# Patient Record
Sex: Female | Born: 1970 | Race: White | Hispanic: No | Smoking: Current every day smoker
Health system: Southern US, Community
[De-identification: ages and names within clinical notes are randomized; demographics above are authoritative.]

## PROBLEM LIST (undated history)

## (undated) DIAGNOSIS — F410 Panic disorder [episodic paroxysmal anxiety] without agoraphobia: Secondary | ICD-10-CM

## (undated) DIAGNOSIS — N809 Endometriosis, unspecified: Secondary | ICD-10-CM

## (undated) DIAGNOSIS — C801 Malignant (primary) neoplasm, unspecified: Secondary | ICD-10-CM

## (undated) DIAGNOSIS — K219 Gastro-esophageal reflux disease without esophagitis: Secondary | ICD-10-CM

## (undated) DIAGNOSIS — D649 Anemia, unspecified: Secondary | ICD-10-CM

## (undated) DIAGNOSIS — F32A Depression, unspecified: Secondary | ICD-10-CM

## (undated) DIAGNOSIS — Z90710 Acquired absence of both cervix and uterus: Secondary | ICD-10-CM

## (undated) DIAGNOSIS — L659 Nonscarring hair loss, unspecified: Secondary | ICD-10-CM

## (undated) DIAGNOSIS — F419 Anxiety disorder, unspecified: Secondary | ICD-10-CM

## (undated) DIAGNOSIS — K529 Noninfective gastroenteritis and colitis, unspecified: Secondary | ICD-10-CM

## (undated) DIAGNOSIS — F329 Major depressive disorder, single episode, unspecified: Secondary | ICD-10-CM

## (undated) DIAGNOSIS — C539 Malignant neoplasm of cervix uteri, unspecified: Secondary | ICD-10-CM

## (undated) HISTORY — PX: DILATION AND CURETTAGE OF UTERUS: SHX78

## (undated) HISTORY — DX: Noninfective gastroenteritis and colitis, unspecified: K52.9

## (undated) HISTORY — DX: Anemia, unspecified: D64.9

## (undated) HISTORY — DX: Endometriosis, unspecified: N80.9

## (undated) HISTORY — PX: OTHER SURGICAL HISTORY: SHX169

## (undated) HISTORY — DX: Depression, unspecified: F32.A

## (undated) HISTORY — DX: Malignant neoplasm of cervix uteri, unspecified: C53.9

## (undated) HISTORY — DX: Malignant (primary) neoplasm, unspecified: C80.1

## (undated) HISTORY — DX: Nonscarring hair loss, unspecified: L65.9

## (undated) HISTORY — PX: WISDOM TOOTH EXTRACTION: SHX21

## (undated) HISTORY — DX: Anxiety disorder, unspecified: F41.9

## (undated) HISTORY — DX: Gastro-esophageal reflux disease without esophagitis: K21.9

## (undated) HISTORY — DX: Acquired absence of both cervix and uterus: Z90.710

## (undated) HISTORY — DX: Panic disorder (episodic paroxysmal anxiety): F41.0

---
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
## 2009-05-06 ENCOUNTER — Ambulatory Visit: Payer: Self-pay | Admitting: Family Medicine

## 2009-05-06 DIAGNOSIS — J019 Acute sinusitis, unspecified: Secondary | ICD-10-CM | POA: Insufficient documentation

## 2009-05-06 DIAGNOSIS — R413 Other amnesia: Secondary | ICD-10-CM

## 2009-08-30 ENCOUNTER — Telehealth: Payer: Self-pay | Admitting: Family Medicine

## 2009-11-06 ENCOUNTER — Ambulatory Visit: Payer: Self-pay | Admitting: Family Medicine

## 2009-11-06 DIAGNOSIS — J069 Acute upper respiratory infection, unspecified: Secondary | ICD-10-CM | POA: Insufficient documentation

## 2009-11-07 ENCOUNTER — Encounter: Payer: Self-pay | Admitting: Family Medicine

## 2009-11-10 ENCOUNTER — Ambulatory Visit: Payer: Self-pay | Admitting: Emergency Medicine

## 2009-11-10 DIAGNOSIS — J209 Acute bronchitis, unspecified: Secondary | ICD-10-CM

## 2009-11-10 DIAGNOSIS — J45901 Unspecified asthma with (acute) exacerbation: Secondary | ICD-10-CM | POA: Insufficient documentation

## 2010-06-27 NOTE — Assessment & Plan Note (Signed)
Summary: COugh-dry, SOB, wheezing x 5 dys rm 3   Vital Signs:  Patient Profile:   40 Years Old Female CC:      Pt states she is not feeling any better x 5 dys Height:     64 inches Weight:      211 pounds O2 Sat:      95 % O2 treatment:    Room Air Temp:     98.2 degrees F oral Pulse rate:   95 / minute Pulse rhythm:   regular Resp:     15 per minute BP sitting:   128 / 81  (right arm) Cuff size:   regular  Vitals Entered By: Areta Haber CMA (November 10, 2009 11:03 AM)                  Current Allergies: ! AVELOX (MOXIFLOXACIN HCL)    History of Present Illness Chief Complaint: Pt states she is not feeling any better x 5 dys History of Present Illness: Patient is not feeling better after 5 days and perhaps worse.  +Sick contacts (son is also sick).  Rx for Prednisone, Zpak, and Advair.  SOB and wheezing and dry cough.  No fever, chills, N/V.  H/o asthma and using rescue inhaler occasionally.   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  Take PC AZITHROMYCIN 250 MG TABS (AZITHROMYCIN) Two tabs by mouth on day 1, then 1 tab daily on days 2 through 5 BENZONATATE 200 MG CAPS (BENZONATATE) One by mouth hs as needed cough  REVIEW OF SYSTEMS Constitutional Symptoms       Complains of fatigue.     Denies fever, chills, night sweats, weight loss, and weight gain.  Eyes       Denies change in vision, eye pain, eye discharge, glasses, contact lenses, and eye surgery. Ear/Nose/Throat/Mouth       Denies hearing loss/aids, change in hearing, ear pain, ear discharge, dizziness, frequent runny nose, frequent nose bleeds, sinus problems, sore  throat, hoarseness, and tooth pain or bleeding.  Respiratory       Complains of dry cough, wheezing, and shortness of breath.      Denies productive cough, asthma, bronchitis, and emphysema/COPD.  Cardiovascular       Denies murmurs, chest pain, and tires easily with exhertion.    Gastrointestinal       Denies stomach pain, nausea/vomiting, diarrhea, constipation, blood in bowel movements, and indigestion. Genitourniary       Denies painful urination, kidney stones, and loss of urinary control. Neurological       Denies paralysis, seizures, and fainting/blackouts. Musculoskeletal       Denies muscle pain, joint pain, joint stiffness, decreased range of motion, redness, swelling, muscle weakness, and gout.  Skin       Denies bruising, unusual mles/lumps or sores, and hair/skin or nail changes.  Psych       Denies mood changes, temper/anger issues, anxiety/stress, speech problems, depression, and sleep problems. Other Comments: x 5 dys. Pt does not have PCP.   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   Non smoker ETOH-yes No Drugs Physical Exam General appearance: well developed, well nourished, no acute distress Ears: normal, no lesions or deformities Nasal: mucosa pink, nonedematous, no septal deviation, turbinates normal Oral/Pharynx: tongue normal, posterior pharynx without erythema or exudate but with PND Chest/Lungs: Bilateral generalized rhonchi and wheezing Heart: regular rate and  rhythm, no murmur Neurological: grossly intact and non-focal Skin: no obvious rashes or lesions Assessment New Problems: ACUTE BRONCHITIS (ICD-466.0) ASTHMA UNSPECIFIED WITH EXACERBATION  (UXL-244.01)   The patient and/or caregiver has been counseled thoroughly with regard to medications prescribed including dosage, schedule, interactions, rationale for use, and possible side effects and they verbalize understanding.  Diagnoses and expected course of recovery discussed and will return if not improved as expected or if the condition worsens. Patient and/or caregiver verbalized understanding.   Patient Instructions: 1)  Suggest Mucinex two times a day to help loosen secretions.  Rescue albuterol inhaler every 4-6 hours as needed.  Rest and hydration encouraged.  If not improving or if problem recurs, will need to see pulmonologist to maximize asthma treatment.  If you develop fevers and/or worsening symptoms, return for a chest Xray.  Complete all medicines previously prescribed.  Medication Administration  Injection # 1:    Medication: Rocephin  250mg     Diagnosis: ACUTE BRONCHITIS (ICD-466.0)    Route: IM    Site: RUOQ gluteus    Exp Date: 06/27/2012    Lot #: [id number]    Mfr: Sandoux    Comments: Administered 1 gram    Patient tolerated injection without complications    Given by: Areta Haber CMA (November 10, 2009 12:03 PM)  Injection # 2:    Medication: Solumedrol up to 125mg     Diagnosis: ACUTE BRONCHITIS (ICD-466.0)    Route: IM    Site: LUOQ gluteus    Exp Date: 02/25/2012    Lot #: [id number]    Mfr: Pharmacia    Comments: Administered 125mg     Patient tolerated injection without complications    Given by: Areta Haber CMA (November 10, 2009 12:04 PM)  Medication # 1:    Medication: Albuterol Sulfate Sol 1mg  unit dose    Diagnosis: URI (ICD-465.9)    Dose: 0.5    Route: inhaled    Exp Date: 07/27/2010    Lot #: [id number]    Mfr: Mylan    Patient tolerated medication without complications    Given by: Emilio Math (November 10, 2009 11:24 AM)  Medication # 2:    Medication: Ipratropium inhalation sol. unit dose    Diagnosis: URI (ICD-465.9)    Dose: 3.0     Route: inhaled    Exp Date: 07/27/2010    Lot #: [id number]    Mfr: Mylan    Given by: Emilio Math (November 10, 2009 11:24 AM)  Orders Added: 1)  Albuterol Sulfate Sol 1mg  unit dose [H4742] 2)  Ipratropium inhalation sol. unit dose [J7644] 3)  Nebulizer Tx [94640] 4)  New Patient Level III [99203] 5)  New Patient Level III [99203] 6)  Rocephin  250mg  [J0696] 7)  Solumedrol up to 125mg  [J2930] 8)  Admin of Therapeutic Inj  intramuscular or subcutaneous [96372]   Medication Administration  Injection # 1:    Medication: Rocephin  250mg     Diagnosis: ACUTE BRONCHITIS (ICD-466.0)    Route: IM    Site: RUOQ gluteus    Exp Date:  06/27/2012    Lot #: [id number]    Mfr: Sandoux    Comments: Administered 1 gram    Patient tolerated injection without complications    Given by: Areta Haber CMA (November 10, 2009 12:03 PM)  Injection # 2:    Medication: Solumedrol up to 125mg     Diagnosis: ACUTE BRONCHITIS (ICD-466.0)    Route: IM    Site: LUOQ gluteus    Exp Date: 02/25/2012    Lot #: [id number]    Mfr: Pharmacia    Comments: Administered 125mg     Patient tolerated injection without complications    Given by: Areta Haber CMA (November 10, 2009 12:04 PM)  Medication # 1:    Medication: Albuterol Sulfate Sol 1mg  unit dose    Diagnosis: URI (ICD-465.9)    Dose: 0.5    Route: inhaled    Exp Date: 07/27/2010    Lot #: [id number]    Mfr: Mylan    Patient tolerated medication without complications    Given by: Emilio Math (November 10, 2009 11:24 AM)  Medication # 2:    Medication: Ipratropium inhalation sol. unit dose    Diagnosis: URI (ICD-465.9)    Dose: 3.0    Route: inhaled    Exp Date: 07/27/2010    Lot #: [id number]    Mfr: Mylan    Given by: Emilio Math (November 10, 2009 11:24 AM)  Orders Added: 1)  Albuterol Sulfate Sol 1mg  unit dose [J4782] 2)  Ipratropium inhalation sol. unit dose [J7644] 3)  Nebulizer Tx [94640] 4)  New Patient Level III [99203] 5)  New Patient Level III  [99203] 6)  Rocephin  250mg  [J0696] 7)  Solumedrol up to 125mg  [J2930] 8)  Admin of Therapeutic Inj  intramuscular or subcutaneous [95621]

## 2010-06-27 NOTE — Assessment & Plan Note (Signed)
Summary: COUGH/CONGESTION/TM   Vital Signs:  Patient Profile:   40 Years Old Female CC:      Cough, congestion x 2 days Height:     64 inches Weight:      204 pounds O2 Sat:      98 % O2 treatment:    Room Air Temp:     98.8 degrees F oral Pulse rate:   94 / minute Pulse rhythm:   regular Resp:     16 per minute BP sitting:   140 / 82  (right arm) Cuff size:   regular  Vitals Entered By: Emilio Math (November 06, 2009 2:32 PM)                  Current Allergies (reviewed today): ! AVELOX (MOXIFLOXACIN HCL)History of Present Illness Chief Complaint: Cough, congestion x 2 days History of Present Illness: Subjective: Patient complains of onset of URI symptoms 2 days ago with scratchy throat, nasal congestion, and non-productive cough.  She has a history of mild seasonal asthma, and has had several episodes of bronchitis in the past.  She has Advair at home but not presently using it.   No pleuritic pain No wheezing + post-nasal drainage ? sinus pain/pressure No itchy/red eyes No earache but ears feel clogged. No hemoptysis ? SOB ? fever/chills No nausea No vomiting No abdominal pain No diarrhea No skin rashes + fatigue No myalgias No headache Used OTC meds without relief   Current Meds ADVAIR DISKUS 100-50 MCG/DOSE MISC (FLUTICASONE-SALMETEROL) 1 inhalation two times a day PREDNISONE 10 MG TABS (PREDNISONE) 2 PO BID for 2 days, then 1 BID for 2 days, then 1 daily for 2 days.  Take PC AZITHROMYCIN 250 MG TABS (AZITHROMYCIN) Two tabs by mouth on day 1, then 1 tab daily on days 2 through 5 BENZONATATE 200 MG CAPS (BENZONATATE) One by mouth hs as needed cough  REVIEW OF SYSTEMS Constitutional Symptoms      Denies fever, chills, night sweats, weight loss, weight gain, and fatigue.  Eyes       Denies change in vision, eye pain, eye discharge, glasses, contact lenses, and eye surgery. Ear/Nose/Throat/Mouth       Complains of sinus problems.      Denies hearing  loss/aids, change in hearing, ear pain, ear discharge, dizziness, frequent runny nose, frequent nose bleeds, sore throat, hoarseness, and tooth pain or bleeding.  Respiratory       Complains of productive cough and asthma.      Denies dry cough, wheezing, shortness of breath, bronchitis, and emphysema/COPD.  Cardiovascular       Denies murmurs, chest pain, and tires easily with exhertion.    Gastrointestinal       Denies stomach pain, nausea/vomiting, diarrhea, constipation, blood in bowel movements, and indigestion. Genitourniary       Denies painful urination, kidney stones, and loss of urinary control. Neurological       Denies paralysis, seizures, and fainting/blackouts. Musculoskeletal       Denies muscle pain, joint pain, joint stiffness, decreased range of motion, redness, swelling, muscle weakness, and gout.  Skin       Denies bruising, unusual mles/lumps or sores, and hair/skin or nail changes.  Psych       Denies mood changes, temper/anger issues, anxiety/stress, speech problems, depression, and sleep problems.  Past History:  Past Medical History: Reviewed history from 05/06/2009 and no changes required. 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   Non smoker ETOH-yes No Drugs   Objective:  No acute distress  Eyes:  Pupils are equal, round, and reactive to light and accomdation.  Extraocular movement is intact.  Conjunctivae are not inflamed.  Ears:  Canals normal.  Tympanic membranes normal.   Nose:  Normal septum.  Normal turbinates, mildly congested.   No sinus tenderness present.  Pharynx:  Mildly erythematous Neck:  Supple.   Tender shotty anterior  nodes are palpated bilaterally.  Lungs:  Clear to auscultation.  Breath sounds are equal. j Heart:  Regular rate and rhythm without murmurs, rubs, or gallops.    Abdomen:  Nontender without masses or hepatosplenomegaly.  Bowel sounds are present.  No CVA or flank tenderness.  Extremities:  No edema.  Skin:  No rash Rapid strep test negative  Assessment New Problems: URI (ICD-465.9)  VIRAL URI WITH BRONCHOSPASM.  NO EVIDENCE BACTERIAL INFECTION AT THIS TIME  Plan New Medications/Changes: BENZONATATE 200 MG CAPS (BENZONATATE) One by mouth hs as needed cough  #12 x 0, 11/06/2009, Donna Christen MD AZITHROMYCIN 250 MG TABS (AZITHROMYCIN) Two tabs by mouth on day 1, then 1 tab daily on days 2 through 5  #6 tabs x 0, 11/06/2009, Donna Christen MD PREDNISONE 10 MG TABS (PREDNISONE) 2 PO BID for 2 days, then 1 BID for 2 days, then 1 daily for 2 days.  Take PC  #14 x 0, 11/06/2009, Donna Christen MD ADVAIR DISKUS 100-50 MCG/DOSE MISC (FLUTICASONE-SALMETEROL) 1 inhalation two times a day  #1 disp pk 60 x 0, 11/06/2009, Donna Christen MD  New Orders: Rapid Strep [14782] T-Culture, Throat [[id number]] New Patient Level III [99203] Planning Comments:   Throat culture pending. Treat symptomatically for now:  expectorant/decongestant, cough suppressant at bedtime, increased fluids. DepoMedrol 80mg  IM, then begin prednisone taper. Resume Advair and continue for about 2 weeks. If not improving, or if fever persists add Z-pack (Rx to hold.  Also begin Z-pack if throat culture positive) Follow-up with PCP if not improving about 10 days.   The patient and/or caregiver has been counseled thoroughly with regard to medications prescribed including dosage, schedule, interactions, rationale for use, and possible side effects and they verbalize understanding.  Diagnoses and expected course of recovery discussed and will return if not improved as expected or if the condition worsens. Patient and/or caregiver verbalized understanding.  Prescriptions: BENZONATATE 200 MG CAPS (BENZONATATE) One by mouth hs as needed cough  #12 x 0   Entered and Authorized by:   Donna Christen MD   Signed by:   Donna Christen MD on 11/06/2009   Method used:   Print then Give to Patient   RxID:   [id number] AZITHROMYCIN 250 MG TABS (AZITHROMYCIN) Two tabs by mouth on day 1, then 1 tab daily on days 2 through 5  #6 tabs x 0   Entered and Authorized by:   Donna Christen MD   Signed by:   Donna Christen MD on 11/06/2009   Method used:   Print then Give to Patient   RxID:   [id number] PREDNISONE 10 MG TABS (PREDNISONE) 2 PO BID for 2 days, then 1 BID for 2 days, then 1 daily for 2 days.  Take PC  #14 x 0   Entered and Authorized by:   Donna Christen MD   Signed by:   Donna Christen MD on 11/06/2009   Method used:   Print then  Give to Patient   RxID:   [id number] ADVAIR DISKUS 100-50 MCG/DOSE MISC (FLUTICASONE-SALMETEROL) 1 inhalation two times a day  #1 disp pk 60 x 0   Entered and Authorized by:   Donna Christen MD   Signed by:   Donna Christen MD on 11/06/2009   Method used:   Print then Give to Patient   RxID:   [id number]   Patient Instructions: 1)  May use Mucinex D (guaifenesin with decongestant) twice daily for congestion. 2)  Increase fluid intake, rest. 3)  Resume using Advair. 4)  May use Afrin nasal spray (or generic oxymetazoline) twice daily for about 5 days.  Also recommend using saline nasal spray several times daily and/or saline nasal irrigation. 5)  Add Azithromycin if not improving 5 to 7 days or if persistent fever developes 6)  Followup with family doctor if not improving 7 to 10 days.  Orders Added: 1)  Rapid Strep [87880] 2)  T-Culture, Throat [[id number]] 3)  New Patient Level III [01027]  Appended Document: COUGH/CONGESTION/TM Depo Medrol 80 mgs given 0A5P1 11/2010

## 2010-06-27 NOTE — Progress Notes (Signed)
Summary: UTI sxs  Phone Note Call from Patient   Summary of Call: BAck pain not feeling well. + dysuria. Not having period.  No fever.   Initial call taken by: Nani Gasser MD,  August 30, 2009 1:40 PM  Follow-up for Phone Call        UA + for blood and LE, brown and cloudy. Will treat for UTI. If not better in 3 days then repeat UA and send a culture.  Follow-up by: Nani Gasser MD,  August 30, 2009 1:40 PM    New/Updated Medications: BACTRIM DS 800-160 MG TABS (SULFAMETHOXAZOLE-TRIMETHOPRIM) Take 1 tablet by mouth two times a day for 3 days Prescriptions: BACTRIM DS 800-160 MG TABS (SULFAMETHOXAZOLE-TRIMETHOPRIM) Take 1 tablet by mouth two times a day for 3 days  #6 x 0   Entered and Authorized by:   Nani Gasser MD   Signed by:   Nani Gasser MD on 08/30/2009   Method used:   Electronically to        [facility] [phone]* (retail)       [address] [place], [place]  [address]       Ph: [phone] or [phone]       Fax: [phone]   RxID:   [id number]

## 2010-08-27 ENCOUNTER — Encounter: Payer: Self-pay | Admitting: Family Medicine

## 2010-08-31 ENCOUNTER — Ambulatory Visit (INDEPENDENT_AMBULATORY_CARE_PROVIDER_SITE_OTHER): Payer: BC Managed Care – PPO | Admitting: Family Medicine

## 2010-08-31 ENCOUNTER — Encounter: Payer: Self-pay | Admitting: Family Medicine

## 2010-08-31 DIAGNOSIS — Z8349 Family history of other endocrine, nutritional and metabolic diseases: Secondary | ICD-10-CM

## 2010-08-31 DIAGNOSIS — Z862 Personal history of diseases of the blood and blood-forming organs and certain disorders involving the immune mechanism: Secondary | ICD-10-CM

## 2010-08-31 DIAGNOSIS — N809 Endometriosis, unspecified: Secondary | ICD-10-CM

## 2010-08-31 DIAGNOSIS — R5383 Other fatigue: Secondary | ICD-10-CM

## 2010-08-31 NOTE — Progress Notes (Signed)
  Subjective:    Patient ID: Virginia Logan, female    DOB: 1971-04-07, 40 y.o.   MRN: [id number]  HPI She would like to be checked for diabetes.  Occ inc thirst and urination. + family hx of diabeties. No shakes or sweats.   She is very concerned about her weight.  No regular exercise.  Has gained about 20 lbs in the last year.  Thought she feels she is very active. Father with Graves dz.  She has been drinking more water and staying off the soft drink.  She is working a really long schedule.  She is very fatigued. She is working really long hours.  Was on zoloft for a long time but then felt like it was not working well. Now she is on pristiq and taht is working well for the most part.  She likes the pristiq better than the zoloft.    Does have a hx of heavy periods.  Hx of anemia as a teenager. She has been skipping dinner when she works and often eating only 2 x a day.    Review of Systems     Objective:   Physical Exam  Constitutional: She appears well-developed and well-nourished.  HENT:  Head: Normocephalic and atraumatic.  Eyes: Pupils are equal, round, and reactive to light.  Neck: Normal range of motion. No thyromegaly present.  Cardiovascular: Normal rate and regular rhythm.   Pulmonary/Chest: Effort normal and breath sounds normal.  Lymphadenopathy:    She has no cervical adenopathy.  Skin: Skin is warm and dry.  Psychiatric: She has a normal mood and affect.          Assessment & Plan:  Weight loss - Discussed regular exercise, healthy diet. REcommend one week food diary to evaluate how often and how many calories she is eating. I discussed the importance of 5 small meals a day and not skipping dinner. Also we can consider weight loss meds but discussed the pros and cons of this.   Fatigue - Will rule out anemia, thyroid d/o, etc. She is working really long hours which is likely contributing. Mood is fari. Consider may need an increase on her pristiq.

## 2010-08-31 NOTE — Patient Instructions (Signed)
We will call you with the lab results. 

## 2010-09-13 ENCOUNTER — Other Ambulatory Visit: Payer: Self-pay | Admitting: Family Medicine

## 2010-09-13 ENCOUNTER — Telehealth: Payer: Self-pay | Admitting: Family Medicine

## 2010-09-13 NOTE — Telephone Encounter (Signed)
Patient walk-in this morning request B-12 to be added to her lab order.

## 2010-09-13 NOTE — Telephone Encounter (Signed)
Message left for pt that B12 level was ordered by Dr. Linford Arnold at her last visit.  Pt should call if she has any further questions.

## 2010-09-14 ENCOUNTER — Telehealth: Payer: Self-pay | Admitting: Family Medicine

## 2010-09-14 LAB — COMPLETE METABOLIC PANEL WITH GFR
Albumin: 4.3 g/dL (ref 3.5–5.2)
Alkaline Phosphatase: 98 U/L (ref 39–117)
BUN: 15 mg/dL (ref 6–23)
CO2: 20 mEq/L (ref 19–32)
Calcium: 9.8 mg/dL (ref 8.4–10.5)
GFR, Est African American: >60 mL/min (ref >60)
GFR, Est Non African American: >60 mL/min (ref >60)
Glucose, Bld: 122 mg/dL — ABNORMAL HIGH (ref 70–99)
Potassium: 4.3 mEq/L (ref 3.5–5.3)
Sodium: 137 mEq/L (ref 135–145)
Total Protein: 7.2 g/dL (ref 6.0–8.3)

## 2010-09-14 LAB — CBC
MCHC: 32.8 g/dL (ref 30.0–36.0)
Platelets: 278 10*3/uL (ref 150–400)
RDW: 13.9 % (ref 11.5–15.5)
WBC: 7.9 10*3/uL (ref 4.0–10.5)

## 2010-09-14 LAB — VITAMIN B12: Vitamin B-12: 399 pg/mL (ref 211–911)

## 2010-09-14 LAB — VITAMIN D 25 HYDROXY (VIT D DEFICIENCY, FRACTURES): Vit D, 25-Hydroxy: 37 ng/mL (ref 30–89)

## 2010-09-14 LAB — IRON: Iron: 78 ug/dL (ref 42–145)

## 2010-09-14 NOTE — Telephone Encounter (Signed)
Call pt: all labs look great except sugar was elevated, but I dont' think she was fasting. Normal B12, no anemia, nl thyroid.  For weight loss recommend increase activity/exercise. No skipping meals.  We can always consider a wt loss med but that has to be in conjuntion with exercise and diet plan. I also think her long hours have increased her bodies normal cortisol levels adn this can cause wt gain.

## 2010-09-14 NOTE — Telephone Encounter (Signed)
Call and left message to call back for labs

## 2010-09-15 MED ORDER — PHENTERMINE HCL 37.5 MG PO CAPS: 37.5000 mg | ORAL_CAPSULE | ORAL | Status: DC

## 2010-09-15 NOTE — Telephone Encounter (Signed)
OK we will fax med to pharmacy. F/U in one month for weight check on the med and also so we can do an A1C to test for diabetes  (appt w/ MD).

## 2010-09-15 NOTE — Telephone Encounter (Signed)
Left message on pts cell

## 2010-09-15 NOTE — Telephone Encounter (Signed)
Pt notified and pt states she was fasting when she had labs done.Pt would like to start wt loss med along with diet plan

## 2010-10-19 ENCOUNTER — Other Ambulatory Visit (INDEPENDENT_AMBULATORY_CARE_PROVIDER_SITE_OTHER): Payer: BC Managed Care – PPO | Admitting: Family Medicine

## 2010-10-19 ENCOUNTER — Telehealth: Payer: Self-pay | Admitting: *Deleted

## 2010-10-19 DIAGNOSIS — E119 Type 2 diabetes mellitus without complications: Secondary | ICD-10-CM

## 2010-10-19 NOTE — Progress Notes (Signed)
  Subjective:    Patient ID: Virginia Logan, female    DOB: Aug 22, 1970, 40 y.o.   MRN: [id number]  HPI Overweight. Tryign to loose weight  Also here for A1C check since her labs revealed and abnormal glucose level.    Review of Systems     Objective:   Physical Exam        Assessment & Plan:  Ovwerweight - has lost 6 lbs which is fantastic.    Abnormal glucose - A1C is OK. Recheck fasting glucose in 6 months.

## 2010-10-19 NOTE — Telephone Encounter (Signed)
Pt was in the office this am and states pristiquw is too expensive for her. Pt was given samples today but she wanted to know if there was a cheaper alternative

## 2010-10-19 NOTE — Telephone Encounter (Signed)
Rep dropped off coupon cards that make it 15 a month. This is pretty good.  Can pick one up any time.

## 2010-10-20 NOTE — Telephone Encounter (Signed)
Pt was notified yesterday.

## 2010-10-24 ENCOUNTER — Other Ambulatory Visit: Payer: Self-pay | Admitting: *Deleted

## 2010-10-24 MED ORDER — PHENTERMINE HCL 37.5 MG PO CAPS: 37.5000 mg | ORAL_CAPSULE | ORAL | Status: DC

## 2010-10-25 ENCOUNTER — Telehealth: Payer: Self-pay | Admitting: Family Medicine

## 2010-10-25 NOTE — Telephone Encounter (Signed)
Left message on pt.'s vm.

## 2010-10-25 NOTE — Telephone Encounter (Signed)
Call pt: Please call patient. Normal mammogram.  Repeat in 1 year.

## 2010-11-27 ENCOUNTER — Ambulatory Visit (INDEPENDENT_AMBULATORY_CARE_PROVIDER_SITE_OTHER): Payer: BC Managed Care – PPO | Admitting: Family Medicine

## 2010-11-27 VITALS — BP 130/87 | HR 72 | Wt 199.0 lb

## 2010-11-27 DIAGNOSIS — E663 Overweight: Secondary | ICD-10-CM

## 2010-11-27 MED ORDER — PHENTERMINE HCL 37.5 MG PO CAPS: 37.5000 mg | ORAL_CAPSULE | ORAL | Status: DC

## 2010-11-27 NOTE — Progress Notes (Signed)
  Subjective:    Patient ID: Virginia Logan, female    DOB: 06-06-1970, 40 y.o.   MRN: [id number]  HPI Her to f/u weight loss.  Here for BP and wt check. Has only lost 3 lbs.     Review of Systems     Objective:   Physical Exam        Assessment & Plan:  OK to refill her phentermine.

## 2010-11-30 ENCOUNTER — Ambulatory Visit: Payer: BC Managed Care – PPO | Admitting: Family Medicine

## 2010-12-04 ENCOUNTER — Ambulatory Visit: Payer: BC Managed Care – PPO | Admitting: Family Medicine

## 2010-12-27 ENCOUNTER — Other Ambulatory Visit: Payer: Self-pay | Admitting: Family Medicine

## 2010-12-27 ENCOUNTER — Encounter: Payer: Self-pay | Admitting: Family Medicine

## 2010-12-27 ENCOUNTER — Ambulatory Visit (INDEPENDENT_AMBULATORY_CARE_PROVIDER_SITE_OTHER): Payer: BC Managed Care – PPO | Admitting: Family Medicine

## 2010-12-27 VITALS — BP 127/80 | HR 83 | Temp 98.4°F | Wt 196.0 lb

## 2010-12-27 DIAGNOSIS — R1031 Right lower quadrant pain: Secondary | ICD-10-CM

## 2010-12-27 LAB — CBC WITH DIFFERENTIAL/PLATELET
Hemoglobin: 15.7 g/dL — ABNORMAL HIGH (ref 12.0–15.0)
MCH: 29.2 pg (ref 26.0–34.0)
MCHC: 34 g/dL (ref 30.0–36.0)
Monocytes Relative: 3 % (ref 3–12)
Neutrophils Relative %: 64 % (ref 43–77)
RDW: 14.2 % (ref 11.5–15.5)

## 2010-12-27 LAB — COMPLETE METABOLIC PANEL WITH GFR
CO2: 27 mEq/L (ref 19–32)
Creat: 0.8 mg/dL (ref 0.50–1.10)
GFR, Est African American: >60 mL/min (ref >60)
GFR, Est Non African American: >60 mL/min (ref >60)
Glucose, Bld: 110 mg/dL — ABNORMAL HIGH (ref 70–99)
Total Bilirubin: 0.5 mg/dL (ref 0.3–1.2)

## 2010-12-27 NOTE — Progress Notes (Signed)
Subjective:    Patient ID: Virginia Logan, female    DOB: [birth year], 40 y.o.   MRN: [id number]  HPI  Patient says about 2 weeks ago she had an episode of diarrhea. It was after eating a salad that she thought might be contaminated she thought initially it might be food poisoning. She did get better but then starting last week she started having a discomfort in her right lower quadrant that she noticed when she would bend over while cleaning. Developed a sharp hurting sensation when she would bend forward. Otherwise it didn't bother her. Then over the last couple of days the pain has become more prominent and constant. She now rates her pain as 8/10. She also had recurrent diarrhea that started again yesterday with painful cramping. The cramping and the diarrhea have now resolved. She did use a medium. She has not had any vomiting or nausea but has had some chills and sweats. She's not sure she's had a true fever. Note she does have a family history in her brother of Crohn's disease. She denies any blood in her stool. She has not taken any other medications.  Review of Systems  BP 127/80  Pulse 83  Temp(Src) 98.4 F (36.9 C) (Oral)  Wt 196 lb (88.905 kg)    Allergies  Allergen Reactions  . Moxifloxacin     REACTION: Rash    Past Medical History  Diagnosis Date  . Anemia     Past Surgical History  Procedure Date  . Left ankle     History   Social History  . Marital Status: Married    Spouse Name: N/A    Number of Children: N/A  . Years of Education: N/A   Occupational History  . Not on file.   Social History Main Topics  . Smoking status: Current Everyday Smoker -- 0.3 packs/day    Types: Cigarettes  . Smokeless tobacco: Not on file  . Alcohol Use: Yes  . Drug Use: No  . Sexually Active: Not on file   Other Topics Concern  . Not on file   Social History Narrative  . No narrative on file    Family History  Problem Relation Age of Onset  . Diabetes Mother   .  Hyperlipidemia Mother   . Heart attack Mother   . Cancer Father     prostate  . Graves' disease Father   . Crohn's disease Brother     Virginia Logan does not currently have medications on file.     Objective:   Physical Exam  Constitutional: She is oriented to person, place, and time. She appears well-developed and well-nourished.  HENT:  Head: Normocephalic and atraumatic.  Cardiovascular: Normal rate, regular rhythm and normal heart sounds.   Pulmonary/Chest: Effort normal and breath sounds normal.  Abdominal: Soft. Bowel sounds are normal. She exhibits no distension and no mass. There is tenderness. There is no rebound and no guarding.       Tender in teh RLQ and teh LUQ midly.   Neurological: She is alert and oriented to person, place, and time.  Skin: Skin is warm and dry.  Psychiatric: She has a normal mood and affect.          Assessment & Plan:  RLQ pain - consider colitis, appendicitis, diverticulitis, versus a cyst on her ovary. She's not currently having any urinary symptoms. Typically with diverticulitis the pain is usually worse on the left. She does still have her appendix and her pain  is actually on the right and at the level of the umbilicus. We'll get a stat CBC today and set her up for a CT of the abdomen and pelvis with contrast. She declined an injection of Toradol for pain relief. She says she does plan to go to work today. She also does have a history of endometriosis which certainly could be contributing. I really want her to work on staying hydrated right now. We will also get a CMP to make sure there is no elevation of liver enzymes abnormalities in her electrolytes and she did have persistent severe diarrhea yesterday.

## 2010-12-28 ENCOUNTER — Telehealth: Payer: Self-pay | Admitting: Family Medicine

## 2010-12-28 ENCOUNTER — Ambulatory Visit: Payer: BC Managed Care – PPO

## 2010-12-28 NOTE — Telephone Encounter (Signed)
Called and left message on cell phone with the results of her CT scan. It was otherwise normal. She did have some kidney stones but they're nonobstructing and actually in the kidney which should not be causing any of her pain her problem. I wanted her to give me a call back if she still having pain so that we can set her up with a gastroenterologist as I still don't have a good cause for her discomfort.

## 2010-12-29 ENCOUNTER — Other Ambulatory Visit: Payer: Self-pay | Admitting: Family Medicine

## 2010-12-29 DIAGNOSIS — R1031 Right lower quadrant pain: Secondary | ICD-10-CM

## 2010-12-29 MED ORDER — PHENTERMINE HCL 37.5 MG PO CAPS: 37.5000 mg | ORAL_CAPSULE | ORAL | Status: DC

## 2010-12-29 NOTE — Progress Notes (Signed)
Comment discuss results with patient. I do recommend she see GI since she is overall better but is still having that right-sided pain in the same spot especially when she bends over she still noticing it. The she's no longer having diarrhea or stomach cramping. She does have a family history of Crohn's disease which usually is not hereditary but I would still like her to consider seeing GI.

## 2011-01-04 ENCOUNTER — Encounter: Payer: Self-pay | Admitting: Family Medicine

## 2011-01-30 ENCOUNTER — Telehealth: Payer: Self-pay | Admitting: *Deleted

## 2011-01-30 MED ORDER — SERTRALINE HCL 100 MG PO TABS: 100.0000 mg | ORAL_TABLET | Freq: Every day | ORAL | Status: DC

## 2011-01-30 NOTE — Telephone Encounter (Signed)
Pt calls and states she can not afford the Pristiq any longer as she does not have insurance and wanted to know if you could put her back on the Zoloft. Please send to her pharmacy

## 2011-01-30 NOTE — Telephone Encounter (Signed)
Will change to zoloft 

## 2011-09-01 ENCOUNTER — Other Ambulatory Visit: Payer: Self-pay | Admitting: Family Medicine

## 2011-10-25 ENCOUNTER — Other Ambulatory Visit: Payer: Self-pay | Admitting: *Deleted

## 2011-10-25 MED ORDER — SERTRALINE HCL 100 MG PO TABS: 100.0000 mg | ORAL_TABLET | Freq: Every day | ORAL | Status: DC

## 2011-12-10 ENCOUNTER — Other Ambulatory Visit: Payer: Self-pay | Admitting: Family Medicine

## 2012-03-11 ENCOUNTER — Other Ambulatory Visit: Payer: Self-pay | Admitting: Family Medicine

## 2012-03-11 NOTE — Telephone Encounter (Signed)
Must make appointment 

## 2012-04-11 ENCOUNTER — Encounter: Payer: Self-pay | Admitting: Family Medicine

## 2012-04-11 ENCOUNTER — Ambulatory Visit (INDEPENDENT_AMBULATORY_CARE_PROVIDER_SITE_OTHER): Payer: Managed Care, Other (non HMO) | Admitting: Family Medicine

## 2012-04-11 VITALS — BP 133/84 | HR 75 | Temp 98.0°F | Ht 64.0 in | Wt 211.0 lb

## 2012-04-11 DIAGNOSIS — Z23 Encounter for immunization: Secondary | ICD-10-CM

## 2012-04-11 DIAGNOSIS — J329 Chronic sinusitis, unspecified: Secondary | ICD-10-CM

## 2012-04-11 MED ORDER — AZITHROMYCIN 250 MG PO TABS: ORAL_TABLET | ORAL | Status: DC

## 2012-04-11 NOTE — Patient Instructions (Signed)

## 2012-04-11 NOTE — Progress Notes (Signed)
  Subjective:    Patient ID: Virginia Logan, female    DOB: 1970/11/11, 41 y.o.   MRN: [id number]  HPI Pain and congestion on the left side of nose x 5 days.  No fever.  Now ear pain and ST on the at side.     Review of Systems     Objective:   Physical Exam  Constitutional: She is oriented to person, place, and time. She appears well-developed and well-nourished.  HENT:  Head: Normocephalic and atraumatic.  Right Ear: External ear normal.  Left Ear: External ear normal.  Nose: Nose normal.  Mouth/Throat: Oropharynx is clear and moist.       TMs and canals are clear. + tender over the left maxillary sinuses  Eyes: Conjunctivae normal and EOM are normal. Pupils are equal, round, and reactive to light.  Neck: Neck supple. No thyromegaly present.  Cardiovascular: Normal rate, regular rhythm and normal heart sounds.   Pulmonary/Chest: Effort normal and breath sounds normal. She has no wheezes.  Lymphadenopathy:    She has no cervical adenopathy.  Neurological: She is alert and oriented to person, place, and time.  Skin: Skin is warm and dry.  Psychiatric: She has a normal mood and affect.          Assessment & Plan:  Sinusitis - likely bacterial.  Will tx with zpack.  Call if not better in one week.  Continue symptomatic care. Has flonase at home. Will restart it.

## 2012-04-16 ENCOUNTER — Other Ambulatory Visit: Payer: Self-pay | Admitting: Family Medicine

## 2012-05-20 ENCOUNTER — Other Ambulatory Visit: Payer: Self-pay | Admitting: Family Medicine

## 2012-06-12 ENCOUNTER — Encounter: Payer: Self-pay | Admitting: Family Medicine

## 2012-06-12 ENCOUNTER — Ambulatory Visit (INDEPENDENT_AMBULATORY_CARE_PROVIDER_SITE_OTHER): Payer: Managed Care, Other (non HMO) | Admitting: Family Medicine

## 2012-06-12 VITALS — BP 114/82 | HR 97 | Resp 18 | Ht 65.25 in | Wt 211.0 lb

## 2012-06-12 DIAGNOSIS — J329 Chronic sinusitis, unspecified: Secondary | ICD-10-CM

## 2012-06-12 DIAGNOSIS — Z0000 Encounter for general adult medical examination without abnormal findings: Secondary | ICD-10-CM

## 2012-06-12 DIAGNOSIS — N926 Irregular menstruation, unspecified: Secondary | ICD-10-CM

## 2012-06-12 DIAGNOSIS — F341 Dysthymic disorder: Secondary | ICD-10-CM

## 2012-06-12 DIAGNOSIS — F418 Other specified anxiety disorders: Secondary | ICD-10-CM

## 2012-06-12 LAB — COMPLETE METABOLIC PANEL WITH GFR
ALT: 14 U/L (ref 0–35)
Albumin: 4.2 g/dL (ref 3.5–5.2)
CO2: 21 mEq/L (ref 19–32)
Calcium: 9.5 mg/dL (ref 8.4–10.5)
Chloride: 107 mEq/L (ref 96–112)
GFR, Est African American: >89 mL/min
GFR, Est Non African American: >89 mL/min
Glucose, Bld: 95 mg/dL (ref 70–99)
Potassium: 4.4 mEq/L (ref 3.5–5.3)
Sodium: 138 mEq/L (ref 135–145)
Total Protein: 7 g/dL (ref 6.0–8.3)

## 2012-06-12 LAB — LIPID PANEL: HDL: 51 mg/dL (ref >39)

## 2012-06-12 MED ORDER — SERTRALINE HCL 25 MG PO TABS: ORAL_TABLET | ORAL | Status: DC

## 2012-06-12 MED ORDER — PAROXETINE HCL 20 MG PO TABS: ORAL_TABLET | ORAL | Status: DC

## 2012-06-12 MED ORDER — AMOXICILLIN 875 MG PO TABS: 875.0000 mg | ORAL_TABLET | Freq: Two times a day (BID) | ORAL | Status: DC

## 2012-06-12 NOTE — Progress Notes (Signed)
  Subjective:    Patient ID: Virginia Logan, female    DOB: [birth year], 42 y.o.   MRN: [id number]  HPI Was having some allergy sxs last thrusday.  Has had cough, fever and left ear pain.  Low grade temp last night.  Taking Mucinex - D and flonase last night.  IBU for fever.  No GI sxs.  Has had her flu shot.  She has had several family members have been sick as well. She also has a sore throat. Worse on the left.  Depression/Anxiety- She feels like she is going through menopause. Says her anxiety level is worse around her periods. Has been on zoloft since her son was born and had post partum depression. Says memory has been worse. Still having periods.  Has been more tired and less motivated.  No suicidal thoughts.  She has a very supportive husband or church. She used to take paxil and says it worked well and would like to try it again.  She's been having to take care of her ailing parents. Also her father-in-law passed away last year. She does have a lot of very stressful events in the last year. She says she still struggling to get over it emotionally but feels like she's getting her in nose it will just take time.  Review of Systems     Objective:   Physical Exam  Constitutional: She is oriented to person, place, and time. She appears well-developed and well-nourished.  HENT:  Head: Normocephalic and atraumatic.  Right Ear: External ear normal.  Left Ear: External ear normal.  Nose: Nose normal.  Mouth/Throat: Oropharynx is clear and moist.       TMs and canals are clear.   Eyes: Conjunctivae normal and EOM are normal. Pupils are equal, round, and reactive to light.  Neck: Neck supple. No thyromegaly present.  Cardiovascular: Normal rate, regular rhythm and normal heart sounds.   Pulmonary/Chest: Effort normal and breath sounds normal. She has no wheezes.       Rhonchi on the right that cleared with cough.   Lymphadenopathy:    She has no cervical adenopathy.  Neurological: She is  alert and oriented to person, place, and time.  Skin: Skin is warm and dry.  Psychiatric: She has a normal mood and affect.          Assessment & Plan:  Sinusitis  X 7 days. Given rx for amox if not better in 2-3 days or can fill earlier if feels getting worse.  If she does feel the am not convinced call back if not better in one week. Continue symptomatic care. Continue Flonase.  Depression/Anxiety - Some change in sxs may be related to going through menopause. We discussed the potential for taking her hormone levels. They do tend to fluctuate so they can be completely normal especially is going through perimenopause but we will start by checking that. We will also wean down the sertraline and start Paxil which she has taken in the past and did well with. Now comes generic Cipro should be much more affordable. Followup in about 6 weeks to make sure that she's doing well and adjust her dose as needed.

## 2012-07-09 ENCOUNTER — Telehealth: Payer: Self-pay | Admitting: *Deleted

## 2012-07-09 MED ORDER — SERTRALINE HCL 25 MG PO TABS: ORAL_TABLET | ORAL | Status: DC

## 2012-07-09 NOTE — Telephone Encounter (Signed)
No problem. Prescription sent.  °

## 2012-07-09 NOTE — Telephone Encounter (Signed)
Patient calls and wants to know if you will send a refill on her Zoloft that she was on. States the Paxil was not working for her so she went back to the Zoloft. [facility]

## 2012-07-21 ENCOUNTER — Encounter: Payer: Self-pay | Admitting: Family Medicine

## 2012-07-21 ENCOUNTER — Telehealth: Payer: Self-pay | Admitting: *Deleted

## 2012-07-21 MED ORDER — SERTRALINE HCL 100 MG PO TABS: 150.0000 mg | ORAL_TABLET | Freq: Every day | ORAL | Status: DC

## 2012-07-21 NOTE — Telephone Encounter (Signed)
Ok will remove dx. we may have used at some point time she felt like she was having difficulty with her memory to cover lab results et Karie Soda. I warm it up from her problem list. Unfortunately even temporary problem stick on there and he take them off. Refill sent on the Zoloft.

## 2012-07-21 NOTE — Telephone Encounter (Signed)
Pt calls and states that she is on the Zoloft 150mg  daily. Do not see this on med list just see the 25mg  dose but said you all had discussed this. [facility]. Patient also said on my chart she noticed a diagnosis of memory loss and states she doesn't think she has ever had that and wanted to know if could be taken off.

## 2012-07-24 ENCOUNTER — Ambulatory Visit (INDEPENDENT_AMBULATORY_CARE_PROVIDER_SITE_OTHER): Payer: Managed Care, Other (non HMO) | Admitting: Family Medicine

## 2012-07-24 ENCOUNTER — Encounter: Payer: Self-pay | Admitting: Family Medicine

## 2012-07-24 VITALS — BP 134/80 | HR 95 | Ht 64.0 in | Wt 216.0 lb

## 2012-07-24 DIAGNOSIS — J3489 Other specified disorders of nose and nasal sinuses: Secondary | ICD-10-CM

## 2012-07-24 DIAGNOSIS — J019 Acute sinusitis, unspecified: Secondary | ICD-10-CM

## 2012-07-24 DIAGNOSIS — F341 Dysthymic disorder: Secondary | ICD-10-CM

## 2012-07-24 DIAGNOSIS — F418 Other specified anxiety disorders: Secondary | ICD-10-CM | POA: Insufficient documentation

## 2012-07-24 MED ORDER — FLUTICASONE PROPIONATE 50 MCG/ACT NA SUSP: 2.0000 | Freq: Every day | NASAL | Status: DC

## 2012-07-24 MED ORDER — AZITHROMYCIN 250 MG PO TABS: ORAL_TABLET | ORAL | Status: DC

## 2012-07-24 MED ORDER — VENLAFAXINE HCL ER 75 MG PO CP24: 75.0000 mg | ORAL_CAPSULE | Freq: Every day | ORAL | Status: DC

## 2012-07-24 NOTE — Progress Notes (Signed)
Subjective:    Patient ID: Virginia Logan, female    DOB: 1970/09/16, 42 y.o.   MRN: [id number]  HPI Followup on mood. History of depression with anxiety. She tried Paxil but felt it was not helpful. She decided to go back on the sertraline. She is now up to one half tabs, 150 mg. She's only been on that dose for couple of days. She was on Zoloft for years. She has done well until the last year. She's been more stressed. She still more down in distracted. She's had lack of focus. She is interested in trying something different again. She was on Pristiq at one time and felt like he was helpful but it was extremely cost prohibitive. Was about $170 per month. Sleep is fair.  She still having some sinus symptoms. The last month saw her she was treated for a sinus infection. She said she did get completely get better, and then a couple weeks later started getting a lot of nasal congestion again. It is now moving into her chest. She's had it for at least 2 weeks at this point. No fever. Congestion is yellow to green color. Mild cough. No sore throat. No ear pain. She's not taking anything over-the-counter. She is using her nasal spray.  She's also had persistent sore in her right nostril. She's been applying Bactroban ointment and it does seem to be getting better slowly. But still tender. No swelling. No blood. No fever.  Review of Systems     Objective:   Physical Exam  Constitutional: She is oriented to person, place, and time. She appears well-developed and well-nourished.  HENT:  Head: Normocephalic and atraumatic.  Right Ear: External ear normal.  Left Ear: External ear normal.  Nose: Nose normal.  Mouth/Throat: Oropharynx is clear and moist.  TMs and canals are clear.   Eyes: Conjunctivae and EOM are normal. Pupils are equal, round, and reactive to light.  Neck: Neck supple. No thyromegaly present.  Cardiovascular: Normal rate, regular rhythm and normal heart sounds.   Pulmonary/Chest:  Effort normal and breath sounds normal. She has no wheezes.  Lymphadenopathy:    She has no cervical adenopathy.  Neurological: She is alert and oriented to person, place, and time.  Skin: Skin is warm and dry.  Psychiatric: She has a normal mood and affect. Her behavior is normal.    I was unable to see an ulceration in the nose.      Assessment & Plan:  Depression with anxiety-discussed different options. We can certainly try Effexor which is a precursor of Pristiq. I think she will do well with this. I did warn that this medication is very difficult to wean whenever we decided to stop it. She would like to try. A written taper off of her Zoloft and instructed when to actually start the Effexor. I would like to see her back in for 6 weeks to make sure that she's tolerating it well and to adjust her dose at that point in time. Reminded her again that it does take time for these medications to become effective. Certainly call me if she feels that she's having any side effects and she is concerned about.  Acute sinusitis-will treat with azithromycin. Call if not significantly better in one week. I'm not sure if this is a continuation of the original sinus infection or completely different infection. Make sure using the fluticasone and recommend adding over-the-counter Allegra or Claritin especially as we get ready to move into the spring season  which is a difficult time for her with her allergies.  Nasal sore-continue with the Bactroban ointment since it seems to be helping. Avoid excessive blowing and make sure using a humidifier to keep the passages well moisturized.

## 2012-07-24 NOTE — Patient Instructions (Signed)
Decrease sertraline to one tab daily for one week and then 1/2 tab daily for one week, then stop. Once own to half tab on the sertraline can start the Effexor.

## 2012-07-29 ENCOUNTER — Encounter: Payer: Managed Care, Other (non HMO) | Admitting: Family Medicine

## 2012-08-04 ENCOUNTER — Encounter: Payer: Self-pay | Admitting: Family Medicine

## 2012-09-04 ENCOUNTER — Encounter: Payer: Self-pay | Admitting: Family Medicine

## 2012-09-04 ENCOUNTER — Other Ambulatory Visit: Payer: Self-pay | Admitting: *Deleted

## 2012-09-04 MED ORDER — SERTRALINE HCL 100 MG PO TABS: ORAL_TABLET | ORAL | Status: DC

## 2012-09-05 ENCOUNTER — Telehealth: Payer: Self-pay | Admitting: *Deleted

## 2012-09-05 MED ORDER — ALBUTEROL SULFATE HFA 108 (90 BASE) MCG/ACT IN AERS: 2.0000 | INHALATION_SPRAY | Freq: Four times a day (QID) | RESPIRATORY_TRACT | Status: DC | PRN

## 2012-09-05 NOTE — Telephone Encounter (Signed)
Pt calls and would like a refill on her albuterol. States she has used this in the past and her rx has expired and needs refill becasuse she fills her asthma flaring. [facility].

## 2012-09-05 NOTE — Telephone Encounter (Signed)
Need follow up on asthma symptoms if using albuterol regularly and no relief.

## 2012-09-05 NOTE — Telephone Encounter (Signed)
Ok to send

## 2012-09-08 ENCOUNTER — Telehealth: Payer: Self-pay | Admitting: Physician Assistant

## 2012-09-08 MED ORDER — SERTRALINE HCL 100 MG PO TABS: ORAL_TABLET | ORAL | Status: DC

## 2012-09-08 NOTE — Telephone Encounter (Signed)
Will send zoloft.

## 2012-09-11 ENCOUNTER — Ambulatory Visit: Payer: Managed Care, Other (non HMO) | Admitting: Family Medicine

## 2012-09-22 ENCOUNTER — Encounter: Payer: Self-pay | Admitting: Family Medicine

## 2012-10-10 ENCOUNTER — Ambulatory Visit (INDEPENDENT_AMBULATORY_CARE_PROVIDER_SITE_OTHER): Payer: Managed Care, Other (non HMO) | Admitting: Family Medicine

## 2012-10-10 ENCOUNTER — Encounter: Payer: Self-pay | Admitting: Family Medicine

## 2012-10-10 VITALS — BP 131/74 | HR 101 | Wt 216.0 lb

## 2012-10-10 DIAGNOSIS — M545 Low back pain: Secondary | ICD-10-CM

## 2012-10-10 MED ORDER — CYCLOBENZAPRINE HCL 10 MG PO TABS: 10.0000 mg | ORAL_TABLET | Freq: Every evening | ORAL | Status: DC | PRN

## 2012-10-10 MED ORDER — KETOROLAC TROMETHAMINE 60 MG/2ML IM SOLN
60.0000 mg | Freq: Once | INTRAMUSCULAR | Status: AC
  Administered 2012-10-10: 60 mg via INTRAMUSCULAR

## 2012-10-10 NOTE — Progress Notes (Signed)
  Subjective:    Patient ID: Virginia Logan, female    DOB: 01-Nov-1970, 42 y.o.   MRN: [id number]  HPI RIght low back pain started about 1.5 weeks ago after working in the yard. Then got a little better.  Then got worse the week.  No ice heat.  Using IBUprofen and helping some.  No radiatoin into legs. Wose with istting and standing, changing positions and twisting.     Review of Systems     Objective:   Physical Exam  Constitutional: She is oriented to person, place, and time. She appears well-developed and well-nourished.  HENT:  Head: Normocephalic and atraumatic.  Musculoskeletal:  Decreased lumbar flexion ,extension. Pain with rotation to the right and the left. She had more pain turning to the left. She's very tender over the right paraspinous muscles near the lumbar spine. Nontender over the SI joint. She's also little bit tender towards the buttocks. Negative straight leg raise bilaterally. Hip, knee, ankle strength is 5 out of 5 patellar reflexes 2+ bilaterally.  Neurological: She is alert and oriented to person, place, and time.  Skin: Skin is warm and dry.  Psychiatric: She has a normal mood and affect. Her behavior is normal.          Assessment & Plan:  Right low back Pain - Toradol injection for pain relief.  Continue IBUprofen 800mg  TID with food and water. Will add muscle relaxer. H.O given on stretches. If she's not was 50% better in one week with the stretches, Celexa anti-inflammatory then I recommend she call me back and we can investigate this further. She had no specific injury so I did not get an x-ray today. I think this is mostly just a lumbar strain from over work in the yard. Offered to give her a small quantity of hydrocodone but she declined at this time.

## 2012-10-10 NOTE — Addendum Note (Signed)
Addended by: Deno Etienne on: 10/10/2012 01:32 PM   Modules accepted: Orders

## 2012-10-10 NOTE — Patient Instructions (Addendum)
Call if not better in one week.  

## 2012-11-05 ENCOUNTER — Encounter: Payer: Self-pay | Admitting: Family Medicine

## 2012-11-10 ENCOUNTER — Other Ambulatory Visit: Payer: Self-pay | Admitting: *Deleted

## 2012-11-10 MED ORDER — SERTRALINE HCL 100 MG PO TABS: ORAL_TABLET | ORAL | Status: DC

## 2013-01-29 ENCOUNTER — Other Ambulatory Visit: Payer: Self-pay | Admitting: Family Medicine

## 2013-03-10 ENCOUNTER — Telehealth: Payer: Self-pay | Admitting: *Deleted

## 2013-03-10 ENCOUNTER — Other Ambulatory Visit: Payer: Self-pay | Admitting: Family Medicine

## 2013-03-10 NOTE — Telephone Encounter (Signed)
Called pt and informed her that she will need to come in and sign the paperwork before we can fax it to the insurance co. And that it would have to be faxed from our office She stated that she will come by tomorrow.Virginia Logan, Virginia Logan

## 2013-03-31 ENCOUNTER — Other Ambulatory Visit: Payer: Self-pay | Admitting: Family Medicine

## 2013-04-02 ENCOUNTER — Other Ambulatory Visit: Payer: Self-pay

## 2013-04-20 ENCOUNTER — Telehealth: Payer: Self-pay | Admitting: Physician Assistant

## 2013-04-20 NOTE — Telephone Encounter (Signed)
Call pt: insurance will not pay for proventil any more must rx proair HFA for next prescription. Please send to pharmacy

## 2013-04-29 ENCOUNTER — Other Ambulatory Visit: Payer: Self-pay | Admitting: *Deleted

## 2013-04-29 MED ORDER — ALBUTEROL SULFATE HFA 108 (90 BASE) MCG/ACT IN AERS: 2.0000 | INHALATION_SPRAY | Freq: Four times a day (QID) | RESPIRATORY_TRACT | Status: DC | PRN

## 2013-04-29 NOTE — Telephone Encounter (Signed)
proair sent to pharm

## 2013-05-25 ENCOUNTER — Other Ambulatory Visit: Payer: Self-pay | Admitting: Family Medicine

## 2013-06-08 ENCOUNTER — Encounter: Payer: Self-pay | Admitting: Family Medicine

## 2013-06-08 ENCOUNTER — Ambulatory Visit (INDEPENDENT_AMBULATORY_CARE_PROVIDER_SITE_OTHER): Payer: Managed Care, Other (non HMO) | Admitting: Family Medicine

## 2013-06-08 ENCOUNTER — Encounter: Payer: Self-pay | Admitting: *Deleted

## 2013-06-08 VITALS — BP 121/79 | HR 80 | Temp 99.3°F | Ht 64.0 in | Wt 223.0 lb

## 2013-06-08 DIAGNOSIS — F32A Depression, unspecified: Secondary | ICD-10-CM

## 2013-06-08 DIAGNOSIS — F329 Major depressive disorder, single episode, unspecified: Secondary | ICD-10-CM

## 2013-06-08 DIAGNOSIS — F419 Anxiety disorder, unspecified: Principal | ICD-10-CM

## 2013-06-08 DIAGNOSIS — F341 Dysthymic disorder: Secondary | ICD-10-CM

## 2013-06-08 MED ORDER — CLONAZEPAM 0.5 MG PO TBDP: 0.5000 mg | ORAL_TABLET | Freq: Two times a day (BID) | ORAL | Status: DC | PRN

## 2013-06-08 NOTE — Patient Instructions (Addendum)
One sertraline to 1 tab daily for 4 days then decrease to 1/2 tab daily for 7 days and then stop.   Start the Viibryd in 5 days and take as directed in the starter pack.

## 2013-06-08 NOTE — Progress Notes (Signed)
   Subjective:    Patient ID: Virginia Logan, female    DOB: [birth year], 43 y.o.   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  She has been very emotional and tearful the last few months.  Has had crying spells and has had panic attacks where she will suddenly get chest pressure, ears ringing and feel panicked. Can sometimes take 30 min to resolve.  She is on sertraline 150mg  and has been for the last year. She wonders if may be part of her mood or menopause.  She denies any increase in depression sxs overall.  New job has been stressful as well. Says started crying on the phone with customer the other day.  She is not sleeping well at all. Says Tylenol PM used to work but not really helping now    Review of Systems     Objective:   Physical Exam  Constitutional: She is oriented to person, place, and time. She appears well-developed and well-nourished.  HENT:  Head: Normocephalic and atraumatic.  Eyes: Conjunctivae are normal. Pupils are equal, round, and reactive to light.  Cardiovascular: Normal rate, regular rhythm and normal heart sounds.   Pulmonary/Chest: Effort normal and breath sounds normal.  Neurological: She is alert and oriented to person, place, and time.  Skin: Skin is warm and dry.  Psychiatric: She has a normal mood and affect. Her behavior is normal.  Tearful in the office today          Assessment & Plan:  Depression/Anxiety - discussed options. consisder change sertraline to Viibryd.  Given written wean for sertraline and starter pack for Viibryd.  OUt of work for 5 days while we are adjusting her medicaton. I worry she will have problemss dealing with clients in her current state. Given small quantity of clonazepam to use prn. Warned about addictive potential and to use if having a panic attack.  Warned about sedation as well. Can use the clonazepam prn for sleep as well until we get meds changed. F/U in 3-4  weeks to make sure doing well and see if tolerating medication.  Can refill for Viibryd 40mg  if tolerating well.   Time spent 25 min, > 50% spent counseling about depression/anxiety

## 2013-06-10 ENCOUNTER — Encounter: Payer: Self-pay | Admitting: Family Medicine

## 2013-06-11 ENCOUNTER — Other Ambulatory Visit: Payer: Self-pay | Admitting: Family Medicine

## 2013-06-11 ENCOUNTER — Telehealth: Payer: Self-pay | Admitting: *Deleted

## 2013-06-11 NOTE — Telephone Encounter (Signed)
pts information faxed to [employer] [phone] and to Duffy Rhody [phone]. Copy sent to scan.Audelia Hives [place]

## 2013-06-11 NOTE — Telephone Encounter (Signed)
Pt wanted dr. Madilyn Fireman to know that the clonopin doesn't seem to be working for her. Pt was advised to seek counseling through her employers EAP program and that dr. Madilyn Fireman is going to work on getting a referral in for her to be seen by psychology. Pt voiced understanding and agreed.Virginia Logan [place]

## 2013-06-18 ENCOUNTER — Encounter: Payer: Self-pay | Admitting: Family Medicine

## 2013-06-19 MED ORDER — VILAZODONE HCL 40 MG PO TABS: 40.0000 mg | ORAL_TABLET | Freq: Every day | ORAL | Status: DC

## 2013-06-26 ENCOUNTER — Encounter: Payer: Self-pay | Admitting: Family Medicine

## 2013-06-26 ENCOUNTER — Telehealth: Payer: Self-pay | Admitting: *Deleted

## 2013-06-26 NOTE — Telephone Encounter (Signed)
Called pt to ask about how she was doing. She stated that she was doing great but know she feels that she is back to square one. She stated that she was denied her request to get off to be seen and that she has not given her request for her upcoming appt. Pt reports that she has been having more episodes of crying thus she finds that she is taking more of her klonipin. I asked her if she has checked EAP through her employer and she stated that it's set up differently. She states that her supervisor is not really working w/her to get the time off that she needs for appts. I told her that she will need to take that up with HR since they are the ones who approved her short term disability. I told her that we would do as much as we possibly could on our end to help her in any way possible to get through this. Maryruth Eve, [place] Cro[place]

## 2013-06-30 ENCOUNTER — Ambulatory Visit (INDEPENDENT_AMBULATORY_CARE_PROVIDER_SITE_OTHER): Payer: Managed Care, Other (non HMO) | Admitting: Family Medicine

## 2013-06-30 ENCOUNTER — Encounter: Payer: Self-pay | Admitting: Family Medicine

## 2013-06-30 VITALS — BP 126/75 | HR 102 | Ht 64.0 in | Wt 223.0 lb

## 2013-06-30 DIAGNOSIS — F3289 Other specified depressive episodes: Secondary | ICD-10-CM

## 2013-06-30 DIAGNOSIS — F32A Depression, unspecified: Secondary | ICD-10-CM

## 2013-06-30 DIAGNOSIS — G4700 Insomnia, unspecified: Secondary | ICD-10-CM

## 2013-06-30 DIAGNOSIS — F329 Major depressive disorder, single episode, unspecified: Secondary | ICD-10-CM

## 2013-06-30 MED ORDER — TRAZODONE HCL 50 MG PO TABS: 25.0000 mg | ORAL_TABLET | Freq: Every evening | ORAL | Status: DC | PRN

## 2013-06-30 NOTE — Progress Notes (Addendum)
Subjective:    Patient ID: Virginia Logan, female    DOB: [birth year], 43 y.o.   MRN: [id number]  HPI Depression with anxiety-PHQ 9 score of 18 today and GAD 7 score of 17. No thoughts of harming herself or feeling better off dead. He does still feel down and depressed more than half of the days and complains of low energy and feeling bad about herself. She feels constantly nervous and on edge Every day and has trouble relaxing and complains of restlessness. No significant complaint of irritability.  Has had to use her clonazepam over Thursday, Friday, and Saturday. Feels like her mood is worse around the tim eo fher cycle.  + family hx of depression.  Mother is on sertraline. They have made some accomodations at work and has moved her into a more open space. There she says they keep trying to pull her into meetings in very small closed spaces the increase her anxiety. She also feels like her current manager is not very supportive. In fact she had initially as offered him to come to doctor's appointments scheduled for the night to followup with me on medication and her manager denied it. Which means she can calm but if she does then she will have an occurrence against her.  She is feeling overwhelmed and says last Thursday and Friday very difficult at work. She could not keep her emotions in check. She felt extremely anxious. She had to use her clonazepam both of those days. She did get some relief the medication but it was very temporary. Feels like a walk very quickly. Also felt like it took a while to kick in..   Review of Systems  BP 126/75  Pulse 102  Ht 5\' 4"  (1.626 m)  Wt 223 lb (101.152 kg)  BMI 38.26 kg/m2  SpO2 98%    Allergies  Allergen Reactions  . Bactrim [Sulfamethoxazole-Trimethoprim] Anaphylaxis  . Moxifloxacin     REACTION: Rash  . Paxil [Paroxetine Hcl] Other (See Comments)    Excess sedation     Past Medical History  Diagnosis Date  . Anemia     Past Surgical  History  Procedure Laterality Date  . Left ankle      History   Social History  . Marital Status: Married    Spouse Name: N/A    Number of Children: N/A  . Years of Education: N/A   Occupational History  . Not on file.   Social History Main Topics  . Smoking status: Former Smoker -- 0.30 packs/day    Types: Cigarettes  . Smokeless tobacco: Not on file  . Alcohol Use: No  . Drug Use: No  . Sexual Activity: Not on file   Other Topics Concern  . Not on file   Social History Narrative  . No narrative on file    Family History  Problem Relation Age of Onset  . Diabetes Mother   . Hyperlipidemia Mother   . Heart attack Mother   . Cancer Father     prostate  . Graves' disease Father   . Crohn's disease Brother     Outpatient Encounter Prescriptions as of 06/30/2013  Medication Sig  . albuterol (PROAIR HFA) 108 (90 BASE) MCG/ACT inhaler Inhale 2 puffs into the lungs every 6 (six) hours as needed for wheezing or shortness of breath.  . clonazePAM (KLONOPIN) 0.5 MG disintegrating tablet Take 1 tablet (0.5 mg total) by mouth 2 (two) times daily as needed for seizure.  . cyclobenzaprine (  FLEXERIL) 10 MG tablet Take 1 tablet (10 mg total) by mouth at bedtime as needed for muscle spasms.  . Ibuprofen-Diphenhydramine HCl (ADVIL PM) 200-25 MG CAPS Take by mouth.    . sertraline (ZOLOFT) 100 MG tablet TAKE 1 AND 1/2 TABLET BY MOUTH DAILY  . traZODone (DESYREL) 50 MG tablet Take 0.5-1 tablets (25-50 mg total) by mouth at bedtime as needed for sleep.  . Vilazodone HCl (VIIBRYD) 40 MG TABS Take 1 tablet (40 mg total) by mouth daily.          Objective:   Physical Exam  Constitutional: She appears well-developed and well-nourished.  HENT:  Head: Normocephalic and atraumatic.  Skin: Skin is warm and dry.  Psychiatric: She has a normal mood and affect. Her behavior is normal. Judgment and thought content normal.  + tearful          Assessment & Plan:   Depression/Anxiety/acute situational stress -  Discussed options. She is agreeablt to counseling. They don't actually offer counseling through [facility] at work.  Will refer to our therapist here in our building and behavioral health. I think this is extremely important for the healing process. She did have a very positive response the Viibryd initially but after going up on the 40 mg dose about a week ago she feels like her anxiety  has increased and her mood has decreased. For now would like for her to continue the 40 mg dose for at least one more week. I want to take her out of work is a delay she is not functional at this point in time. If after one week she still feels increased anxiety and decreased mood and will decrease her back down to 20 mg the Viibryd. I would like to see her back in 2 weeks as it does take his medication time to work. in the meantime we'll work on trying to get her in with a therapist. She still has about 11 clonazepam left. Hopefully this will last until I see her back, if not then then she can call for refill. She does feel it is helpful when needed but definitely feels that it is temporary.   Time spent 25 min, >50% spent counseling about depression/Anxiety.

## 2013-07-01 ENCOUNTER — Ambulatory Visit (INDEPENDENT_AMBULATORY_CARE_PROVIDER_SITE_OTHER): Payer: Managed Care, Other (non HMO) | Admitting: Psychiatry

## 2013-07-01 DIAGNOSIS — F411 Generalized anxiety disorder: Secondary | ICD-10-CM

## 2013-07-01 DIAGNOSIS — F321 Major depressive disorder, single episode, moderate: Secondary | ICD-10-CM

## 2013-07-01 DIAGNOSIS — F418 Other specified anxiety disorders: Secondary | ICD-10-CM

## 2013-07-01 NOTE — Progress Notes (Signed)
  Virginia Logan 43 y.o. 07/01/2013   Referred by: Dr. Madilyn Fireman   Patient Presents With:    Anxiety    Depression  HISTORY OF CHIEF COMPLAINT  Pt is a 43 year old, married, employed, Caucasian, female, referred by her Primary Care Doctor, Dr. Madilyn Fireman, for the treatment of anxiety and depression associated with job stress. 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 Pt sought short term disability and has been off from work for the past three days. Pt was tearful as she described "not being able to go into work right now" due the negative work environment. Pt expressed symptoms of sadness, tearfulness, guilt and shame and increased weight gain associated with not being successful at her job.  Pt expressed a need to be employed to financially support her family and concerns that she may not find alternate employment. Pt said her main concern is getting better and learning ways to reduce the depression and anxiety symptoms.    MENTAL HEALTH  Diagnosis: Depression, Single Episode, Moderate and Generalized Anxiety Disorder Suicide attempts: Denies Hospitalizations: Denies Therapist: None Psychiatrist: None Self-harm: Denies Violent Behaviors: Denies  Patient reports a history of depression and anxiety that were managed successfully through lifestyle adjustments and Pt had Postpartum depression fourteen years ago following the birth of her son where she started taking Zoloft.   Medications: Vibrid and Clonazepam PRN - Prescribed by PCP, Dr. Madilyn Fireman   MEDICAL HISTORY Pt is questioning if she could have Perimenopause, which could be contributing to feelings of anxiety and depression.     SUBSTANCE  ABUSE HISTORY (Past 12 months) Pt denies  SOCIAL HISOTRY  Current Place of Residence: [place] Living Arrangements: Lives with husband and their 74 year old son Marital Status: Married Children: Son, age 55 Education:  Educational Problems/Performance: Denies Religious Beliefs/Practices:  History of Abuse: Denies Nature conservation officer History: Denies Legal History: Denies Hobbies/Interests:    FAMILY HISTORY Assessed, but Pt denies any relevant history.    EMPLOYMENT/FINANICAL Pt is currently on short term disability with her job at [employer] as a Retail banker. This is Pts primary stressor and reason for entering counseling treatment.  Pt and her husband are both employed and contribute to the financial needs of the family.    TRAUMA/GRIEF AND LOSS Assessed, but Pt denies any relevant history.    MENTAL STATUS EXAMINATION/EVALUATION  Appearance: Neat and Well Groomed   Eye Contact: Good  Speech: Clear and Coherent and Normal Rate   Volume: Normal  Mood: Depressed  Affect: Tearful   Thought Process: Coherent, Linear and Logical   Orientation: Full   Thought Content: WDL   Suicidal Thoughts: No   Homicidal Thoughts: No   Judgment: Good  Insight: Good  Psychomotor Activity: Normal   Handed:   Assets: Leadership qualities, employed, strong family support.       ASSESSMENT AXIS I  Depression, Single Episode, Moderate and Generalized Anxiety Disorder    PLAN Oriented Pt to the therapeutic process and discussed therapeutic expectations. Pt agrees to weekly counseling to address symptoms of depression and anxiety associated with job stress and to learn ways to cope with stress effectively.    Tresa Res, LCSW 07/01/2013

## 2013-07-02 ENCOUNTER — Encounter: Payer: Self-pay | Admitting: Family Medicine

## 2013-07-03 ENCOUNTER — Ambulatory Visit (INDEPENDENT_AMBULATORY_CARE_PROVIDER_SITE_OTHER): Payer: Managed Care, Other (non HMO) | Admitting: Physician Assistant

## 2013-07-03 ENCOUNTER — Encounter: Payer: Self-pay | Admitting: *Deleted

## 2013-07-03 VITALS — BP 124/73 | HR 92 | Temp 98.4°F | Wt 227.0 lb

## 2013-07-03 DIAGNOSIS — R300 Dysuria: Secondary | ICD-10-CM

## 2013-07-03 DIAGNOSIS — R319 Hematuria, unspecified: Secondary | ICD-10-CM

## 2013-07-03 LAB — POCT URINALYSIS DIPSTICK
Bilirubin, UA: NEGATIVE
Glucose, UA: NEGATIVE
Ketones, UA: NEGATIVE
Leukocytes, UA: NEGATIVE
NITRITE UA: NEGATIVE
PH UA: 6
PROTEIN UA: NEGATIVE
Spec Grav, UA: 1.015
Urobilinogen, UA: 0.2

## 2013-07-03 NOTE — Progress Notes (Signed)
Pt reports that she experienced a stomach virus last week. No c/o fever,backache,abdominal pain. UA collected trace-intact blood in sample sent for Ucx.Elouise Munroe   The treatment was given at this time. Will wait for urine culture. Iran Planas PA-C

## 2013-07-05 LAB — URINE CULTURE
Colony Count: NO GROWTH
Organism ID, Bacteria: NO GROWTH

## 2013-07-06 ENCOUNTER — Ambulatory Visit: Payer: Managed Care, Other (non HMO) | Admitting: Family Medicine

## 2013-07-06 ENCOUNTER — Encounter: Payer: Self-pay | Admitting: Family Medicine

## 2013-07-07 ENCOUNTER — Telehealth (HOSPITAL_COMMUNITY): Payer: Self-pay | Admitting: Psychiatry

## 2013-07-07 NOTE — Telephone Encounter (Signed)
Writer left a message for Pt informing her that paperwork was received from [place] per Pts request for short term disability approval. Writer informed Pt that since there has not been a therapeutic history with Probation officer since she is a new Pt, it is recommended that Pt discuss with with her current psychiatrist, Dr. Josephine Igo, to get his recommendation. Writer indicated that she has not known Pt or her symptoms long enough to make a through recommendation.

## 2013-07-08 ENCOUNTER — Ambulatory Visit (INDEPENDENT_AMBULATORY_CARE_PROVIDER_SITE_OTHER): Payer: Managed Care, Other (non HMO) | Admitting: Psychiatry

## 2013-07-08 DIAGNOSIS — F321 Major depressive disorder, single episode, moderate: Secondary | ICD-10-CM

## 2013-07-08 DIAGNOSIS — F411 Generalized anxiety disorder: Secondary | ICD-10-CM | POA: Insufficient documentation

## 2013-07-08 NOTE — Progress Notes (Signed)
THERAPIST PROGRESS NOTE  Session Time: 9:00-9:50 am  Participation Level: Active  Behavioral Response: Casual, Neat and Well GroomedAlertAnxious and Depressed  Type of Therapy: Individual Therapy  Treatment Goals addressed: Depression, Anxiety, Job Stress  Interventions: CBT, Solution Focused and Supportive  Summary: Virginia Logan is a 43 y.o. female who presents with anxious and depressed mood and depressed affect. Pt said she received writers phone message from yesterday regarding her Short Term Disability papers that were sent over from [place]. Pt said she had Dr. Gardiner Ramus office complete them and said it was a misunderstanding as to why Probation officer received them. Pt requested writer shred the forms since no follow up was needed on Conservation officer, nature. Pt reports high depression and anxiety symptoms associated with fears of returning back to her job. Pt said her doctor has given her through March 1st off from work and Pt said this is a "relief". Pt explored reasons why she finds it hard to go back to work and identified that she feels "micromanged" by her supervisor and does not like all of the rules and structure this job places on her. Pt said she responds better in jobs that allow her more autonomy. Pt said she has a history of struggling in work settings with the "politics" of the jobs and states that she has had complications at at least four different past jobs where they put a lot of restrictions on Pt that caused job stress. 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 Pt stated she is aware that she will not be able to return to her current job as she feels this is her primary contributor to her depression and anxiety. Pt said she would like to focus on learning ways to seek alternate employment during her leave from work. Pt identified treatment  plan goals (see below).      INDIVIDUAL TREATMENT PLAN       Date of Admission:  07-01-13 Date of Treatment Plan: 07-08-13     Service: [x]  Group  [x]  Individual [x]  Comprehensive []  Revised due to: []  Change in Diagnosis     []  Change in Service     []  Expiration of Previous Treatment Plan  Diagnosis:  Depression Single Episode Moderate and Generalized Anxiety Disorder  Recommended Treatment:  [x]  Individual Therapy  []  Family Therapy  [x]  Couples Therapy  [x]  Chemical Dependency (CD-IOP) group therapy 3x weekly and 1:1 individual therapy as required  []  Mental Health (MH-IOP) group therapy 5x weekly and 1:1 individual therapy as required   Possible Negative Outcomes of Treatment: Symptoms of mental illness may increase and changes in relationship can occur during the course of treatment.  Client's Strengths (What are the strengths and resources that will help clients move towards their goals):  Supportive husband, employed, financially stable   Personal Recovery Goal(s)/Client's Goals for Treatment (use client's words):  To be happy with her job and not have depression and anxiety symptoms from job stress.     Objective Behavioral Criteria for Discharge: Client will maintain stability in the community as demonstrated by the following:   No suicidal behaviors for 3 months.   No hospitalizations or emergency room visits for psychological reasons for 3 months.   No SIB behaviors requiring medical attention for 3 months.   Emotional regulation by reporting distress at an average of 5/10 or  below for 3 months.   Demonstration of healthy community supports by initiating peer contact at least twice weekly separate from the treatment environment.   Agreed-upon transition plan to a less restrictive setting.  INFORMED CONSENT TO TREATMENT. I acknowledge that I have been informed of and am able to understand my diagnosis, the nature and purpose of the proposed treatment, the risks and  benefits of the proposed treatment, the possible negative outcomes of and possible alternatives to the proposed treatment, the probability that the proposed treatment will be successful, and the prognosis if I choose not to receive the treatment. Further, I have been informed of the extent and limits of confidentiality of treatment information. I understand the risks, benefits, possible negative outcomes of and alternatives to this proposed treatment, and my signature below verifies that I have actively participated in the development of my treatment plan and that I am willingly and voluntarily agreeing to the treatment outlined in this plan.   Assessed Needs (Problem) Client's Goals (what client will do)   Interventions (what staff will do)   1. Depression   o Have improved mood and return to a healthier level of functioning.   o Identify the causes for depressed mood and learn ways to cope with depression.   o Explore how depression is experienced in day-to-day living; encourage sharing of feelings of depression to gain insight into causes and create a coping plan to manage depression symptoms. o Provide education about depression to help patient identify causes, symptoms and triggers. o Teach and encourage the use of coping skills for management of depression symptoms.     2. Anxiety    o Improve ability to manage anxiety symptoms and better handle stress.  o Identify causes for anxiety and explore ways to lower it  o Resolve the core conflict that is the cause of the anxiety.  o Manage thoughts and worrisome thinking that contribute to feelings of anxiety.   o Provide education about anxiety to help patient identify causes, symptoms and triggers. o Facilitate problem solution skills to help patient identify and implement options to resolve stressors.  o Teach coping skills to manage anxiety symptoms such as; biofeedback, guided imagery, meditation and aromatherapy. o Use CBT to  identify and change anxiety provoking thought patterns.   3. Job Stress  o Explore options for new employment that will be a better fit for Pt.  o Learn about what types of jobs would be a good fit for both personality and financial needs.   o Process feelings of sadness and helplessness over dissatisfaction with employment.  o Use problem solution skills to help Pt identify employment options. o Explore Pts thoughts and behaviors that have had a negative impact on past and current jobs. o Teach stress management skills to help Pt function more effectively in employment settings.    I have [] received [] declined a copy of this treatment plan.  Client: Date: Guardian/Parent: Date:   Therapist: Date: Licensed Provider (if applicable): Date:    Six month review:   I have reviewed this treatment plan and consider it still valid. Client: Date: Guardian/Parent: Date:      Suicidal/Homicidal: No  Therapist Response: Assessed overall level of depression and anxiety per Pt self report and created treatment plan goals.   Plan: Return again in 1 week. Begin working on treatment plan goals.   Diagnosis: Axis I: Depression single episode moderate and generalized anxiety disorder      Tresa Res, LCSW 07/08/2013

## 2013-07-15 ENCOUNTER — Ambulatory Visit (INDEPENDENT_AMBULATORY_CARE_PROVIDER_SITE_OTHER): Payer: Managed Care, Other (non HMO) | Admitting: Psychiatry

## 2013-07-15 DIAGNOSIS — F411 Generalized anxiety disorder: Secondary | ICD-10-CM

## 2013-07-15 DIAGNOSIS — F321 Major depressive disorder, single episode, moderate: Secondary | ICD-10-CM

## 2013-07-15 NOTE — Progress Notes (Signed)
   THERAPIST PROGRESS NOTE  Session Time: 2:00-2:50 pm  Participation Level: Active  Behavioral Response: Neat, Well Groomed and Dressed up for a job interviewAlertEuthymic  Type of Therapy: Individual Therapy  Treatment Goals addressed: Depression, Anxiety and Job Stress  Interventions: CBT  Summary: Virginia Logan is a 43 y.o. female who presents with euthymic mood and affect. Pt said she is dressed up because she had an interview this morning. Pt was smiling and said she feels better than she has a long time. Pt said she had a "very insightful" week following last weeks therapy session. Pt said she started thinking about how she knows she can't go back to her job at [employer] and be well. 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 Pt said she should hear something back by next week. Pt also said she has another job interview with a rehabilitation center with [employer] in [place] at the end of the month. Pt appeared hopeful and optimistic. Pt is exploring how past and current behaviors have led her to dissatisfaction in different jobs and how to break the cycle by finding careers she is a better fit for. Pt is learning she is drawn more to helping professions and said she can not rule out going back to school after she gets her financial debts more stabilized, for a career in a counseling profession. Pt reports depression and anxiety very low and said she feels "at peace".    Suicidal/Homicidal: No  Therapist Response: Assessed overall level of depression and anxiety symptoms per Pt self report. Discussed changes Pt made in her thought process and in her job searching that led her to improved mood due to her new possible job opportunities. Used CBT to reframe unhealthy thought patterns and reinforce healthy ones.   Plan: Return again in 1 weeks. Begin working on  treatment plan goals. Pt wants to learn how to use guided imagery for stress management at next session.   Diagnosis: Axis I: Depression Major, single episode moderate and Generalized Anxiety Disorder       Tresa Res, LCSW 07/15/2013

## 2013-07-20 ENCOUNTER — Ambulatory Visit (INDEPENDENT_AMBULATORY_CARE_PROVIDER_SITE_OTHER): Payer: Managed Care, Other (non HMO) | Admitting: Family Medicine

## 2013-07-20 ENCOUNTER — Encounter: Payer: Self-pay | Admitting: Family Medicine

## 2013-07-20 VITALS — BP 125/79 | HR 88 | Temp 97.8°F | Wt 224.0 lb

## 2013-07-20 DIAGNOSIS — F3289 Other specified depressive episodes: Secondary | ICD-10-CM

## 2013-07-20 DIAGNOSIS — G4700 Insomnia, unspecified: Secondary | ICD-10-CM

## 2013-07-20 DIAGNOSIS — F411 Generalized anxiety disorder: Secondary | ICD-10-CM

## 2013-07-20 DIAGNOSIS — F329 Major depressive disorder, single episode, unspecified: Secondary | ICD-10-CM

## 2013-07-20 DIAGNOSIS — F32A Depression, unspecified: Secondary | ICD-10-CM

## 2013-07-20 MED ORDER — ESZOPICLONE 2 MG PO TABS: 2.0000 mg | ORAL_TABLET | Freq: Every evening | ORAL | Status: DC | PRN

## 2013-07-20 NOTE — Progress Notes (Signed)
   Subjective:    Patient ID: Virginia Logan, female    DOB: 06-Oct-1970, 43 y.o.   MRN: [id number]  HPI Has been going to counseling.  She has been working with [employer]. She is Re: had 3 visits and really likes her and feels like it's been very helpful for her. She has been cutting her viibryd in half to 20mg .  She was doing wonderful up until this weekend. Increased irritability.  Advil PM didn't work. Took some Ambien for a week and really help her sleep.  She is currently looking for a new job and has some opportunities that might be coming available. Personally I think this is good for her. I think she really needs to evaluate whether or not her current job situation is healthy for her overall mental health.  Still having difficulty concentrating and overeating. Having trouble relaxing and feels that she's not able to control her worrying. Still feels nervous and on edge several days a week and still feels down and sad several days a week.   Review of Systems     Objective:   Physical Exam  Constitutional: She is oriented to person, place, and time. She appears well-developed and well-nourished.  HENT:  Head: Normocephalic and atraumatic.  Cardiovascular: Normal rate, regular rhythm and normal heart sounds.   Pulmonary/Chest: Effort normal and breath sounds normal.  Neurological: She is alert and oriented to person, place, and time.  Skin: Skin is warm and dry.  Psychiatric: She has a normal mood and affect. Her behavior is normal.          Assessment & Plan:  Depression - Much improved. Will continue viibryd at the 20mg  dose. PHQ 9 score of 15 Gad 7 score of 16.  Continue counseling and therapy.  Insomnia - Susupect increased irritability is from the Ambien. Stop Ambien and will try Lunesta.  Warned about S.E.

## 2013-07-22 ENCOUNTER — Ambulatory Visit (INDEPENDENT_AMBULATORY_CARE_PROVIDER_SITE_OTHER): Payer: Managed Care, Other (non HMO) | Admitting: Psychiatry

## 2013-07-22 ENCOUNTER — Encounter: Payer: Self-pay | Admitting: Family Medicine

## 2013-07-22 ENCOUNTER — Telehealth: Payer: Self-pay | Admitting: *Deleted

## 2013-07-22 DIAGNOSIS — F321 Major depressive disorder, single episode, moderate: Secondary | ICD-10-CM

## 2013-07-22 DIAGNOSIS — F411 Generalized anxiety disorder: Secondary | ICD-10-CM

## 2013-07-22 NOTE — Progress Notes (Signed)
   THERAPIST PROGRESS NOTE  Session Time: 10:00-10:50 pm  Participation Level: Active  Behavioral Response: Neat and Well GroomedAlertAnxious and Depressed  Type of Therapy: Individual Therapy  Treatment Goals addressed: Depression and Job Stress  Interventions: CBT and Supportive  Summary: Virginia Logan is a 43 y.o. female who presents with mild depressed mood and affect. Pt described having a difficult time managing her emotions and said she can get explosive with others and then regrets her behavior later. Pt is questioning if she has Bipolar Disorder due to her mood swing tendencies. Pt said she had a blow up with her husband this past weekend where she felt abandoned by him when he left her alone while shopping in a home improvement store to spend time shopping with their neighbor who also went to the store with them. Pt said she feels her husband spends more time with others than her and she feels neglected by him. Pt said she has felt this way for a long time and that they don't have fun together anymore. Pt said he seems content with the marriage as it is but she wants more time with him and connection. Pt said she is also stressed about having to return to work this Monday into the same stressful situation she left since she has not heard back yet on the jobs she interviewed for.  Pt is worried that her stress and depression will re-escelate. Pt also said there is a stressful situation going on with her HOA where they are challenging Pt for owning a [employer] dog. They are saying she is breaking the home association bylaws. Pt said she is going to fight to not have to give up her dog and is currently petitioning her neighbors to do away with the [place] all together. Pt was open to looking at behaviors she can change to communicate her needs more effectively to her husband instead of assuming he knows what she wants from him and agreed to plan a fun night with him and communicate this  clearly to him so he knows what they are doing as a couple. Pt agreed to try to find ways to survive her week at work next week instead of looking at it as a nightmare to overcome and try to get by day by day until she hears something from one of the other jobs she applied for.    Suicidal/Homicidal: No  Therapist Response: Assessed overall level of functioning and explored how Pts emotions tend to rule her actions and cause regret later, educated on more effective communication to use with her husband and educated on stress management tools to use to help her feel prepared to manage the stress of returning to her job temporarily while looking for other employment. Agreed to talk more about the [place] situation at next session for continued support.   Plan: Return again in 1 weeks. Encouraged weekly counseling to continue supporting Pt with current stressors.   Diagnosis: Axis I: Depression Single Episode Moderate and Generalized Anxiety Disorder, Rule out Bipolar Disorder.        Tresa Res, LCSW 07/22/2013

## 2013-07-23 ENCOUNTER — Encounter: Payer: Self-pay | Admitting: Family Medicine

## 2013-07-24 NOTE — Telephone Encounter (Signed)
Called pt and told her that she will need to wait until Tuesday to get this note done.Virginia Logan, Virginia Logan

## 2013-07-24 NOTE — Telephone Encounter (Signed)
Called and asked pt if she has heard anything from [place], particularly Clarise Cruz. She stated that she has not. I informed her that Clarise Cruz called about a form that I did not have. I told her that the last form that was sent was back on 07/06/13. I also told her that it could be possible that we have the form and it has been sent to scanning but I don't have access to it at this time. Pt stated that she resent this form to Clarise Cruz and that this form was in the previous paperwork that either myself or Maudie Mercury had filled out for her. Pt stated that she could bring the form by our office however, this is not a form that Clarise Cruz needed right now. She informed me that the form Clarise Cruz needed was to help her make accommodations for her working environment, and that Clarise Cruz has nothing to do with the Short term disability. She stated that she would call Clarise Cruz.  Pt is asking for an additional week off until she can be seen by counseling again. She is asking for the following dates 07/27/2013-08/02/2013. I spoke to Maudie Mercury about the extended time that the pt is requesting and she suggested that the pt will need to wait until Dr. Madilyn Fireman returns on Tuesday. Maryruth Eve, [place]

## 2013-07-24 NOTE — Telephone Encounter (Signed)
Clarise Cruz from [place] is calling for a verification of signature on pt's workplace accommodation request form. She is going to fax over the form and would like a return call once received and verified.Audelia Hives [place]

## 2013-07-24 NOTE — Telephone Encounter (Signed)
lvm asking pt for return call regarding form.Virginia Logan [facility]

## 2013-07-24 NOTE — Telephone Encounter (Signed)
I got this request from pt for additional time to be written off for. I feel like this can wait until Tuesday. I am routing this.

## 2013-07-24 NOTE — Telephone Encounter (Signed)
Called Clarise Cruz back and informed her that no one at our office signed this form.Audelia Hives [place]

## 2013-07-27 ENCOUNTER — Encounter: Payer: Self-pay | Admitting: Family Medicine

## 2013-07-28 ENCOUNTER — Encounter: Payer: Self-pay | Admitting: Family Medicine

## 2013-07-28 ENCOUNTER — Telehealth: Payer: Self-pay | Admitting: *Deleted

## 2013-07-28 NOTE — Telephone Encounter (Signed)
Work note given per Dr. Madilyn Fireman from 07/26/13 to 07/30/13. [facility] will take care of disability forms and work notes from this point.  Oscar La, LPN

## 2013-07-29 ENCOUNTER — Encounter (HOSPITAL_COMMUNITY): Payer: Self-pay | Admitting: Psychiatry

## 2013-07-29 ENCOUNTER — Encounter: Payer: Self-pay | Admitting: Family Medicine

## 2013-07-29 ENCOUNTER — Ambulatory Visit (INDEPENDENT_AMBULATORY_CARE_PROVIDER_SITE_OTHER): Payer: Managed Care, Other (non HMO) | Admitting: Psychiatry

## 2013-07-29 VITALS — BP 114/74 | HR 130 | Ht 64.5 in | Wt 224.0 lb

## 2013-07-29 DIAGNOSIS — F321 Major depressive disorder, single episode, moderate: Secondary | ICD-10-CM

## 2013-07-29 DIAGNOSIS — F410 Panic disorder [episodic paroxysmal anxiety] without agoraphobia: Secondary | ICD-10-CM

## 2013-07-29 DIAGNOSIS — F411 Generalized anxiety disorder: Secondary | ICD-10-CM

## 2013-07-29 MED ORDER — VENLAFAXINE HCL ER 37.5 MG PO CP24: ORAL_CAPSULE | ORAL | Status: DC

## 2013-07-29 MED ORDER — VILAZODONE HCL 40 MG PO TABS
ORAL_TABLET | ORAL | Status: DC
Start: 2013-07-29 — End: 2013-08-28

## 2013-07-29 NOTE — Progress Notes (Signed)
Psychiatric Assessment Adult  Patient Identification:  Virginia Logan Date of Evaluation:  07/29/2013 Chief Complaint:    Chief Complaint  Patient presents with  . Follow-up   History of Chief Complaint:  HPI Comments: HPI Comments: Virginia Logan is  a 43 y/o female with a past psychiatric history significant for Generalized Anxiety Disorder, Major Depressive Disorder, single episode, moderate.. The patient is referred for psychiatric services for psychiatric evaluation and medication management.    . Location: The patient reports anxiety. She reports that she is also having issues with disability  . Quality: The patient reports that her main stressors are:  "work"The patient reports she is having severe anxiety at work. She relates some of the anxiety comes from being in a very small and confined work space. She endorses crying spells.   In the area of affective symptoms, patient appears anxious and depressed. Patient denies current suicidal ideation, intent, or plan. Patient denies current homicidal ideation, intent, or plan. Patient denies auditory hallucinations. Patient denies visual hallucinations. Patient denies symptoms of paranoia. Patient states sleep is poor. Appetite is increased. Energy level is poor. Patient endorses symptoms of anhedonia. Patient endorses helplessness, but denies hopelessness or guilt..    . Severity: Depression: 2/10 (0=Very depressed; 5=Neutral; 10=Very Happy)  Anxiety- 7/10 (0=no anxiety; 5= moderate/tolerable anxiety; 10= panic attacks). She reports panic attack with chest pains, shortness of breath, and tinnitus while at work.  . Duration Depression-Post partum 15 years ago-worsening since 05/17/2013 Anxiety . Timing: Had a panic attack Jan. 17 of 2015.    Marland Kitchen Context: Work related stressors.  . Modifying factors:None.  ..    Review of Systems  Constitutional: Positive for appetite change and fatigue. Negative for fever and chills.   Respiratory: Negative for cough, chest tightness, shortness of breath (Only with panic attacks.) and wheezing.   Cardiovascular: Negative for chest pain (Only with panic attacks.), palpitations and leg swelling.  Gastrointestinal: Negative for nausea, vomiting, abdominal pain, diarrhea and constipation.  Neurological: Negative for dizziness, seizures and light-headedness.   Filed Vitals:   07/29/13 1417  BP: 114/74  Pulse: 130  Height: 5' 4.5" (1.638 m)  Weight: 224 lb (101.606 kg)   Physical Exam  Vitals reviewed. Constitutional: She appears well-developed and well-nourished. No distress.  Skin: She is not diaphoretic.    Depressive Symptoms: depressed mood, anhedonia,  (Hypo) Manic Symptoms:   Elevated Mood:  No Irritable Mood:  Yes Grandiosity:  No Distractibility:  Yes Labiality of Mood:  Yes Delusions:  No Hallucinations:  No Impulsivity:  No Sexually Inappropriate Behavior:  No Financial Extravagance:  No Flight of Ideas:  Yes  Anxiety Symptoms: Excessive Worry:  Yes Panic Symptoms:  Yes-in January 2015 Agoraphobia:  Yes Obsessive Compulsive: No  Symptoms: None, Specific Phobias:  No Social Anxiety:  No  Psychotic Symptoms:  Hallucinations: Negative None Delusions:  No Paranoia:  No   Ideas of Reference:  No  PTSD Symptoms: Ever had a traumatic exposure:  Negative Had a traumatic exposure in the last month:  Negative Re-experiencing: Negative None Hypervigilance:  Negative Hyperarousal: Negative None Avoidance: No None  Traumatic Brain Injury: Negative   Past Psychiatric History: Diagnosis:Generalized Anxiety Disorder, Major Depressive Disorder  Hospitalizations: Patient denies  Outpatient Care: Outpatient therapist for about 1 month.  Substance Abuse Care: Patient denies  Self-Mutilation: Patient denies.  Suicidal Attempts: Patient denies  Violent Behaviors: Throwing inanimate objects   Past Medical History:   Past Medical History   Diagnosis Date  . Anemia  History of Loss of Consciousness:  Yes-anemia Seizure History:  No Cardiac History:  No Allergies:   Allergies  Allergen Reactions  . Bactrim [Sulfamethoxazole-Trimethoprim] Anaphylaxis  . Moxifloxacin     REACTION: Rash  . Paxil [Paroxetine Hcl] Other (See Comments)    Excess sedation    Current Medications:  Current Outpatient Prescriptions  Medication Sig Dispense Refill  . albuterol (PROAIR HFA) 108 (90 BASE) MCG/ACT inhaler Inhale 2 puffs into the lungs every 6 (six) hours as needed for wheezing or shortness of breath.  1 Inhaler  2  . clonazePAM (KLONOPIN) 0.5 MG disintegrating tablet Take 1 tablet (0.5 mg total) by mouth 2 (two) times daily as needed for seizure.  20 tablet  0  . cyclobenzaprine (FLEXERIL) 10 MG tablet Take 1 tablet (10 mg total) by mouth at bedtime as needed for muscle spasms.  30 tablet  0  . eszopiclone (LUNESTA) 2 MG TABS tablet Take 1 tablet (2 mg total) by mouth at bedtime as needed for sleep. Take immediately before bedtime  30 tablet  0  . Ibuprofen-Diphenhydramine HCl (ADVIL PM) 200-25 MG CAPS Take by mouth.        . Vilazodone HCl (VIIBRYD) 40 MG TABS Take 1 tablet (40 mg total) by mouth daily.  30 tablet  3   No current facility-administered medications for this visit.    Previous Psychotropic Medications:  Medication Dose   Sertraline-was working as well. Has been taking for 15 years  150 mg  Vilazadone-did worse with increase of dose   Clonazepam   Pristiq-worked but had more crying spells-but more level   Venlafaxine-didn't   Has not taken prozac. Substance Abuse History in the last 12 months: History   Social History  . Marital Status: Married    Spouse Name: N/A    Number of Children: N/A  . Years of Education: N/A   Social History Main Topics  . Smoking status: Former Smoker -- 0.30 packs/day    Types: Cigarettes  . Smokeless tobacco: None  . Alcohol Use: No  . Drug Use: No     Comment:  Caffeine: Coffee 2-3 cupsper day.   Marland Kitchen Sexual Activity: Yes    Partners: Male     Comment: Patient denies sexual side effects.   Other Topics Concern  . None   Social History Narrative  . None     Medical Consequences of Substance Abuse: Patient denies  Legal Consequences of Substance Abuse: Patient denies  Family Consequences of Substance Abuse:Patient denies  Blackouts:  Negative DT's:  Negative Withdrawal Symptoms:  NA None  Social History: Current Place of Residence: [place], [place] Place of Birth:[place] Family Members: Mother, father, husband and son are here in [place], [place] Marital Status:  Married Children:1  Sons: 1 Relationships: Husband is her main source of emotional support and some good friends Education:  Dentist Problems/Performance:Fair Religious Beliefs/Practices: Yes History of Abuse: none Occupational Experiences: At [employer]. Military History:  None. Legal History: Investigation currently forgery.  Hobbies/Interests: Read, plays, with dog, spends time with her son.  Family History:   Family History  Problem Relation Age of Onset  . Diabetes Mother   . Hyperlipidemia Mother   . Heart attack Mother   . Cancer Father     prostate  . Graves' disease Father   . Crohn's disease Brother    Psychiatric Specialty Exam: General Appearance: Casual, Fairly Groomed and Well Groomed  Eye Contact::  Good  Speech:  Clear and Coherent and Normal  Rate  Volume:  Normal  Mood:  "hopeless"  Affect:  Labile  Thought Process:  Coherent, Linear and Logical  Orientation:  Full (Time, Place, and Person)  Thought Content:  WDL  Suicidal Thoughts:  No  Homicidal Thoughts:  No  Memory:  Immediate;   Good Recent;   Good Remote;   Good  Judgement:  Good  Insight:  Good  Psychomotor Activity:  Normal  Concentration:  Good  Recall:  Good  Akathisia:  No  Handed:  Right  AIMS (if indicated):     Assets:  Communication  Skills Housing Intimacy Social Support   language intact and comprehensive    fund of knowledge is good   Sleep:  Number of Hours: 0     Laboratory/X-Ray Psychological Evaluation(s)   None  None   Assessment:   AXIS I Generalized Anxiety Disorder and Panic Disorder Major depressive Disorder, single episode, moderate   AXIS II No diagnosis  AXIS III Past Medical History  Diagnosis Date  . Anemia      AXIS IV occupational problems  AXIS V 41-50 serious symptoms   Treatment Plan/Recommendations:  Plan of Care:  PLAN:  1. Affirm with the patient that the medications are taken as ordered. Patient  expressed understanding of how their medications were to be used.    Laboratory:   No labs warranted at this time.   Psychotherapy: Therapy: brief supportive therapy provided.  Discussed psychosocial stressors in detail.  Continue individual therapy.  Medications:  Start the following psychiatric medications as written prior to this appointment with the following changes::  a) Viibryd b) Effexor 37.5 mg daily. Take one capsule for 7 days, then 2 capsules for 7 days, then 3 capsules for 7 days, then 4 capsules daily.  -Risks and benefits, side effects and alternatives discussed with patient, she was given an opportunity to ask questions about his/her medication, illness, and treatment. All current psychiatric medications have been reviewed and discussed with the patient and adjusted as clinically appropriate. The patient has been provided an accurate and updated list of the medications being now prescribed.   Routine PRN Medications:  Negative  Consultations: The patient was encouraged to keep all PCP and specialty clinic appointments.   Safety Concerns:   Patient told to call clinic if any problems occur. Patient advised to go to  ER  if she should develop SI/HI, side effects, or if symptoms worsen. Has crisis numbers to call if needed.    Other:   8. Patient was instructed to  return to clinic in 1 months.  9. The patient was advised to call and cancel their mental health appointment within 24 hours of the appointment, if they are unable to keep the appointment, as well as the three no show and termination from clinic policy. 10. The patient expressed understanding of the plan and agrees with the above. 11. Will complete FMLA form and recommend leave of absence for 4 weeks as we are adjusting her medications. She must continue therapy at that time.  12 Advised patient I would no longer be at this practice after September 09, 2013.  Time Spent: 60 minutes  Coralyn Helling, MD 3/4/20152:11 PM

## 2013-07-30 ENCOUNTER — Encounter: Payer: Self-pay | Admitting: Family Medicine

## 2013-07-30 ENCOUNTER — Ambulatory Visit (INDEPENDENT_AMBULATORY_CARE_PROVIDER_SITE_OTHER): Payer: Managed Care, Other (non HMO) | Admitting: Psychiatry

## 2013-07-30 ENCOUNTER — Telehealth (HOSPITAL_COMMUNITY): Payer: Self-pay | Admitting: Psychiatry

## 2013-07-30 DIAGNOSIS — F321 Major depressive disorder, single episode, moderate: Secondary | ICD-10-CM

## 2013-07-30 NOTE — Telephone Encounter (Signed)
Writer informed Pt that Dr. Josephine Igo requested that Pt keep her counseling appointment today with writer in order for him to be able to evaluate Pt for completing leave paperwork for her job. Pt had called the office this morning to cancel her therapy appointment saying she "did not feel well". Pt was informed that in order to complete any necessary paperwork on Pts behalf that she would need to be compliant with treatments and attend appointments. Writer requested Pt return the phone call.

## 2013-07-30 NOTE — Progress Notes (Signed)
   THERAPIST PROGRESS NOTE  Session Time: 10:00-10:50 am   Participation Level: Active  Behavioral Response: CasualAlertStressed  Type of Therapy: Individual Therapy  Treatment Goals addressed: Depression and Job Stress  Interventions: CBT and Solution Focused  Summary: Virginia Logan is a 43 y.o. female who presents with high stress associated with having her leave paperwork for [employer]. Pt said her primary stressor is getting approved leave from her current job with [employer]. [name] with Dr. Josephine Igo and was informed that he would not be able to complete her leave paperwork on an initial consultation session. [name] this morning for this appointment but agreed to come in when she was informed that he would not consider completing her leave paperwork if she was noncompliant with attending therapy appointments. [name] on her current leave situation by saying that Dr. Suzi Roots had written Pt off from work through March 1 and Pt has been trying to get in touch with their office to extend her leave but struggled to get return phone calls from the office. Pt said she was persistent and went into the office several times to have the paperwork completed. Pt said she was then accused of forging Dr. Morrie Sheldon signature on the leave paperwork that was sent over to her job with [employer] and all this occurred while Dr. Madilyn Fireman was out on leave from work.  Pts job contacted Dr. Gardiner Ramus office and confronted them with this situation of forgery and Pts job is currently examining the situation through various departments. Pt is unsure how the doctors name got on the forms but [name]. Pt said she needs to have someone extend her leave paperwork to avoid her from losing her job because that would financially devastate her family. Pt said she has already had two job interviews with [facility] and another with another company and is  awaiting their responses. Pt said she has another job interview on Monday with another company. [name] that Dr. Josephine Igo will extend her leave paperwork to preserve her current job until she finds alternate employment. Pt said she does not plan on returning to her present job. Pt agreed to attend therapy consistently.   Suicidal/Homicidal: No  Therapist Response: Assessed overall level of functioning per Pt self report, gathered updated information pertaining to Pts leave from work, educated on the importance of using therapy sessions to help Pt either gain skills to return to her current job or seek alternate employment, reinforced the need to attend counseling consistently and reviewed the attendance policy. Therapist will update Dr. Josephine Igo on Pts current status.   Plan: Return again in 1 week.  Diagnosis: Axis I: Depression Single Epside Moderate and Generalized Anxiety Disorder      Tresa Res, LCSW 07/30/2013

## 2013-07-31 ENCOUNTER — Encounter: Payer: Self-pay | Admitting: Family Medicine

## 2013-07-31 DIAGNOSIS — F410 Panic disorder [episodic paroxysmal anxiety] without agoraphobia: Secondary | ICD-10-CM | POA: Insufficient documentation

## 2013-08-05 ENCOUNTER — Ambulatory Visit (INDEPENDENT_AMBULATORY_CARE_PROVIDER_SITE_OTHER): Payer: Managed Care, Other (non HMO) | Admitting: Psychiatry

## 2013-08-05 DIAGNOSIS — F321 Major depressive disorder, single episode, moderate: Secondary | ICD-10-CM

## 2013-08-05 DIAGNOSIS — F411 Generalized anxiety disorder: Secondary | ICD-10-CM

## 2013-08-05 NOTE — Progress Notes (Signed)
   THERAPIST PROGRESS NOTE  Session Time: 10:00-10:50 am  Participation Level: Active  Behavioral Response: Neat and Well GroomedAlertAnxious and Depressed  Type of Therapy: Individual Therapy  Treatment Goals addressed: Depression, Anxiety and Job Stress  Interventions: CBT, Solution Focused and Supportive  Summary: Virginia Logan is a 43 y.o. female who presents with moderate depressed mood and tearful affect. Pt said she had tried to reschedule this appointment due to a conflict with another doctors appointment but chose to attend this one. 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 Pt said she has another interview this week for a position with [employer]. Pt is hoping one of these jobs comes through so she does not have to return to her current job with [employer]. Pt then became tearful and said that her husband admitted last week that he was having an emotional affair with their neighbor. Pt processed feelings of sadness, anger and uncertainty about the future of their marriage and said she wants to make the marriage work, but knows it has been strained for a long time. Pt said she is also currently financially dependent on him since she is not working. Pt said she is still able to function through the depression and stress and is making herself get up every day and remain busy. Pt said she was open to the idea of marriage counseling and gave Probation officer her e-mail address to send the contact information for a therapist to her this week.   Suicidal/Homicidal: No  Therapist Response: Assessed overall level of depression and anxiety per Pt self report. 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 Reviewed depression coping skills and suggested marriage counseling.   Plan: Return again in 1 week. Make a referral for Pt for a marriage counseling and send over e-mail before next session.   Diagnosis: Axis I: Depression Single Episode Moderate and Generalized Anxiety Disorder        Tresa Res, LCSW 08/05/2013

## 2013-08-11 ENCOUNTER — Encounter: Payer: Self-pay | Admitting: Family Medicine

## 2013-08-12 ENCOUNTER — Ambulatory Visit (HOSPITAL_COMMUNITY): Payer: Self-pay | Admitting: Psychiatry

## 2013-08-12 ENCOUNTER — Telehealth (HOSPITAL_COMMUNITY): Payer: Self-pay | Admitting: Psychiatry

## 2013-08-12 NOTE — Telephone Encounter (Signed)
Pt returned writers call regarding her missed counseling appointment today at 10:00 am. Pt said she thought her appointment was tomorrow. Pt was informed of any further missed appointments would result in termination of counseling services with this practice.

## 2013-08-14 ENCOUNTER — Telehealth: Payer: Self-pay | Admitting: Family Medicine

## 2013-08-14 NOTE — Telephone Encounter (Signed)
FYI. Pt called and asked me to cancel her appts for 4/24 & 5/4.  She stated she  will be signing medical release to see another doctor.

## 2013-08-19 ENCOUNTER — Ambulatory Visit (HOSPITAL_COMMUNITY): Payer: Self-pay | Admitting: Psychiatry

## 2013-08-24 ENCOUNTER — Other Ambulatory Visit (HOSPITAL_COMMUNITY): Payer: Self-pay | Admitting: Psychiatry

## 2013-08-24 ENCOUNTER — Other Ambulatory Visit: Payer: Self-pay | Admitting: Family Medicine

## 2013-08-26 ENCOUNTER — Ambulatory Visit (HOSPITAL_COMMUNITY): Payer: Self-pay | Admitting: Psychiatry

## 2013-08-28 ENCOUNTER — Other Ambulatory Visit: Payer: Self-pay | Admitting: Family Medicine

## 2013-08-28 NOTE — Telephone Encounter (Signed)
Called patient. She is dong well on Effexor 150 mg. Will provide 3 refills.  Patient informed that April 15th, 2015 would be my last day at this clinic.

## 2013-09-02 ENCOUNTER — Ambulatory Visit (HOSPITAL_COMMUNITY): Payer: Self-pay | Admitting: Psychiatry

## 2013-09-09 ENCOUNTER — Ambulatory Visit (HOSPITAL_COMMUNITY): Payer: Self-pay | Admitting: Psychiatry

## 2013-09-16 ENCOUNTER — Ambulatory Visit (HOSPITAL_COMMUNITY): Payer: Self-pay | Admitting: Psychiatry

## 2013-09-18 ENCOUNTER — Ambulatory Visit: Payer: Self-pay | Admitting: Family Medicine

## 2013-09-23 ENCOUNTER — Ambulatory Visit (HOSPITAL_COMMUNITY): Payer: Self-pay | Admitting: Psychiatry

## 2013-09-28 ENCOUNTER — Encounter: Payer: Self-pay | Admitting: Family Medicine

## 2013-09-30 ENCOUNTER — Ambulatory Visit (HOSPITAL_COMMUNITY): Payer: Self-pay | Admitting: Psychiatry

## 2013-10-02 ENCOUNTER — Telehealth (HOSPITAL_COMMUNITY): Payer: Self-pay

## 2013-10-05 NOTE — Telephone Encounter (Signed)
Writer spoke briefly to the doctor, but he did not have the Pts chart available to discuss Pt. He agreed to call writer back at a later time. This Pt is not currently active to writer due to termination of services.

## 2013-10-07 ENCOUNTER — Ambulatory Visit (HOSPITAL_COMMUNITY): Payer: Self-pay | Admitting: Psychiatry

## 2013-10-08 ENCOUNTER — Encounter: Payer: Self-pay | Admitting: Family Medicine

## 2013-10-08 MED ORDER — TRAZODONE HCL 50 MG PO TABS: ORAL_TABLET | ORAL | Status: DC

## 2013-10-09 ENCOUNTER — Emergency Department (INDEPENDENT_AMBULATORY_CARE_PROVIDER_SITE_OTHER)
Admission: EM | Admit: 2013-10-09 | Discharge: 2013-10-09 | Disposition: A | Discharge status: Home / Self Care | Payer: Managed Care, Other (non HMO) | Source: Home / Self Care | Attending: Family Medicine | Admitting: Family Medicine

## 2013-10-09 ENCOUNTER — Encounter: Payer: Self-pay | Admitting: Emergency Medicine

## 2013-10-09 DIAGNOSIS — R112 Nausea with vomiting, unspecified: Secondary | ICD-10-CM

## 2013-10-09 LAB — POCT CBC W AUTO DIFF (K'VILLE URGENT CARE)

## 2013-10-09 LAB — POCT URINALYSIS DIP (MANUAL ENTRY)
BILIRUBIN UA: NEGATIVE
Bilirubin, UA: NEGATIVE
GLUCOSE UA: NEGATIVE
Leukocytes, UA: NEGATIVE
Nitrite, UA: NEGATIVE
Protein Ur, POC: NEGATIVE
RBC UA: NEGATIVE
SPEC GRAV UA: 1.02 (ref 1.005–1.03)
UROBILINOGEN UA: 0.2 (ref 0–1)
pH, UA: 7 (ref 5–8)

## 2013-10-09 MED ORDER — ONDANSETRON HCL 4 MG/2ML IJ SOLN
4.0000 mg | Freq: Once | INTRAMUSCULAR | Status: AC
Start: 2013-10-09 — End: 2013-10-09
  Administered 2013-10-09: 4 mg via INTRAMUSCULAR

## 2013-10-09 MED ORDER — ONDANSETRON 4 MG PO TBDP
ORAL_TABLET | ORAL | Status: DC
Start: 2013-10-09 — End: 2014-03-23

## 2013-10-09 NOTE — ED Notes (Signed)
Virginia Logan c/o vomiting and HA x this AM. Abdominal pain only before vomiting goes across her abdomen, described as sharp. Denies fever.

## 2013-10-09 NOTE — ED Provider Notes (Signed)
CSN: [id number]     Arrival date & time 10/09/13  1242 History   None    Chief Complaint  Patient presents with  . Emesis      HPI Comments: Patient developed nausea, headache, sweats, and fatigue last night.  This morning she had several episodes of vomiting preceded by abdominal pain (relieved after vomiting).  No diarrhea, and bowel movements have been normal.  She is able to take fluids.  Patient is a 43 y.o. female presenting with vomiting. The history is provided by the patient.  Emesis Severity:  Mild Duration:  6 hours Timing:  Intermittent Quality:  Stomach contents Able to tolerate:  Liquids Progression:  Improving Chronicity:  New Recent urination:  Normal Relieved by:  Nothing Worsened by:  Nothing tried Ineffective treatments:  None tried Associated symptoms: abdominal pain and chills   Associated symptoms: no arthralgias, no cough, no diarrhea, no fever, no headaches, no myalgias, no sore throat and no URI   Risk factors: no sick contacts, no suspect food intake and no travel to endemic areas     Past Medical History  Diagnosis Date  . Anemia    Past Surgical History  Procedure Laterality Date  . Left ankle     Family History  Problem Relation Age of Onset  . Diabetes Mother   . Hyperlipidemia Mother   . Heart attack Mother   . Cancer Father     prostate  . Graves' disease Father   . Depression Father   . Anxiety disorder Father   . Crohn's disease Brother   . Dementia Maternal Aunt   . Dementia Maternal Uncle   . Dementia Paternal Grandmother   . Bipolar disorder Neg Hx   . Schizophrenia Neg Hx   . OCD Neg Hx   . Alcohol abuse Neg Hx   . Drug abuse Neg Hx    History  Substance Use Topics  . Smoking status: Former Smoker -- 0.30 packs/day    Types: Cigarettes  . Smokeless tobacco: Not on file  . Alcohol Use: No   OB History   Grav Para Term Preterm Abortions TAB SAB Ect Mult Living                 Review of Systems  Constitutional:  Positive for chills.  HENT: Negative for sore throat.   Gastrointestinal: Positive for vomiting and abdominal pain. Negative for diarrhea.  Musculoskeletal: Negative for arthralgias and myalgias.  Neurological: Negative for headaches.  All other systems reviewed and are negative.   Allergies  Bactrim; Ambien; Moxifloxacin; and Paxil  Home Medications   Prior to Admission medications   Medication Sig Start Date End Date Taking? Authorizing Provider  albuterol (PROAIR HFA) 108 (90 BASE) MCG/ACT inhaler Inhale 2 puffs into the lungs every 6 (six) hours as needed for wheezing or shortness of breath. 04/29/13   Jade L Breeback, PA-C  Ibuprofen-Diphenhydramine HCl (ADVIL PM) 200-25 MG CAPS Take by mouth.      Historical Provider, MD  traZODone (DESYREL) 50 MG tablet TAKE 1/2 TO 1 TABLET AT BEDTIME AS NEEDED FOR SLEEP 10/08/13   Hali Marry, MD  venlafaxine XR (EFFEXOR-XR) 150 MG 24 hr capsule Take 1 capsule (150 mg total) by mouth daily with breakfast.    Waldon Merl, MD   BP 136/94  Pulse 82  Temp(Src) 97.6 F (36.4 C) (Oral)  Resp 18  SpO2 100%  LMP 10/02/2013 Physical Exam Nursing notes and Vital Signs reviewed. Appearance:  Patient appears healthy, stated age, and in no acute distress Eyes:  Pupils are equal, round, and reactive to light and accomodation.  Extraocular movement is intact.  Conjunctivae are not inflamed  Ears:  Canals normal.  Tympanic membranes normal.  Nose: Normal turbinates.  No sinus tenderness.    Pharynx:  Normal; moist mucous membranes  Neck:  Supple.  No adenopathy Lungs:  Clear to auscultation.  Breath sounds are equal.  Heart:  Regular rate and rhythm without murmurs, rubs, or gallops.  Abdomen:  Nontender without masses or hepatosplenomegaly.  Bowel sounds are present.  No CVA or flank tenderness.  Extremities:  No edema.  No calf tenderness Skin:  No rash present.   ED Course  Procedures  none    Labs Reviewed  POCT CBC W AUTO DIFF  ([facility]):  WBC 10.3; LY 13.9; MO 1.1; GR 85.0; Hgb 13.3; Platelets 298   POCT URINALYSIS DIP (MANUAL ENTRY):  SG = 1.020; otherwise negative          MDM   1. Nausea with vomiting; suspect viral gastroenteritis.  Normal white blood count reassuring.   Zofran 4mg  IM.  Rx for Zofran ODT 4mg  Begin clear liquids (Pedialyte if diarrhea develops) until improved, then advance to a [employer] (Bananas, Rice, Applesauce, Toast).  Then gradually resume a regular diet when tolerated.  Avoid milk products until well.    If symptoms become significantly worse during the night or over the weekend, proceed to the local emergency room.     Kandra Nicolas, MD 10/13/13 [phone]

## 2013-10-09 NOTE — Discharge Instructions (Signed)
Begin clear liquids (Pedialyte if diarrhea develops) until improved, then advance to a [employer] (Bananas, Rice, Applesauce, Toast).  Then gradually resume a regular diet when tolerated.  Avoid milk products until well.    If symptoms become significantly worse during the night or over the weekend, proceed to the local emergency room.

## 2013-10-14 ENCOUNTER — Ambulatory Visit (HOSPITAL_COMMUNITY): Payer: Self-pay | Admitting: Psychiatry

## 2013-10-21 ENCOUNTER — Ambulatory Visit (HOSPITAL_COMMUNITY): Payer: Self-pay | Admitting: Psychiatry

## 2013-10-29 ENCOUNTER — Ambulatory Visit (HOSPITAL_COMMUNITY): Payer: Self-pay | Admitting: Psychiatry

## 2013-11-18 ENCOUNTER — Ambulatory Visit (INDEPENDENT_AMBULATORY_CARE_PROVIDER_SITE_OTHER): Payer: Managed Care, Other (non HMO) | Admitting: Psychiatry

## 2013-11-18 VITALS — HR 80 | Ht 64.0 in | Wt 204.0 lb

## 2013-11-18 DIAGNOSIS — F410 Panic disorder [episodic paroxysmal anxiety] without agoraphobia: Secondary | ICD-10-CM

## 2013-11-18 DIAGNOSIS — F331 Major depressive disorder, recurrent, moderate: Secondary | ICD-10-CM

## 2013-11-18 DIAGNOSIS — F321 Major depressive disorder, single episode, moderate: Secondary | ICD-10-CM

## 2013-11-18 DIAGNOSIS — F411 Generalized anxiety disorder: Secondary | ICD-10-CM

## 2013-11-18 MED ORDER — TRAZODONE HCL 150 MG PO TABS: ORAL_TABLET | ORAL | Status: DC

## 2013-11-18 MED ORDER — VENLAFAXINE HCL ER 150 MG PO CP24: 150.0000 mg | ORAL_CAPSULE | Freq: Every day | ORAL | Status: DC

## 2013-11-18 NOTE — Progress Notes (Signed)
Patient ID: Virginia Logan, female   DOB: 20-Apr-1971, 43 y.o.   MRN: [id number] [place] Follow-up Outpatient Visit  Virginia Logan [id number] 43 y.o.  11/18/2013  Chief Complaint: anxiety and stress.     History of Present Illness:   Patient returns for Medication Follow up and is diagnosed with Major depressive disorder moderate and generalized anxiety disorder.  Last visit vibryd was discontinued. She was started on Effexor now she is build up the dose to 150 mg. She is also taking trazodone at a dose of 150 mg. The medications are working in regard to depression and anxiety  Context of depression related to marital discord. Her husband started texting a neighbour which led to conflicts and this is leading towards a divorce. She started having panic attacks palpitations feeling of doom. Effexor has helped with the symptoms is not endorsing panic attacks. Does not endorse hopelessness suicidal or homicidal thoughts  Aggravating factors of marital discord. Finances. She quit her job she was working at [employer] at that time. Modifying factors. Family support and medications. Severity of depresion now. 8/10. 10 being happy. Anxiety 2/10. 10 being v anxious. Duration: On and off for last 15 years since Postpartum.  Has been on wellbutrin, zoloft and other meds in the past. There is no side effects from current meds.   Past Medical History  Diagnosis Date  . Anemia    Family History  Problem Relation Age of Onset  . Diabetes Mother   . Hyperlipidemia Mother   . Heart attack Mother   . Cancer Father     prostate  . Graves' disease Father   . Depression Father   . Anxiety disorder Father   . Crohn's disease Brother   . Dementia Maternal Aunt   . Dementia Maternal Uncle   . Dementia Paternal Grandmother   . Bipolar disorder Neg Hx   . Schizophrenia Neg Hx   . OCD Neg Hx   . Alcohol abuse Neg Hx   . Drug abuse Neg Hx     Outpatient Encounter Prescriptions as of  11/18/2013  Medication Sig  . albuterol (PROAIR HFA) 108 (90 BASE) MCG/ACT inhaler Inhale 2 puffs into the lungs every 6 (six) hours as needed for wheezing or shortness of breath.  . Ibuprofen-Diphenhydramine HCl (ADVIL PM) 200-25 MG CAPS Take by mouth.    . ondansetron (ZOFRAN ODT) 4 MG disintegrating tablet Take one tab by mouth Q6hr prn nausea  . traZODone (DESYREL) 150 MG tablet Take one at night prn.  . venlafaxine XR (EFFEXOR-XR) 150 MG 24 hr capsule Take 1 capsule (150 mg total) by mouth daily with breakfast.  . [DISCONTINUED] traZODone (DESYREL) 50 MG tablet TAKE 1/2 TO 1 TABLET AT BEDTIME AS NEEDED FOR SLEEP  . [DISCONTINUED] venlafaxine XR (EFFEXOR-XR) 150 MG 24 hr capsule Take 1 capsule (150 mg total) by mouth daily with breakfast.    Recent Results (from the past 2160 hour(s))  POCT URINALYSIS DIP (MANUAL ENTRY)     Status: None   Collection Time    10/09/13  1:29 PM      Result Value Ref Range   Color, UA yellow     Clarity, UA clear     Glucose, UA neg     Bilirubin, UA negative     Bilirubin, UA negative     Spec Grav, UA 1.020  1.005 - 1.03   Blood, UA negative     pH, UA 7.0  5 - 8   Protein  Ur, POC negative     Urobilinogen, UA 0.2  0 - 1   Nitrite, UA Negative     Leukocytes, UA Negative    POCT CBC W AUTO DIFF ([facility])     Status: None   Collection Time    10/09/13  1:30 PM      Result Value Ref Range   WBC    4.5 - 10.5 K/uL   Comment: see attached report   Lymphocytes relative %    15 - 45 %   Monocytes relative %    2 - 10 %   Neutrophils relative % (GR)    44 - 76 %   Lymphocytes absolute    0.1 - 1.8 K/uL   Monocyes absolute    0.1 - 1 K/uL   Neutrophils absolute (GR#)    1.7 - 7.8 K/uL   RBC    3.8 - 5.1 MIL/uL   Hemoglobin    11.8 - 15.5 g/dL   Hematocrit    34.8 - 46 %   MCV    78 - 100 fL   MCH    26 - 32 pg   MCHC    32 - 36.5 g/dL   RDW    11.6 - 14 %   Platelet count    140 - 400 K/uL   MPV    7.8 - 11 fL    Pulse 80  Ht  5\' 4"  (1.626 m)  Wt 204 lb (92.534 kg)  BMI 35.00 kg/m2   Review of Systems  Constitutional: Negative.   HENT: Negative.   Gastrointestinal: Negative for nausea, vomiting and diarrhea.  Skin: Negative.   Psychiatric/Behavioral: The patient is nervous/anxious.     Mental Status Examination  Appearance: casual Alert: Yes Attention: fair  Cooperative: Yes Eye Contact: Fair Speech: normal tone Psychomotor Activity: Decreased Memory/Concentration: adequate Oriented: person, place, time/date and situation Mood: Dysphoric Affect: Congruent Thought Processes and Associations: Coherent Fund of Knowledge: Fair Thought Content: Suicidal ideation and Homicidal ideation were denied Insight: Fair Judgement: Fair  Diagnosis: Maj. depressive disorder recurrent moderate. Improving. Generalized anxiety disorder improving panic disorder improving  Treatment Plan:   Continue Effexor 150 mg continue trazodone. We talked about cutting down the dose of trazodone down the road and discussed and reviewed sleep hygiene  Pertinent Labs and Relevant Prior Notes reviewed. Medication Side effects, benefits and risks reviewed/discussed with Patient. Time given for patient to respond and asks questions regarding the Diagnosis and Medications. Safety concerns and to report to ER if suicidal or call 911. Relevant Medications refilled or called in to pharmacy. Discussed weight maintenance and Sleep Hygiene. Follow up with Primary care provider in regards to Medical conditions. Recommend compliance with medications and follow up office appointments. Discussed to avail opportunity to consider or/and continue Individual therapy with Counselor. Greater than 50% of time was spend in counseling and coordination of care with the patient.  Schedule for Follow up visit in 2 months  or call in earlier as necessary.  Merian Capron, MD 11/18/2013

## 2013-12-25 ENCOUNTER — Encounter (HOSPITAL_COMMUNITY): Payer: Self-pay

## 2014-01-14 ENCOUNTER — Telehealth (HOSPITAL_COMMUNITY): Payer: Self-pay | Admitting: Psychiatry

## 2014-01-14 MED ORDER — TRAZODONE HCL 150 MG PO TABS: ORAL_TABLET | ORAL | Status: DC

## 2014-01-14 NOTE — Telephone Encounter (Signed)
trazadone refill sent.

## 2014-01-20 ENCOUNTER — Ambulatory Visit (HOSPITAL_COMMUNITY): Payer: Self-pay | Admitting: Psychiatry

## 2014-01-21 ENCOUNTER — Ambulatory Visit (INDEPENDENT_AMBULATORY_CARE_PROVIDER_SITE_OTHER): Payer: Managed Care, Other (non HMO) | Admitting: Psychiatry

## 2014-01-21 ENCOUNTER — Encounter (HOSPITAL_COMMUNITY): Payer: Self-pay | Admitting: Psychiatry

## 2014-01-21 VITALS — BP 105/83 | HR 89 | Ht 64.0 in | Wt 202.0 lb

## 2014-01-21 DIAGNOSIS — F411 Generalized anxiety disorder: Secondary | ICD-10-CM

## 2014-01-21 DIAGNOSIS — F331 Major depressive disorder, recurrent, moderate: Secondary | ICD-10-CM

## 2014-01-21 DIAGNOSIS — F410 Panic disorder [episodic paroxysmal anxiety] without agoraphobia: Secondary | ICD-10-CM

## 2014-01-21 DIAGNOSIS — F321 Major depressive disorder, single episode, moderate: Secondary | ICD-10-CM

## 2014-01-21 MED ORDER — TRAZODONE HCL 150 MG PO TABS: ORAL_TABLET | ORAL | Status: DC

## 2014-01-21 MED ORDER — VENLAFAXINE HCL ER 150 MG PO CP24: 150.0000 mg | ORAL_CAPSULE | Freq: Every day | ORAL | Status: DC

## 2014-01-21 NOTE — Progress Notes (Signed)
Patient ID: Virginia Logan, female   DOB: [birth year], 43 y.o.   MRN: [id number] [place] Follow-up Outpatient Visit  Virginia Logan [id number] 43 y.o.  01/21/2014  Chief Complaint: anxiety and stress.     History of Present Illness:   Patient returns for Medication Follow up and is diagnosed with Major depressive disorder moderate and generalized anxiety disorder. She continues to take Effexor and Zoloft 100 mg. We talked about cutting down the trazodone so that she can work on sleep hygiene. She can go to bed late but when she tries to cut down on the trazodone she cannot sleep. Other factors indulging in life is her dad's sickness in the hospital because of pneumonia he may be coming back in one week. Patient is currently divorced so that conflict is over. She is currently living with her mom and 65 year old son is living with her. Overall  family issues including sickness of her parents does affect her mood.  Context of depression related to marital discord. Her husband started texting a neighbour which led to conflicts and this is leading towards a divorce. Parents sickness.  She started having panic attacks palpitations feeling of doom in the past,  Effexor has helped with the symptoms is not endorsing panic attacks. Does not endorse hopelessness suicidal or homicidal thoughts  Aggravating factors of marital discord. Finances. Parents sickness She quit her job she was working at [employer] at that time. Modifying factors. Family support and medications. Severity of depresion now. 8/10. 10 being happy. Anxiety 2/10. 10 being v anxious. Duration: On and off for last 15 years since Postpartum.  Has been on wellbutrin, zoloft and other meds in the past. There is no side effects from current meds.   Past Medical History  Diagnosis Date  . Anemia    Family History  Problem Relation Age of Onset  . Diabetes Mother   . Hyperlipidemia Mother   . Heart attack Mother   . Cancer  Father     prostate  . Graves' disease Father   . Depression Father   . Anxiety disorder Father   . Crohn's disease Brother   . Dementia Maternal Aunt   . Dementia Maternal Uncle   . Dementia Paternal Grandmother   . Bipolar disorder Neg Hx   . Schizophrenia Neg Hx   . OCD Neg Hx   . Alcohol abuse Neg Hx   . Drug abuse Neg Hx     Outpatient Encounter Prescriptions as of 01/21/2014  Medication Sig  . albuterol (PROAIR HFA) 108 (90 BASE) MCG/ACT inhaler Inhale 2 puffs into the lungs every 6 (six) hours as needed for wheezing or shortness of breath.  . Ibuprofen-Diphenhydramine HCl (ADVIL PM) 200-25 MG CAPS Take by mouth.    . ondansetron (ZOFRAN ODT) 4 MG disintegrating tablet Take one tab by mouth Q6hr prn nausea  . traZODone (DESYREL) 150 MG tablet Take one at night prn.  . venlafaxine XR (EFFEXOR-XR) 150 MG 24 hr capsule Take 1 capsule (150 mg total) by mouth daily with breakfast.  . [DISCONTINUED] traZODone (DESYREL) 150 MG tablet Take one at night prn.  . [DISCONTINUED] venlafaxine XR (EFFEXOR-XR) 150 MG 24 hr capsule Take 1 capsule (150 mg total) by mouth daily with breakfast.    No results found for this or any previous visit (from the past 2160 hour(s)).  BP 105/83  Pulse 89  Ht 5\' 4"  (1.626 m)  Wt 202 lb (91.627 kg)  BMI 34.66 kg/m2   Review of  Systems  Constitutional: Negative.   HENT: Negative.   Gastrointestinal: Negative for nausea, vomiting and diarrhea.  Skin: Negative.   Neurological: Negative for dizziness and tremors.  Psychiatric/Behavioral: Positive for depression. Negative for suicidal ideas, hallucinations and substance abuse. The patient is nervous/anxious and has insomnia.     Mental Status Examination  Appearance: casual Alert: Yes Attention: fair  Cooperative: Yes Eye Contact: Fair Speech: normal tone Psychomotor Activity: Decreased Memory/Concentration: adequate Oriented: person, place, time/date and situation Mood: Dysphoric Affect:  Congruent Thought Processes and Associations: Coherent Fund of Knowledge: Fair Thought Content: Suicidal ideation and Homicidal ideation were denied Insight: Fair Judgement: Fair  Diagnosis: Maj. depressive disorder recurrent moderate. Improving. Generalized anxiety disorder improving panic disorder improving  Treatment Plan:   Continue Effexor 150 mg continue trazodone. Has a dose of trazodone did not help. Reinstate Trazadone 150mg  discussed sleep hygiene again. She has a lot of psychosocial issues that she is going through including her parents sickness. Pertinent Labs and Relevant Prior Notes reviewed. Medication Side effects, benefits and risks reviewed/discussed with Patient. Time given for patient to respond and asks questions regarding the Diagnosis and Medications. Safety concerns and to report to ER if suicidal or call 911. Relevant Medications refilled or called in to pharmacy. Discussed weight maintenance and Sleep Hygiene. Follow up with Primary care provider in regards to Medical conditions. Recommend compliance with medications and follow up office appointments. Discussed to avail opportunity to consider or/and continue Individual therapy with Counselor. Greater than 50% of time was spend in counseling and coordination of care with the patient.  Schedule for Follow up visit in 2 months  or call in earlier as necessary.  Merian Capron, MD 01/21/2014

## 2014-02-16 ENCOUNTER — Telehealth (HOSPITAL_COMMUNITY): Payer: Self-pay | Admitting: *Deleted

## 2014-02-16 NOTE — Telephone Encounter (Signed)
Spoke with pharmacy the prescriptions for Venlafaxine HCL ER 150 mg  and Trazodone 150 mg are currenlty filled and ready for pt to pickup on 9/22.

## 2014-03-23 ENCOUNTER — Encounter (HOSPITAL_COMMUNITY): Payer: Self-pay | Admitting: Psychiatry

## 2014-03-23 ENCOUNTER — Ambulatory Visit (INDEPENDENT_AMBULATORY_CARE_PROVIDER_SITE_OTHER): Payer: Managed Care, Other (non HMO) | Admitting: Psychiatry

## 2014-03-23 VITALS — BP 128/78 | HR 88 | Ht 64.0 in | Wt 212.0 lb

## 2014-03-23 DIAGNOSIS — F321 Major depressive disorder, single episode, moderate: Secondary | ICD-10-CM

## 2014-03-23 DIAGNOSIS — F331 Major depressive disorder, recurrent, moderate: Secondary | ICD-10-CM

## 2014-03-23 DIAGNOSIS — F410 Panic disorder [episodic paroxysmal anxiety] without agoraphobia: Secondary | ICD-10-CM

## 2014-03-23 DIAGNOSIS — F411 Generalized anxiety disorder: Secondary | ICD-10-CM

## 2014-03-23 MED ORDER — VENLAFAXINE HCL ER 150 MG PO CP24: 150.0000 mg | ORAL_CAPSULE | Freq: Every day | ORAL | Status: DC

## 2014-03-23 MED ORDER — TRAZODONE HCL 100 MG PO TABS: ORAL_TABLET | ORAL | Status: DC

## 2014-03-23 NOTE — Progress Notes (Signed)
Patient ID: Virginia Logan, female   DOB: [birth year], 43 y.o.   MRN: [id number] [place] Follow-up Outpatient Visit  Virginia Logan [id number] 43 y.o.  03/23/2014  Chief Complaint: anxiety and stress.     History of Present Illness:   Patient returns for Medication Follow up and is diagnosed with Major depressive disorder moderate and generalized anxiety disorder. She continues to take Effexor and trazadone. We talked about cutting down the trazodone so that she can work on sleep hygiene. She can go to bed late but when she tries to cut down on the trazodone she cannot sleep. Other factors indulging in life is her dad's sickness in the hospital because of pneumonia he may be coming back in one week. Patient is currently divorced so that conflict is over. She is currently living with her mom and 80 year old son is living with her. Overall  family issues including sickness of her parents does affect her mood. Improved on meds not want to take 150mg  trazadone as its triangular in shape.  Context of depression related to marital discord. Her husband started texting a neighbour which led to conflicts and this is leading towards a divorce. Parents sickness. Things are getting better with her husband and they may get back together.   She started having panic attacks palpitations feeling of doom in the past,  Effexor has helped with the symptoms is not endorsing panic attacks. Does not endorse hopelessness suicidal or homicidal thoughts  Aggravating factors of marital discord. Finances. Parents sickness She quit her job she was working at [employer] at that time. Modifying factors. Family support and medications. Severity of depresion now. 8/10. 10 being happy. Anxiety 2/10. 10 being v anxious. Duration: On and off for last 15 years since Postpartum.  Has been on wellbutrin, zoloft and other meds in the past. There is no side effects from current meds.   Past Medical History  Diagnosis  Date  . Anemia    Family History  Problem Relation Age of Onset  . Diabetes Mother   . Hyperlipidemia Mother   . Heart attack Mother   . Cancer Father     prostate  . Graves' disease Father   . Depression Father   . Anxiety disorder Father   . Crohn's disease Brother   . Dementia Maternal Aunt   . Dementia Maternal Uncle   . Dementia Paternal Grandmother   . Bipolar disorder Neg Hx   . Schizophrenia Neg Hx   . OCD Neg Hx   . Alcohol abuse Neg Hx   . Drug abuse Neg Hx     Outpatient Encounter Prescriptions as of 03/23/2014  Medication Sig  . albuterol (PROAIR HFA) 108 (90 BASE) MCG/ACT inhaler Inhale 2 puffs into the lungs every 6 (six) hours as needed for wheezing or shortness of breath.  . Ibuprofen-Diphenhydramine HCl (ADVIL PM) 200-25 MG CAPS Take by mouth.    . traZODone (DESYREL) 100 MG tablet Take one at night  . venlafaxine XR (EFFEXOR-XR) 150 MG 24 hr capsule Take 1 capsule (150 mg total) by mouth daily with breakfast.  . [DISCONTINUED] traZODone (DESYREL) 150 MG tablet Take one at night prn.  . [DISCONTINUED] venlafaxine XR (EFFEXOR-XR) 150 MG 24 hr capsule Take 1 capsule (150 mg total) by mouth daily with breakfast.  . [DISCONTINUED] ondansetron (ZOFRAN ODT) 4 MG disintegrating tablet Take one tab by mouth Q6hr prn nausea    No results found for this or any previous visit (from the past 2160 hour(s)).  BP 128/78  Pulse 88  Ht 5\' 4"  (1.626 m)  Wt 212 lb (96.163 kg)  BMI 36.37 kg/m2   Review of Systems  Constitutional: Negative.   HENT: Negative.   Gastrointestinal: Negative for nausea and vomiting.  Skin: Negative.   Neurological: Negative for dizziness and tremors.  Psychiatric/Behavioral: Negative for depression, suicidal ideas, hallucinations and substance abuse. The patient has insomnia. The patient is not nervous/anxious.     Mental Status Examination  Appearance: casual Alert: Yes Attention: fair  Cooperative: Yes Eye Contact: Fair Speech:  normal tone Psychomotor Activity: Decreased Memory/Concentration: adequate Oriented: person, place, time/date and situation Mood: Dysphoric Affect: Congruent Thought Processes and Associations: Coherent Fund of Knowledge: Fair Thought Content: Suicidal ideation and Homicidal ideation were denied Insight: Fair Judgement: Fair  Diagnosis: Maj. depressive disorder recurrent moderate. Improving. Generalized anxiety disorder improving panic disorder improving  Treatment Plan:   Continue Effexor 150 mg continue trazodone. Lower trazadone to 100mg  and round ill. She has a lot of psychosocial issues that she is going through including her parents sickness but has been able to deal with them better. Pertinent Labs and Relevant Prior Notes reviewed. Medication Side effects, benefits and risks reviewed/discussed with Patient. Time given for patient to respond and asks questions regarding the Diagnosis and Medications. Safety concerns and to report to ER if suicidal or call 911. Relevant Medications refilled or called in to pharmacy. Discussed weight maintenance and Sleep Hygiene. Follow up with Primary care provider in regards to Medical conditions. Recommend compliance with medications and follow up office appointments. Discussed to avail opportunity to consider or/and continue Individual therapy with Counselor. Greater than 50% of time was spend in counseling and coordination of care with the patient.  Schedule for Follow up visit in 2 months  or call in earlier as necessary.  Merian Capron, MD 03/23/2014

## 2014-04-02 ENCOUNTER — Ambulatory Visit (INDEPENDENT_AMBULATORY_CARE_PROVIDER_SITE_OTHER): Payer: Managed Care, Other (non HMO) | Admitting: Psychiatry

## 2014-04-02 ENCOUNTER — Encounter (HOSPITAL_COMMUNITY): Payer: Self-pay | Admitting: Psychiatry

## 2014-04-02 VITALS — BP 142/90 | HR 96 | Ht 64.0 in | Wt 214.0 lb

## 2014-04-02 DIAGNOSIS — F411 Generalized anxiety disorder: Secondary | ICD-10-CM

## 2014-04-02 DIAGNOSIS — F321 Major depressive disorder, single episode, moderate: Secondary | ICD-10-CM

## 2014-04-02 DIAGNOSIS — F410 Panic disorder [episodic paroxysmal anxiety] without agoraphobia: Secondary | ICD-10-CM

## 2014-04-02 NOTE — Progress Notes (Signed)
Patient ID: Virginia Logan, female   DOB: 08-20-1970, 43 y.o.   MRN: [id number] [place] Follow-up Outpatient Visit  Christianne Zacher [id number] 43 y.o.  04/02/2014  Chief Complaint: anxiety and stress.     History of Present Illness:   Patient returns for Medication Follow up and is diagnosed with Major depressive disorder moderate and generalized anxiety disorder.   She continues to take Effexor and trazadone. 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 This has led to anamosity with other co-workers and she feels that they are putting her in pressure and giving her a hard time. Dentist has wrote a letter to follow up for stress and evaluation in regard to her ability to work. This upset her as she was doing her job to improve customer service but this has led to grouping amongst other co -workers.   Patient is currently divorced so that conflict is over. She is currently living with her mom and 81 year old son is living with her. Overall  family issues including sickness of her parents does affect her mood.  Improved on meds we have cut down trazadone last visit since she sleeps better.  Less overswhelmed with family issues and more concerned about her job today.   Context of depression related to marital discord. Her husband started texting a neighbour which led to conflicts and this is leading towards a divorce. Parents sickness. Things are getting better with her husband and they may get back together.   She started having panic attacks palpitations feeling of doom in the past,  Effexor has helped with the symptoms is not endorsing panic attacks. Does not endorse hopelessness suicidal or homicidal thoughts She does not otherwise endorse any worsening of other psychosocial issue.   Aggravating factors of marital discord. Finances. Parents sickness  She quit her job she was working at [employer] at that time. Modifying factors. Family support and medications. Severity of depresion now. 6/10. 10 being happy. Anxiety 2/10. 10 being v anxious. Duration: On and off for last 15 years since Postpartum.  Has been on wellbutrin, zoloft and other meds in the past. There is no side effects from current meds.   Past Medical History  Diagnosis Date  . Anemia    Family History  Problem Relation Age of Onset  . Diabetes Mother   . Hyperlipidemia Mother   . Heart attack Mother   . Cancer Father     prostate  . Graves' disease Father   . Depression Father   . Anxiety disorder Father   . Crohn's disease Brother   . Dementia Maternal Aunt   . Dementia Maternal Uncle   . Dementia Paternal Grandmother   . Bipolar disorder Neg Hx   . Schizophrenia Neg Hx   . OCD Neg Hx   . Alcohol abuse Neg Hx   . Drug abuse Neg Hx     Outpatient Encounter Prescriptions as of 04/02/2014  Medication Sig  . albuterol (PROAIR HFA) 108 (90 BASE) MCG/ACT inhaler Inhale 2 puffs into the lungs every 6 (six) hours as needed for wheezing or shortness of breath.  . Ibuprofen-Diphenhydramine HCl (ADVIL PM) 200-25 MG CAPS Take by mouth.    . traZODone (DESYREL) 100 MG tablet Take one at night  . venlafaxine XR (EFFEXOR-XR) 150 MG 24 hr capsule Take 1 capsule (150 mg total) by mouth daily with breakfast.  No results found for this or any previous visit (from the past 2160 hour(s)).  BP 142/90 mmHg  Pulse 96  Ht 5\' 4"  (1.626 m)  Wt 214 lb (97.07 kg)  BMI 36.72 kg/m2   Review of Systems  HENT: Negative.   Respiratory: Negative for cough.   Cardiovascular: Negative for chest pain.  Gastrointestinal: Negative for nausea.  Skin: Negative.   Neurological: Negative for dizziness, tremors and headaches.  Psychiatric/Behavioral: Positive for depression. Negative for suicidal ideas, hallucinations and substance abuse. The patient has insomnia. The patient is not  nervous/anxious.     Mental Status Examination  Appearance: casual Alert: Yes Attention: fair  Cooperative: Yes Eye Contact: Fair Speech: normal tone Psychomotor Activity: Decreased Memory/Concentration: adequate Oriented: person, place, time/date and situation Mood: Dysphoric Affect: Congruent Thought Processes and Associations: Coherent Fund of Knowledge: Fair Thought Content: Suicidal ideation and Homicidal ideation were denied Insight: Fair Judgement: Fair  Diagnosis: Maj. depressive disorder recurrent moderate. Improving. Generalized anxiety disorder improving panic disorder improving  Treatment Plan:   Continue Effexor 150 mg continue trazodone.  I will write letter in regard to her depression and that it has been manageable with her current medications.  Would benefit with therapy related to her psychosical issues. Other then that it is between her and the staff people to relate to each  Others concerns.  Pertinent Labs and Relevant Prior Notes reviewed. Medication Side effects, benefits and risks reviewed/discussed with Patient. Time given for patient to respond and asks questions regarding the Diagnosis and Medications. Safety concerns and to report to ER if suicidal or call 911. Relevant Medications refilled or called in to pharmacy. Discussed weight maintenance and Sleep Hygiene. Follow up with Primary care provider in regards to Medical conditions. Recommend compliance with medications and follow up office appointments. Discussed to avail opportunity to consider or/and continue Individual therapy with Counselor. Greater than 50% of time was spend in counseling and coordination of care with the patient.  Schedule for Follow up visit in 1 months  or call in earlier as necessary.  Merian Capron, MD 04/02/2014

## 2014-04-12 ENCOUNTER — Ambulatory Visit (HOSPITAL_COMMUNITY): Payer: Self-pay | Admitting: Licensed Clinical Social Worker

## 2014-04-30 ENCOUNTER — Ambulatory Visit (INDEPENDENT_AMBULATORY_CARE_PROVIDER_SITE_OTHER): Payer: Managed Care, Other (non HMO) | Admitting: Psychiatry

## 2014-04-30 ENCOUNTER — Encounter (HOSPITAL_COMMUNITY): Payer: Self-pay | Admitting: Psychiatry

## 2014-04-30 VITALS — BP 143/92 | HR 88 | Ht 64.0 in | Wt 212.0 lb

## 2014-04-30 DIAGNOSIS — F410 Panic disorder [episodic paroxysmal anxiety] without agoraphobia: Secondary | ICD-10-CM

## 2014-04-30 DIAGNOSIS — F411 Generalized anxiety disorder: Secondary | ICD-10-CM

## 2014-04-30 DIAGNOSIS — F331 Major depressive disorder, recurrent, moderate: Secondary | ICD-10-CM

## 2014-04-30 DIAGNOSIS — F321 Major depressive disorder, single episode, moderate: Secondary | ICD-10-CM

## 2014-04-30 MED ORDER — TRAZODONE HCL 100 MG PO TABS: ORAL_TABLET | ORAL | Status: DC

## 2014-04-30 MED ORDER — VENLAFAXINE HCL ER 150 MG PO CP24: 150.0000 mg | ORAL_CAPSULE | Freq: Every day | ORAL | Status: DC

## 2014-04-30 NOTE — Progress Notes (Signed)
Patient ID: Virginia Logan, female   DOB: 02/13/1971, 43 y.o.   MRN: [id number] [name] Follow-up Outpatient Visit  Virginia Logan [id number] 43 y.o.  04/30/2014  Chief Complaint: anxiety and stress.     History of Present Illness:   Patient returns for Medication Follow up and is diagnosed with Major depressive disorder moderate and generalized anxiety disorder.   She continues to take Effexor and trazadone. 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 It was related to him scheduling concern and other staff members or coworkers did not like it involves a Pharmacist, community and she had come in for an early visit because of extreme anxiety and stress. I have written a letter that she would benefit if she continues to work. Apparently the next week after she was at "because of financial reasons. She is feeling more comfortable with this reason to be led gone rather than not able to do her job. She is tolerating medication increase trazodone is ASLEEP she is looking forward to get another job and is moving forward with one endorse hopelessness helplessness.   Patient is currently divorced so that conflict is over. She is currently living with her mom and 54 year old son is living with her. Overall  family issues including sickness of her parents does affect her mood.  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   Context of depression related to marital discord. Her husband started texting a neighbour which led to conflicts and this is leading towards a divorce. Parents sickness. Things are getting better with her husband and they may get back together.   She started having panic attacks palpitations feeling of doom in the past,  Effexor has helped with the symptoms is not endorsing panic attacks. Does not endorse hopelessness suicidal or homicidal thoughts She does not otherwise endorse any worsening of other psychosocial issue.    Aggravating factors of marital discord. Finances. Parents sickness She quit her job she was working at [employer] at that time. Modifying factors. Family support and medications. Severity of depresion now. 6/10. 10 being happy. Anxiety 2/10. 10 being v anxious. Duration: On and off for last 15 years since Postpartum.  Has been on wellbutrin, zoloft and other meds in the past. There is no side effects from current meds.   Past Medical History  Diagnosis Date  . Anemia    Family History  Problem Relation Age of Onset  . Diabetes Mother   . Hyperlipidemia Mother   . Heart attack Mother   . Cancer Father     prostate  . Graves' disease Father   . Depression Father   . Anxiety disorder Father   . Crohn's disease Brother   . Dementia Maternal Aunt   . Dementia Maternal Uncle   . Dementia Paternal Grandmother   . Bipolar disorder Neg Hx   . Schizophrenia Neg Hx   . OCD Neg Hx   . Alcohol abuse Neg Hx   . Drug abuse Neg Hx     Outpatient Encounter Prescriptions as of 04/30/2014  Medication Sig  . albuterol (PROAIR HFA) 108 (90 BASE) MCG/ACT inhaler Inhale 2 puffs into the lungs every 6 (six) hours as needed for wheezing or shortness of breath.  . Ibuprofen-Diphenhydramine HCl (ADVIL PM) 200-25 MG CAPS Take by mouth.    . traZODone (DESYREL) 100 MG tablet Take one at night  . venlafaxine XR (EFFEXOR-XR) 150 MG 24 hr capsule Take 1 capsule (150 mg total)  by mouth daily with breakfast.    No results found for this or any previous visit (from the past 2160 hour(s)).  BP 143/92 mmHg  Pulse 88  Ht 5\' 4"  (1.626 m)  Wt 212 lb (96.163 kg)  BMI 36.37 kg/m2   Review of Systems  Constitutional: Negative for fever.  Respiratory: Negative for cough.   Cardiovascular: Negative for chest pain.  Neurological: Negative for dizziness and headaches.  Psychiatric/Behavioral: Negative for depression, suicidal ideas, hallucinations and substance abuse. The patient has insomnia. The patient is not  nervous/anxious.     Mental Status Examination  Appearance: casual Alert: Yes Attention: fair  Cooperative: Yes Eye Contact: Fair Speech: normal tone Psychomotor Activity: Decreased Memory/Concentration: adequate Oriented: person, place, time/date and situation Mood: euthymic Affect: Congruent Thought Processes and Associations: Coherent Fund of Knowledge: Fair Thought Content: Suicidal ideation and Homicidal ideation were denied Insight: Fair Judgement: Fair  Diagnosis: Maj. depressive disorder recurrent moderate. Improving. Generalized anxiety disorder improving panic disorder improving  Treatment Plan:   Continue Effexor and trazodone. She will look forward to get a job. As of now she is concerned about financial issues. But she is not overwhelmed and that there is no reported side effects.  Pertinent Labs and Relevant Prior Notes reviewed. Medication Side effects, benefits and risks reviewed/discussed with Patient. Time given for patient to respond and asks questions regarding the Diagnosis and Medications. Safety concerns and to report to ER if suicidal or call 911. Relevant Medications refilled or called in to pharmacy. Discussed weight maintenance and Sleep Hygiene. Follow up with Primary care provider in regards to Medical conditions. Recommend compliance with medications and follow up office appointments. Discussed to avail opportunity to consider or/and continue Individual therapy with Counselor. Greater than 50% of time was spend in counseling and coordination of care with the patient.  Schedule for Follow up visit in 1 months  or call in earlier as necessary.  Merian Capron, MD 04/30/2014

## 2014-05-14 ENCOUNTER — Ambulatory Visit (HOSPITAL_COMMUNITY): Payer: Self-pay | Admitting: Psychiatry

## 2014-06-18 ENCOUNTER — Other Ambulatory Visit (HOSPITAL_COMMUNITY): Payer: Self-pay | Admitting: Psychiatry

## 2014-06-18 DIAGNOSIS — F411 Generalized anxiety disorder: Secondary | ICD-10-CM

## 2014-06-18 DIAGNOSIS — F321 Major depressive disorder, single episode, moderate: Secondary | ICD-10-CM

## 2014-06-23 NOTE — Telephone Encounter (Signed)
I spoke to [place] at [facility]). Per Lonn Georgia, pt does not any refills remaining on file for Venlafaxine (Effexor-XR) 150mg . Per Dr. De Nurse, pt is authorized for 1 refill for Venlafaxine (Effexor-XR) 150mg , Qty 30. Pt has f/u appt on 2/4. Pt verbally states and shows understanding.

## 2014-07-01 ENCOUNTER — Encounter (HOSPITAL_COMMUNITY): Payer: Self-pay | Admitting: Psychiatry

## 2014-07-01 ENCOUNTER — Ambulatory Visit (INDEPENDENT_AMBULATORY_CARE_PROVIDER_SITE_OTHER): Payer: Managed Care, Other (non HMO) | Admitting: Psychiatry

## 2014-07-01 VITALS — HR 88 | Ht 64.0 in | Wt 202.0 lb

## 2014-07-01 DIAGNOSIS — F321 Major depressive disorder, single episode, moderate: Secondary | ICD-10-CM

## 2014-07-01 DIAGNOSIS — F410 Panic disorder [episodic paroxysmal anxiety] without agoraphobia: Secondary | ICD-10-CM

## 2014-07-01 DIAGNOSIS — F411 Generalized anxiety disorder: Secondary | ICD-10-CM

## 2014-07-01 MED ORDER — TRAZODONE HCL 50 MG PO TABS: ORAL_TABLET | ORAL | Status: DC

## 2014-07-01 MED ORDER — VENLAFAXINE HCL ER 150 MG PO CP24: 150.0000 mg | ORAL_CAPSULE | Freq: Every day | ORAL | Status: DC

## 2014-07-01 NOTE — Progress Notes (Signed)
Patient ID: Virginia Logan, female   DOB: 1971-03-25, 44 y.o.   MRN: [id number] [facility] Follow-up Outpatient Visit  Virginia Logan [id number] 44 y.o.  07/01/2014  Chief Complaint: anxiety and stress.     History of Present Illness:   Patient returns for Medication Follow up and is diagnosed with Major depressive disorder moderate and generalized anxiety disorder.  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 She had to leave last in disorders because of conflicts and then she was told to leave. That upset her but now she is looking forward for her new job. Things with her husband is leading to her divorced which is where he finalized in June.  She feels comfortable current medication regimen and adjustment with no reported side effects.   Patient is currently divorced so that conflict is over. She is currently living with her mom and 2 year old son is living with her. Overall  family issues including sickness of her parents does affect her mood.  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   Context of depression related to marital discord. Her husband started texting a neighbour which led to conflicts and this is leading towards a divorce. Parents sickness.   She started having panic attacks palpitations feeling of doom in the past,  Effexor has helped with the symptoms is not endorsing panic attacks. Does not endorse hopelessness suicidal or homicidal thoughts She does not otherwise endorse any worsening of other psychosocial issue.   Aggravating factors of marital discord. Finances. Parents sickness She quit her job she was working at [employer] at that time. Modifying factors. Family support and medications. Severity of depresion now. 7/10. 10 being happy. Anxiety 2/10. 10 being v anxious. Duration: On and off for last 15 years since Postpartum.  Has been on wellbutrin, zoloft and  other meds in the past. There is no side effects from current meds.   Past Medical History  Diagnosis Date  . Anemia    Family History  Problem Relation Age of Onset  . Diabetes Mother   . Hyperlipidemia Mother   . Heart attack Mother   . Cancer Father     prostate  . Graves' disease Father   . Depression Father   . Anxiety disorder Father   . Crohn's disease Brother   . Dementia Maternal Aunt   . Dementia Maternal Uncle   . Dementia Paternal Grandmother   . Bipolar disorder Neg Hx   . Schizophrenia Neg Hx   . OCD Neg Hx   . Alcohol abuse Neg Hx   . Drug abuse Neg Hx     Outpatient Encounter Prescriptions as of 07/01/2014  Medication Sig  . albuterol (PROAIR HFA) 108 (90 BASE) MCG/ACT inhaler Inhale 2 puffs into the lungs every 6 (six) hours as needed for wheezing or shortness of breath.  . Ibuprofen-Diphenhydramine HCl (ADVIL PM) 200-25 MG CAPS Take by mouth.    . traZODone (DESYREL) 50 MG tablet Take one at night  . venlafaxine XR (EFFEXOR-XR) 150 MG 24 hr capsule Take 1 capsule (150 mg total) by mouth daily with breakfast.  . venlafaxine XR (EFFEXOR-XR) 150 MG 24 hr capsule Take 1 capsule (150 mg total) by mouth daily.  . [DISCONTINUED] traZODone (DESYREL) 100 MG tablet Take one at night  . [DISCONTINUED] venlafaxine XR (EFFEXOR-XR) 150 MG 24 hr capsule TAKE 1 CAPSULE (150 MG TOTAL) BY MOUTH DAILY WITH BREAKFAST.  No results found for this or any previous visit (from the past 2160 hour(s)).  Pulse 88  Ht 5\' 4"  (1.626 m)  Wt 202 lb (91.627 kg)  BMI 34.66 kg/m2   Review of Systems  Constitutional: Negative.   Cardiovascular: Negative for chest pain.  Gastrointestinal: Negative for nausea.  Neurological: Negative for tremors.    Mental Status Examination  Appearance: casual Alert: Yes Attention: fair  Cooperative: Yes Eye Contact: Fair Speech: normal tone Psychomotor Activity: Decreased Memory/Concentration: adequate Oriented: person, place, time/date and  situation Mood: euthymic Affect: Congruent Thought Processes and Associations: Coherent Fund of Knowledge: Fair Thought Content: Suicidal ideation and Homicidal ideation were denied Insight: Fair Judgement: Fair  Diagnosis: Maj. depressive disorder recurrent moderate. Improving. Generalized anxiety disorder improving panic disorder improving  Treatment Plan:   Continue Effexor and trazodone. She will look forward to get a job. As of now she is concerned about financial issues. But she is not overwhelmed and that there is no reported side effects. Wants to come back in 3 months since she will start a new job.  Pertinent Labs and Relevant Prior Notes reviewed. Medication Side effects, benefits and risks reviewed/discussed with Patient. Time given for patient to respond and asks questions regarding the Diagnosis and Medications. Safety concerns and to report to ER if suicidal or call 911. Relevant Medications refilled or called in to pharmacy. Discussed weight maintenance and Sleep Hygiene. Follow up with Primary care provider in regards to Medical conditions. Recommend compliance with medications and follow up office appointments. Discussed to avail opportunity to consider or/and continue Individual therapy with Counselor. Greater than 50% of time was spend in counseling and coordination of care with the patient.  Schedule for Follow up visit in 3 months  or call in earlier as necessary.  Merian Capron, MD 07/01/2014

## 2014-07-10 ENCOUNTER — Other Ambulatory Visit (HOSPITAL_COMMUNITY): Payer: Self-pay | Admitting: Psychiatry

## 2014-07-16 ENCOUNTER — Emergency Department (INDEPENDENT_AMBULATORY_CARE_PROVIDER_SITE_OTHER)
Admission: EM | Admit: 2014-07-16 | Discharge: 2014-07-16 | Disposition: A | Discharge status: Home / Self Care | Payer: Managed Care, Other (non HMO) | Source: Home / Self Care | Attending: Family Medicine | Admitting: Family Medicine

## 2014-07-16 ENCOUNTER — Encounter: Payer: Self-pay | Admitting: *Deleted

## 2014-07-16 DIAGNOSIS — J069 Acute upper respiratory infection, unspecified: Secondary | ICD-10-CM

## 2014-07-16 DIAGNOSIS — J9801 Acute bronchospasm: Secondary | ICD-10-CM

## 2014-07-16 DIAGNOSIS — B9789 Other viral agents as the cause of diseases classified elsewhere: Principal | ICD-10-CM

## 2014-07-16 MED ORDER — PREDNISONE 20 MG PO TABS
20.0000 mg | ORAL_TABLET | Freq: Two times a day (BID) | ORAL | Status: DC
Start: 2014-07-16 — End: 2014-09-23

## 2014-07-16 MED ORDER — ALBUTEROL SULFATE HFA 108 (90 BASE) MCG/ACT IN AERS: 2.0000 | INHALATION_SPRAY | Freq: Four times a day (QID) | RESPIRATORY_TRACT | Status: DC | PRN

## 2014-07-16 MED ORDER — AMOXICILLIN 875 MG PO TABS: 875.0000 mg | ORAL_TABLET | Freq: Two times a day (BID) | ORAL | Status: DC

## 2014-07-16 MED ORDER — BENZONATATE 200 MG PO CAPS: 200.0000 mg | ORAL_CAPSULE | Freq: Every day | ORAL | Status: DC

## 2014-07-16 NOTE — ED Provider Notes (Signed)
CSN: [id number]     Arrival date & time 07/16/14  1742 History   First MD Initiated Contact with Patient 07/16/14 1849     Chief Complaint  Patient presents with  . Nasal Congestion  . Cough      HPI Comments: Patient reports that about 2.5 weeks ago she developed increased sinus congestion and sneezing that gradually resolved. Patient has now developed four day history of typical cold-like symptoms including mild sore throat, sinus congestion, hoarseness, fatigue, and cough.  She has had wheezing and shortness of breath with activity.  She had a history of asthma as a child.  The history is provided by the patient.    Past Medical History  Diagnosis Date  . Anemia    Past Surgical History  Procedure Laterality Date  . Left ankle     Family History  Problem Relation Age of Onset  . Diabetes Mother   . Hyperlipidemia Mother   . Heart attack Mother   . Cancer Father     prostate  . Graves' disease Father   . Depression Father   . Anxiety disorder Father   . Crohn's disease Brother   . Dementia Maternal Aunt   . Dementia Maternal Uncle   . Dementia Paternal Grandmother   . Bipolar disorder Neg Hx   . Schizophrenia Neg Hx   . OCD Neg Hx   . Alcohol abuse Neg Hx   . Drug abuse Neg Hx    History  Substance Use Topics  . Smoking status: Former Smoker -- 0.30 packs/day    Types: Cigarettes  . Smokeless tobacco: Not on file  . Alcohol Use: No   OB History    No data available     Review of Systems + sore throat + hoarse + cough No pleuritic pain + wheezing + nasal congestion + post-nasal drainage No sinus pain/pressure No itchy/red eyes ? Earache, ears feel clogged + dizzy No hemoptysis + SOB No fever/chills No nausea No vomiting No abdominal pain No diarrhea No urinary symptoms No skin rash + fatigue + myalgias No headache Used OTC meds without relief  Allergies  Bactrim; Ambien; Moxifloxacin; and Paxil  Home Medications   Prior to Admission  medications   Medication Sig Start Date End Date Taking? Authorizing Provider  albuterol (PROAIR HFA) 108 (90 BASE) MCG/ACT inhaler Inhale 2 puffs into the lungs every 6 (six) hours as needed for wheezing or shortness of breath. 07/16/14   Kandra Nicolas, MD  amoxicillin (AMOXIL) 875 MG tablet Take 1 tablet (875 mg total) by mouth 2 (two) times daily. (Rx void after 07/24/14) 07/16/14   Kandra Nicolas, MD  benzonatate (TESSALON) 200 MG capsule Take 1 capsule (200 mg total) by mouth at bedtime. Take as needed for cough 07/16/14   Kandra Nicolas, MD  predniSONE (DELTASONE) 20 MG tablet Take 1 tablet (20 mg total) by mouth 2 (two) times daily. Take with food. 07/16/14   Kandra Nicolas, MD  traZODone (DESYREL) 50 MG tablet Take one at night 07/01/14   Merian Capron, MD  venlafaxine XR (EFFEXOR-XR) 150 MG 24 hr capsule Take 1 capsule (150 mg total) by mouth daily. 07/01/14   Merian Capron, MD   BP 113/74 mmHg  Pulse 91  Temp(Src) 98.3 F (36.8 C) (Oral)  Ht 5\' 4"  (1.626 m)  Wt 208 lb (94.348 kg)  BMI 35.69 kg/m2  SpO2 98% Physical Exam Nursing notes and Vital Signs reviewed. Appearance:  Patient appears stated  age, and in no acute distress.  Patient is obese (BMI 35.7) Eyes:  Pupils are equal, round, and reactive to light and accomodation.  Extraocular movement is intact.  Conjunctivae are not inflamed  Ears:  Canals normal.  Tympanic membranes normal.  Nose:  Mildly congested turbinates.  No sinus tenderness.   Pharynx:  Normal Neck:  Supple.   Tender enlarged posterior nodes are palpated bilaterally  Lungs:  Bilateral posterior expiratory wheezes are present.  Breath sounds are equal.  Heart:  Regular rate and rhythm without murmurs, rubs, or gallops.  Abdomen:  Nontender without masses or hepatosplenomegaly.  Bowel sounds are present.  No CVA or flank tenderness.  Extremities:  No edema.  No calf tenderness Skin:  No rash present.    ED Course  Procedures  none     MDM   1. Viral URI  with cough   2. Bronchospasm, acute    Begin prednisone burst.  Prescription written for Benzonatate (Tessalon) to take at bedtime for night-time cough.  Refill albuterol MDI Take plain guaifenesin 1200mg  (Mucinex) twice daily, with plenty of water, for cough and congestion.  May add Pseudoephedrine for sinus congestion.  Get adequate rest.   May use Afrin nasal spray (or generic oxymetazoline) twice daily for about 5 days.  Also recommend using saline nasal spray several times daily and saline nasal irrigation (AYR is a common brand).  Use Flonase spray after using Afrin spray and saline irrigation. Continue albuterol inhaler as needed. Try warm salt water gargles for sore throat.  Stop all antihistamines for now, and other non-prescription cough/cold preparations. Begin Amoxicillin if not improving about one week or if persistent fever develops (Given a prescription to hold, with an expiration date)  Follow-up with family doctor if not improving about10 days.     Kandra Nicolas, MD 07/21/14 [phone]

## 2014-07-16 NOTE — Discharge Instructions (Signed)
Take plain guaifenesin 1200mg  (Mucinex) twice daily, with plenty of water, for cough and congestion.  May add Pseudoephedrine for sinus congestion.  Get adequate rest.   May use Afrin nasal spray (or generic oxymetazoline) twice daily for about 5 days.  Also recommend using saline nasal spray several times daily and saline nasal irrigation (AYR is a common brand).  Use Flonase spray after using Afrin spray and saline irrigation. Continue albuterol inhaler as needed. Try warm salt water gargles for sore throat.  Stop all antihistamines for now, and other non-prescription cough/cold preparations. Begin Amoxicillin if not improving about one week or if persistent fever develops    Follow-up with family doctor if not improving about10 days.

## 2014-09-20 ENCOUNTER — Other Ambulatory Visit (HOSPITAL_COMMUNITY): Payer: Self-pay | Admitting: Psychiatry

## 2014-09-23 ENCOUNTER — Encounter: Payer: Self-pay | Admitting: Family Medicine

## 2014-09-23 ENCOUNTER — Ambulatory Visit (INDEPENDENT_AMBULATORY_CARE_PROVIDER_SITE_OTHER): Payer: Managed Care, Other (non HMO) | Admitting: Family Medicine

## 2014-09-23 ENCOUNTER — Other Ambulatory Visit (HOSPITAL_COMMUNITY): Payer: Self-pay | Admitting: *Deleted

## 2014-09-23 VITALS — BP 133/75 | HR 103 | Ht 64.0 in | Wt 210.0 lb

## 2014-09-23 DIAGNOSIS — R062 Wheezing: Secondary | ICD-10-CM

## 2014-09-23 DIAGNOSIS — R635 Abnormal weight gain: Secondary | ICD-10-CM

## 2014-09-23 DIAGNOSIS — Z9289 Personal history of other medical treatment: Secondary | ICD-10-CM

## 2014-09-23 DIAGNOSIS — J019 Acute sinusitis, unspecified: Secondary | ICD-10-CM

## 2014-09-23 DIAGNOSIS — N912 Amenorrhea, unspecified: Secondary | ICD-10-CM

## 2014-09-23 DIAGNOSIS — Z13220 Encounter for screening for lipoid disorders: Secondary | ICD-10-CM | POA: Diagnosis not present

## 2014-09-23 DIAGNOSIS — J4541 Moderate persistent asthma with (acute) exacerbation: Secondary | ICD-10-CM

## 2014-09-23 MED ORDER — AZITHROMYCIN 250 MG PO TABS
ORAL_TABLET | ORAL | Status: DC
Start: 2014-09-23 — End: 2015-11-28

## 2014-09-23 MED ORDER — TRAZODONE HCL 50 MG PO TABS: ORAL_TABLET | ORAL | Status: DC

## 2014-09-23 MED ORDER — ALBUTEROL SULFATE (2.5 MG/3ML) 0.083% IN NEBU
2.5000 mg | INHALATION_SOLUTION | Freq: Once | RESPIRATORY_TRACT | Status: AC
  Administered 2014-09-23: 2.5 mg via RESPIRATORY_TRACT

## 2014-09-23 MED ORDER — PREDNISONE 20 MG PO TABS: 40.0000 mg | ORAL_TABLET | Freq: Every day | ORAL | Status: DC

## 2014-09-23 MED ORDER — PHENTERMINE HCL 37.5 MG PO CAPS: 37.5000 mg | ORAL_CAPSULE | ORAL | Status: DC

## 2014-09-23 NOTE — Telephone Encounter (Signed)
Received fax from pharmacy for a refill for Trazodone 50mg . Per Dr. De Nurse, pt is authiorized for a refill for Trazodone 50mg , Qty 30. Prescription was sent to pharmacy. PT has f/u appt on 5/13. Pt states and shows understanding.

## 2014-09-23 NOTE — Progress Notes (Signed)
Subjective:    Patient ID: Virginia Logan, female    DOB: 07-16-1970, 44 y.o.   MRN: [id number]  HPI She's here today to discuss weight loss options. She joined the gym and has been going 5-6 days per week. She says she's been doing cardio and weights and has changed her diet. Also doing some yoga. She has been doing this 2 months. She has lost 10-12 lbs.  She has plateued.   Sinus congestion x 7 days. . She is using her flonase and inhaler.  + fever. Throat feel tight.  Noticed bumps on her tongue today. She has been having to use her albuterol  multiple times a day.    Review of Systems BP 133/75 mmHg  Pulse 103  Ht 5\' 4"  (1.626 m)  Wt 210 lb (95.255 kg)  BMI 36.03 kg/m2  PF 350 L/min    Allergies  Allergen Reactions  . Bactrim [Sulfamethoxazole-Trimethoprim] Anaphylaxis  . Ambien [Zolpidem] Other (See Comments)    Emotional Instability.  . Moxifloxacin     REACTION: Rash  . Paxil [Paroxetine Hcl] Other (See Comments)    Excess sedation     Past Medical History  Diagnosis Date  . Anemia     Past Surgical History  Procedure Laterality Date  . Left ankle      History   Social History  . Marital Status: Married    Spouse Name: N/A  . Number of Children: N/A  . Years of Education: N/A   Occupational History  . Not on file.   Social History Main Topics  . Smoking status: Former Smoker -- 0.30 packs/day    Types: Cigarettes  . Smokeless tobacco: Not on file  . Alcohol Use: No  . Drug Use: No     Comment: Caffeine: Coffee 2-3 cupsper day.   Marland Kitchen Sexual Activity:    Partners: Male     Comment: Patient denies sexual side effects.   Other Topics Concern  . Not on file   Social History Narrative    Family History  Problem Relation Age of Onset  . Diabetes Mother   . Hyperlipidemia Mother   . Heart attack Mother   . Cancer Father     prostate  . Graves' disease Father   . Depression Father   . Anxiety disorder Father   . Crohn's disease Brother   .  Dementia Maternal Aunt   . Dementia Maternal Uncle   . Dementia Paternal Grandmother   . Bipolar disorder Neg Hx   . Schizophrenia Neg Hx   . OCD Neg Hx   . Alcohol abuse Neg Hx   . Drug abuse Neg Hx     Outpatient Encounter Prescriptions as of 09/23/2014  Medication Sig  . albuterol (PROAIR HFA) 108 (90 BASE) MCG/ACT inhaler Inhale 2 puffs into the lungs every 6 (six) hours as needed for wheezing or shortness of breath.  Marland Kitchen azithromycin (ZITHROMAX) 250 MG tablet 2 tabs on Day 1, then one a day x 4 day.  . phentermine 37.5 MG capsule Take 1 capsule (37.5 mg total) by mouth every morning.  . predniSONE (DELTASONE) 20 MG tablet Take 2 tablets (40 mg total) by mouth daily.  . traZODone (DESYREL) 50 MG tablet Take one at night  . venlafaxine XR (EFFEXOR-XR) 150 MG 24 hr capsule Take 1 capsule (150 mg total) by mouth daily.  . [DISCONTINUED] amoxicillin (AMOXIL) 875 MG tablet Take 1 tablet (875 mg total) by mouth 2 (two) times daily. (Rx  void after 07/24/14)  . [DISCONTINUED] benzonatate (TESSALON) 200 MG capsule Take 1 capsule (200 mg total) by mouth at bedtime. Take as needed for cough  . [DISCONTINUED] predniSONE (DELTASONE) 20 MG tablet Take 1 tablet (20 mg total) by mouth 2 (two) times daily. Take with food.  . [DISCONTINUED] traZODone (DESYREL) 50 MG tablet Take one at night  . albuterol (PROVENTIL) (2.5 MG/3ML) 0.083% nebulizer solution 2.5 mg           Objective:   Physical Exam  Constitutional: She is oriented to person, place, and time. She appears well-developed and well-nourished.  HENT:  Head: Normocephalic and atraumatic.  Right Ear: External ear normal.  Left Ear: External ear normal.  Nose: Nose normal.  Mouth/Throat: Oropharynx is clear and moist.  TMs and canals are clear.   Eyes: Conjunctivae and EOM are normal. Pupils are equal, round, and reactive to light.  Neck: Neck supple. No thyromegaly present.  Cardiovascular: Normal rate, regular rhythm and normal heart  sounds.   Pulmonary/Chest: Effort normal and breath sounds normal. She has no wheezes.  Lymphadenopathy:    She has no cervical adenopathy.  Neurological: She is alert and oriented to person, place, and time.  Skin: Skin is warm and dry.  Psychiatric: She has a normal mood and affect.          Assessment & Plan:  Acute sinusitis/asthma exacerbation-given the nebulizer treatment here in the office. We'll treat with azithromycin and 5 days of prednisone. Strongly encouraged her to fill it.  Abnormal weight gain-she's actually done well with her exercise and dieting. But she has plateaued. We discussed options. She had been on phentermine a couple of years ago and did well with it. We will restart that today. Reminded her to stop it immediately if she expresses any chest pain, shortness of breath, or palpitations. We will need to monitor her heart rate as her pulse tends to run high. Follow up in one month for blood pressure and weight check. Next  She also wanted to have blood work done. She did see her GYN this year but he did not order any labs. She would like to have her cholesterol checked. She's also due for her mammograms we will place the order as well.

## 2014-10-08 ENCOUNTER — Ambulatory Visit (INDEPENDENT_AMBULATORY_CARE_PROVIDER_SITE_OTHER): Payer: Managed Care, Other (non HMO) | Admitting: Psychiatry

## 2014-10-08 ENCOUNTER — Encounter (HOSPITAL_COMMUNITY): Payer: Self-pay | Admitting: Psychiatry

## 2014-10-08 VITALS — Ht 64.0 in | Wt 202.0 lb

## 2014-10-08 DIAGNOSIS — F321 Major depressive disorder, single episode, moderate: Secondary | ICD-10-CM

## 2014-10-08 DIAGNOSIS — F410 Panic disorder [episodic paroxysmal anxiety] without agoraphobia: Secondary | ICD-10-CM

## 2014-10-08 DIAGNOSIS — F411 Generalized anxiety disorder: Secondary | ICD-10-CM | POA: Diagnosis not present

## 2014-10-08 DIAGNOSIS — N943 Premenstrual tension syndrome: Secondary | ICD-10-CM

## 2014-10-08 MED ORDER — FLUOXETINE HCL 10 MG PO TABS: ORAL_TABLET | ORAL | Status: DC

## 2014-10-08 MED ORDER — VENLAFAXINE HCL ER 150 MG PO CP24: 150.0000 mg | ORAL_CAPSULE | Freq: Every day | ORAL | Status: DC

## 2014-10-08 MED ORDER — TRAZODONE HCL 50 MG PO TABS: ORAL_TABLET | ORAL | Status: DC

## 2014-10-08 NOTE — Progress Notes (Signed)
Patient ID: Virginia Logan, female   DOB: Jan 03, 1971, 44 y.o.   MRN: [id number] [place] Follow-up Outpatient Visit  Virginia Logan [id number] 44 y.o.  10/08/2014  Chief Complaint: anxiety and stress follow up     History of Present Illness:   Patient returns for Medication Follow up and is diagnosed with Major depressive disorder moderate and generalized anxiety disorder. Panic disorder. Premenstrual dysphoria  Effexor has been helping her depression and anxiety. She has got a new job and she is liking it keeps her going she feels her mood is more balanced but during the menstrual cycle she feels excessive dysphoria and bloating. She is following with her OB/GYN doctor to check her hormone level. Her panic attacks also reasonably controlled with the Effexor. She feels comfortable current medication regimen and adjustment with no reported side effects.   Patient is currently divorced so that conflict is over. She is currently living with her mom and 44 year old son is living with her. Overall  family issues including sickness of her parents does affect her mood.  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  Context of depression related to marital discord. Her husband started texting a neighbour which led to conflicts and this is leading towards a divorce. Parents sickness.   Aggravating factors of marital discord. Finances. Parents sickness She quit her job she was working at [employer] at that time. Modifying factors. Family support and medications. Severity of depresion now. 7/10. 10 being happy. Anxiety 2/10. 10 being v anxious. Duration: On and off for last 15 years since Postpartum.  Has been on wellbutrin, zoloft and other meds in the past. There is no side effects from current meds.   Past Medical History  Diagnosis Date  . Anemia    Family History  Problem Relation Age of Onset  . Diabetes Mother   . Hyperlipidemia Mother   .  Heart attack Mother   . Cancer Father     prostate  . Graves' disease Father   . Depression Father   . Anxiety disorder Father   . Crohn's disease Brother   . Dementia Maternal Aunt   . Dementia Maternal Uncle   . Dementia Paternal Grandmother   . Bipolar disorder Neg Hx   . Schizophrenia Neg Hx   . OCD Neg Hx   . Alcohol abuse Neg Hx   . Drug abuse Neg Hx     Outpatient Encounter Prescriptions as of 10/08/2014  Medication Sig  . albuterol (PROAIR HFA) 108 (90 BASE) MCG/ACT inhaler Inhale 2 puffs into the lungs every 6 (six) hours as needed for wheezing or shortness of breath.  Marland Kitchen azithromycin (ZITHROMAX) 250 MG tablet 2 tabs on Day 1, then one a day x 4 day.  Marland Kitchen FLUoxetine (PROZAC) 10 MG tablet Take once a day during menstrual period for pre menstrual dysphoric disorder  . phentermine 37.5 MG capsule Take 1 capsule (37.5 mg total) by mouth every morning.  . predniSONE (DELTASONE) 20 MG tablet Take 2 tablets (40 mg total) by mouth daily.  . traZODone (DESYREL) 50 MG tablet Take one at night  . venlafaxine XR (EFFEXOR-XR) 150 MG 24 hr capsule Take 1 capsule (150 mg total) by mouth daily.  . [DISCONTINUED] traZODone (DESYREL) 50 MG tablet Take one at night  . [DISCONTINUED] venlafaxine XR (EFFEXOR-XR) 150 MG 24 hr capsule Take 1 capsule (150 mg total) by mouth daily.   No facility-administered encounter medications on file as of 10/08/2014.  No results found for this or any previous visit (from the past 2160 hour(s)).  Ht 5\' 4"  (1.626 m)  Wt 202 lb (91.627 kg)  BMI 34.66 kg/m2   Review of Systems  Constitutional: Negative.   Neurological: Negative for tremors.  Psychiatric/Behavioral: Negative for depression and suicidal ideas.    Mental Status Examination  Appearance: casual Alert: Yes Attention: fair  Cooperative: Yes Eye Contact: Fair Speech: normal tone Psychomotor Activity: Decreased Memory/Concentration: adequate Oriented: person, place, time/date and  situation Mood: euthymic Affect: Congruent Thought Processes and Associations: Coherent Fund of Knowledge: Fair Thought Content: Suicidal ideation and Homicidal ideation were denied Insight: Fair Judgement: Fair  Diagnosis: Maj. depressive disorder recurrent moderate. Improving. Generalized anxiety disorder improving panic disorder improving. Panic disorder. PMDD  Treatment Plan:   Continue Effexor for depression and anxiety and panic symptoms.  Prozac 10 mg during that period cycle for dysphoric disorder. Continue trazodone for sleep and insomnia Wants to come back in 3 months since she will start a new job.  Pertinent Labs and Relevant Prior Notes reviewed. Medication Side effects, benefits and risks reviewed/discussed with Patient. Time given for patient to respond and asks questions regarding the Diagnosis and Medications. Safety concerns and to report to ER if suicidal or call 911. Relevant Medications refilled or called in to pharmacy. Discussed weight maintenance and Sleep Hygiene. Follow up with Primary care provider in regards to Medical conditions. Recommend compliance with medications and follow up office appointments. Discussed to avail opportunity to consider or/and continue Individual therapy with Counselor. Greater than 50% of time was spend in counseling and coordination of care with the patient.  Schedule for Follow up visit in 3 months  or call in earlier as necessary.  Merian Capron, MD 10/08/2014

## 2014-11-04 ENCOUNTER — Ambulatory Visit: Payer: Self-pay

## 2015-01-09 ENCOUNTER — Other Ambulatory Visit (HOSPITAL_COMMUNITY): Payer: Self-pay | Admitting: Psychiatry

## 2015-01-11 ENCOUNTER — Ambulatory Visit (HOSPITAL_COMMUNITY): Payer: Self-pay | Admitting: Psychiatry

## 2015-01-12 ENCOUNTER — Ambulatory Visit (INDEPENDENT_AMBULATORY_CARE_PROVIDER_SITE_OTHER): Payer: BLUE CROSS/BLUE SHIELD | Admitting: Psychiatry

## 2015-01-12 ENCOUNTER — Encounter (HOSPITAL_COMMUNITY): Payer: Self-pay | Admitting: Psychiatry

## 2015-01-12 VITALS — BP 116/62 | HR 100 | Ht 64.0 in | Wt 206.8 lb

## 2015-01-12 DIAGNOSIS — F410 Panic disorder [episodic paroxysmal anxiety] without agoraphobia: Secondary | ICD-10-CM

## 2015-01-12 DIAGNOSIS — F411 Generalized anxiety disorder: Secondary | ICD-10-CM

## 2015-01-12 DIAGNOSIS — N943 Premenstrual tension syndrome: Secondary | ICD-10-CM | POA: Diagnosis not present

## 2015-01-12 DIAGNOSIS — F321 Major depressive disorder, single episode, moderate: Secondary | ICD-10-CM

## 2015-01-12 MED ORDER — VENLAFAXINE HCL ER 150 MG PO CP24: 150.0000 mg | ORAL_CAPSULE | Freq: Every day | ORAL | Status: DC

## 2015-01-12 MED ORDER — TRAZODONE HCL 50 MG PO TABS: ORAL_TABLET | ORAL | Status: DC

## 2015-01-12 NOTE — Progress Notes (Signed)
Patient ID: Virginia Logan, female   DOB: 01-13-1971, 44 y.o.   MRN: [id number] [place] Follow-up Outpatient Visit  Monnica Saltsman [id number] 44 y.o.  01/12/2015  Chief Complaint: anxiety and stress follow up     History of Present Illness:   Patient returns for Medication Follow up and is diagnosed with Major depressive disorder moderate and generalized anxiety disorder. Panic disorder. Premenstrual dysphoria  Effexor has been helping her depression and anxiety. She has got a new job at [employer] and likes it. In past she had a difficult time at Dentist office job.. she feels her mood is more balanced but during the menstrual cycle she feels excessive dysphoria and bloating. She is following with her OB/GYN doctor to check her hormone level. Her panic attacks also reasonably controlled with the Effexor.  with no reported side effects.   Patient is currently divorced so that conflict is over. She is currently living with her mom and 44 year old son is living with her. Overall  family issues including sickness of her parents does affect her mood. Sleep is variable and has to take trazadone at night.   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  Context of depression related to marital discord. Her husband started texting a neighbour which led to conflicts and this is leading towards a divorce. Parents sickness.   Aggravating factors of marital discord. Finances. Parents sickness. Dad still in hospital.  She quit her job she was working at [employer] at that time. Modifying factors. Family support and medications. Severity of depresion now. 7/10. 10 being happy. Anxiety 2/10. 10 being v anxious. Duration: On and off for last 15 years since Postpartum.  Has been on wellbutrin, zoloft and other meds in the past. There is no side effects from current meds.   Past Medical History  Diagnosis Date  . Anemia    Family History  Problem Relation  Age of Onset  . Diabetes Mother   . Hyperlipidemia Mother   . Heart attack Mother   . Cancer Father     prostate  . Graves' disease Father   . Depression Father   . Anxiety disorder Father   . Crohn's disease Brother   . Dementia Maternal Aunt   . Dementia Maternal Uncle   . Dementia Paternal Grandmother   . Bipolar disorder Neg Hx   . Schizophrenia Neg Hx   . OCD Neg Hx   . Alcohol abuse Neg Hx   . Drug abuse Neg Hx     Outpatient Encounter Prescriptions as of 01/12/2015  Medication Sig  . albuterol (PROAIR HFA) 108 (90 BASE) MCG/ACT inhaler Inhale 2 puffs into the lungs every 6 (six) hours as needed for wheezing or shortness of breath.  Marland Kitchen azithromycin (ZITHROMAX) 250 MG tablet 2 tabs on Day 1, then one a day x 4 day.  Marland Kitchen FLUoxetine (PROZAC) 10 MG tablet Take once a day during menstrual period for pre menstrual dysphoric disorder  . phentermine 37.5 MG capsule Take 1 capsule (37.5 mg total) by mouth every morning.  . predniSONE (DELTASONE) 20 MG tablet Take 2 tablets (40 mg total) by mouth daily.  . traZODone (DESYREL) 50 MG tablet Take one at night  . venlafaxine XR (EFFEXOR-XR) 150 MG 24 hr capsule Take 1 capsule (150 mg total) by mouth daily.   No facility-administered encounter medications on file as of 01/12/2015.    No results found for this or any previous visit (from the past 2160  hour(s)).  BP 116/62 mmHg  Pulse 100  Ht 5\' 4"  (1.626 m)  Wt 206 lb 12.8 oz (93.804 kg)  BMI 35.48 kg/m2  SpO2 96%   Review of Systems  Constitutional: Negative for fever.  Skin: Negative for rash.  Neurological: Negative for tremors.  Psychiatric/Behavioral: Negative for depression and suicidal ideas. The patient has insomnia.     Mental Status Examination  Appearance: casual Alert: Yes Attention: fair  Cooperative: Yes Eye Contact: Fair Speech: normal tone Psychomotor Activity: Decreased Memory/Concentration: adequate Oriented: person, place, time/date and situation Mood:  euthymic Affect: Congruent Thought Processes and Associations: Coherent Fund of Knowledge: Fair Thought Content: Suicidal ideation and Homicidal ideation were denied Insight: Fair Judgement: Fair  Diagnosis: Maj. depressive disorder recurrent moderate. Improving. Generalized anxiety disorder improving panic disorder improving. Panic disorder. PMDD  Treatment Plan:   Continue Effexor for depression and anxiety and panic symptoms. 90 day supply sent   Prozac 10 mg during that period cycle for dysphoric disorder. Will call if needed more.  Continue trazodone for sleep and insomnia. Prescription written.  Wants to come back in 3 months since she will start a new job.  Pertinent Labs and Relevant Prior Notes reviewed. Medication Side effects, benefits and risks reviewed/discussed with Patient. Time given for patient to respond and asks questions regarding the Diagnosis and Medications. Safety concerns and to report to ER if suicidal or call 911. Relevant Medications refilled or called in to pharmacy. Discussed weight maintenance and Sleep Hygiene. Follow up with Primary care provider in regards to Medical conditions. Recommend compliance with medications and follow up office appointments. Discussed to avail opportunity to consider or/and continue Individual therapy with Counselor. Greater than 50% of time was spend in counseling and coordination of care with the patient.  Schedule for Follow up visit in 3 months  or call in earlier as necessary.  Merian Capron, MD 01/12/2015

## 2015-04-08 ENCOUNTER — Ambulatory Visit: Payer: Self-pay | Admitting: Family Medicine

## 2015-04-11 ENCOUNTER — Other Ambulatory Visit (HOSPITAL_COMMUNITY): Payer: Self-pay | Admitting: Psychiatry

## 2015-04-11 NOTE — Telephone Encounter (Signed)
PT called for a refill for Trazodone 50mg . Per Dr. De Nurse, pt is authorized for a refill for Trazodone 50mg , #30. Prescriptions was sent to [facility]. Called and informed pt of prescription status. Pt has a f/u appt on 04/29/15. Pt verbalizes understanding.

## 2015-04-15 ENCOUNTER — Ambulatory Visit: Payer: Self-pay | Admitting: Family Medicine

## 2015-04-23 ENCOUNTER — Other Ambulatory Visit (HOSPITAL_COMMUNITY): Payer: Self-pay | Admitting: Psychiatry

## 2015-04-29 ENCOUNTER — Ambulatory Visit (INDEPENDENT_AMBULATORY_CARE_PROVIDER_SITE_OTHER): Payer: BLUE CROSS/BLUE SHIELD | Admitting: Psychiatry

## 2015-04-29 ENCOUNTER — Encounter (HOSPITAL_COMMUNITY): Payer: Self-pay | Admitting: Psychiatry

## 2015-04-29 VITALS — BP 124/64 | HR 86 | Ht 64.0 in | Wt 208.0 lb

## 2015-04-29 DIAGNOSIS — N943 Premenstrual tension syndrome: Secondary | ICD-10-CM

## 2015-04-29 DIAGNOSIS — F321 Major depressive disorder, single episode, moderate: Secondary | ICD-10-CM

## 2015-04-29 DIAGNOSIS — F410 Panic disorder [episodic paroxysmal anxiety] without agoraphobia: Secondary | ICD-10-CM | POA: Diagnosis not present

## 2015-04-29 DIAGNOSIS — F411 Generalized anxiety disorder: Secondary | ICD-10-CM | POA: Diagnosis not present

## 2015-04-29 MED ORDER — TRAZODONE HCL 100 MG PO TABS: 50.0000 mg | ORAL_TABLET | Freq: Every day | ORAL | Status: DC

## 2015-04-29 MED ORDER — VENLAFAXINE HCL ER 150 MG PO CP24: 150.0000 mg | ORAL_CAPSULE | Freq: Every day | ORAL | Status: DC

## 2015-04-29 NOTE — Progress Notes (Signed)
Patient ID: Arine Luks, female   DOB: August 08, 1970, 44 y.o.   MRN: [id number] [place] Follow-up Outpatient Visit  Tonimarie Calver [id number] 44 y.o.  04/29/2015  Chief Complaint: anxiety and stress follow up     History of Present Illness:   Patient returns for Medication Follow up and is diagnosed with Major depressive disorder moderate and generalized anxiety disorder. Panic disorder. Premenstrual dysphoria  Effexor has been helping her depression and anxiety. She has got a new job at [employer] and likes it. In past she had a difficult time at Dentist office job.. Pre menstrual dysphoria: prozac was started 10mg  during for that period it helps.   Patient is currently divorced so that conflict is over. She is currently living with her mom and 10 year old son is living with her. Overall  family issues including sickness of her parents does affect her mood. Sleep is variable and has to take trazadone at night. But she has to take around 100mg  .   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  Context of depression related to marital discord. Her husband started texting a neighbour which led to conflicts and this is leading towards a divorce. Parents sickness.   Aggravating factors of marital discord. Finances. Parents sickness. Dad still in hospital.  She quit her job she was working at [employer] at that time. Modifying factors. Family support and medications. Severity of depresion now. 7/10. 10 being happy. Anxiety 2/10. 10 being v anxious. Duration: On and off for last 15 years since Postpartum.  Has been on wellbutrin, zoloft and other meds in the past. There is no side effects from current meds.   Past Medical History  Diagnosis Date  . Anemia    Family History  Problem Relation Age of Onset  . Diabetes Mother   . Hyperlipidemia Mother   . Heart attack Mother   . Cancer Father     prostate  . Graves' disease Father   .  Depression Father   . Anxiety disorder Father   . Crohn's disease Brother   . Dementia Maternal Aunt   . Dementia Maternal Uncle   . Dementia Paternal Grandmother   . Bipolar disorder Neg Hx   . Schizophrenia Neg Hx   . OCD Neg Hx   . Alcohol abuse Neg Hx   . Drug abuse Neg Hx     Outpatient Encounter Prescriptions as of 04/29/2015  Medication Sig  . albuterol (PROAIR HFA) 108 (90 BASE) MCG/ACT inhaler Inhale 2 puffs into the lungs every 6 (six) hours as needed for wheezing or shortness of breath.  Marland Kitchen azithromycin (ZITHROMAX) 250 MG tablet 2 tabs on Day 1, then one a day x 4 day.  Marland Kitchen FLUoxetine (PROZAC) 10 MG tablet Take once a day during menstrual period for pre menstrual dysphoric disorder  . phentermine 37.5 MG capsule Take 1 capsule (37.5 mg total) by mouth every morning.  . traZODone (DESYREL) 100 MG tablet Take 0.5 tablets (50 mg total) by mouth at bedtime. Take one tablet or half tablet of 100mg  at night.  . venlafaxine XR (EFFEXOR-XR) 150 MG 24 hr capsule Take 1 capsule (150 mg total) by mouth daily.  . [DISCONTINUED] traZODone (DESYREL) 50 MG tablet TAKE 1 TABLET BY MOUTH AT BEDTIME  . [DISCONTINUED] venlafaxine XR (EFFEXOR-XR) 150 MG 24 hr capsule Take 1 capsule (150 mg total) by mouth daily.  . predniSONE (DELTASONE) 20 MG tablet Take 2 tablets (40 mg total) by mouth daily. (  Patient not taking: Reported on 04/29/2015)   No facility-administered encounter medications on file as of 04/29/2015.    No results found for this or any previous visit (from the past 2160 hour(s)).  BP 124/64 mmHg  Pulse 86  Ht 5\' 4"  (1.626 m)  Wt 208 lb (94.348 kg)  BMI 35.69 kg/m2  SpO2 95%   Review of Systems  Constitutional: Negative for fever.  Skin: Negative for rash.  Neurological: Negative for tingling and tremors.  Psychiatric/Behavioral: Negative for depression and suicidal ideas. The patient has insomnia.     Mental Status Examination  Appearance: casual Alert: Yes Attention:  fair  Cooperative: Yes Eye Contact: Fair Speech: normal tone Psychomotor Activity: Decreased Memory/Concentration: adequate Oriented: person, place, time/date and situation Mood: euthymic Affect: Congruent Thought Processes and Associations: Coherent Fund of Knowledge: Fair Thought Content: Suicidal ideation and Homicidal ideation were denied Insight: Fair Judgement: Fair  Diagnosis: Maj. depressive disorder recurrent moderate. Improving. Generalized anxiety disorder improving panic disorder improving. Panic disorder. PMDD  Treatment Plan:   Continue Effexor for depression and anxiety and panic symptoms. 90 day supply sent   Prozac 10 mg during that period cycle for dysphoric disorder. Will call if needed more.  Sleep: not improved, will increase  Trazodone to 100mg . Qhs.  Prescription written.  Wants to come back in 3 months since she will start a new job.  Pertinent Labs and Relevant Prior Notes reviewed. Medication Side effects, benefits and risks reviewed/discussed with Patient. Time given for patient to respond and asks questions regarding the Diagnosis and Medications. Safety concerns and to report to ER if suicidal or call 911. Relevant Medications refilled or called in to pharmacy. Discussed weight maintenance and Sleep Hygiene. Follow up with Primary care provider in regards to Medical conditions. Recommend compliance with medications and follow up office appointments. Discussed to avail opportunity to consider or/and continue Individual therapy with Counselor. Greater than 50% of time was spend in counseling and coordination of care with the patient.  Schedule for Follow up visit in 3 months  or call in earlier as necessary.   Merian Capron, MD 04/29/2015

## 2015-07-03 ENCOUNTER — Other Ambulatory Visit (HOSPITAL_COMMUNITY): Payer: Self-pay | Admitting: Psychiatry

## 2015-07-04 NOTE — Telephone Encounter (Signed)
Received medication from [facility] for Trazodone 100mg . Per Dr. De Nurse, medication request is approved for a refill Trazodone 100mg , #30. Prescription was sent to the pharmacy. Pt is schedule for a f/u appt on 07/22/15. Pt verbalizes understanding.

## 2015-07-22 ENCOUNTER — Ambulatory Visit (HOSPITAL_COMMUNITY): Payer: Self-pay | Admitting: Psychiatry

## 2015-07-28 ENCOUNTER — Ambulatory Visit (HOSPITAL_COMMUNITY): Payer: Self-pay | Admitting: Psychiatry

## 2015-07-29 ENCOUNTER — Ambulatory Visit (INDEPENDENT_AMBULATORY_CARE_PROVIDER_SITE_OTHER): Payer: BLUE CROSS/BLUE SHIELD | Admitting: Psychiatry

## 2015-07-29 ENCOUNTER — Telehealth (HOSPITAL_COMMUNITY): Payer: Self-pay | Admitting: *Deleted

## 2015-07-29 ENCOUNTER — Encounter (HOSPITAL_COMMUNITY): Payer: Self-pay | Admitting: Psychiatry

## 2015-07-29 VITALS — BP 124/70 | HR 89 | Ht 64.0 in | Wt 211.0 lb

## 2015-07-29 DIAGNOSIS — F410 Panic disorder [episodic paroxysmal anxiety] without agoraphobia: Secondary | ICD-10-CM | POA: Diagnosis not present

## 2015-07-29 DIAGNOSIS — F321 Major depressive disorder, single episode, moderate: Secondary | ICD-10-CM | POA: Diagnosis not present

## 2015-07-29 DIAGNOSIS — N943 Premenstrual tension syndrome: Secondary | ICD-10-CM | POA: Diagnosis not present

## 2015-07-29 DIAGNOSIS — F411 Generalized anxiety disorder: Secondary | ICD-10-CM | POA: Diagnosis not present

## 2015-07-29 MED ORDER — TRAZODONE HCL 100 MG PO TABS: 100.0000 mg | ORAL_TABLET | Freq: Every day | ORAL | Status: DC

## 2015-07-29 MED ORDER — VENLAFAXINE HCL ER 150 MG PO CP24: 150.0000 mg | ORAL_CAPSULE | Freq: Every day | ORAL | Status: DC

## 2015-07-29 NOTE — Progress Notes (Signed)
Patient ID: Virginia Logan, female   DOB: 04-03-1971, 45 y.o.   MRN: [id number] [place] Follow-up Outpatient Visit  Virginia Logan [id number] 45 y.o.  07/29/2015  Chief Complaint: anxiety and stress follow up     History of Present Illness:   Patient returns for Medication Follow up and is diagnosed with Major depressive disorder moderate and generalized anxiety disorder. Panic disorder. Premenstrual dysphoria Patient is currently divorced so that conflict is over. She is currently living with her mom and 69 year-old son is living with her Effexor has been helping her depression and anxiety. 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 She is even more excited   Pre menstrual dysphoria: prozac was started 10mg  during for that period it helps. She seldom takes it.  Sleep is variable and not worse.  Less overswhelmed with family issues and now happy about her new potential job.  Concern is that she would not be able to get refills in case she is not in town and so we talked about options of calling back early or make an appointment with a local provider  Context of depression related to marital discord. Her husband started texting a neighbour which led to conflicts and this is leading towards a divorce. Parents sickness.   Aggravating factors of marital discord. Finances. Parents sickness. Job stress in past. Modifying factors. Family support and medications. New job Severity of depresion now. 7/10. 10 being happy. Anxiety 2/10. 10 being v anxious. Duration: On and off for last 15 years since Postpartum.  Has been on wellbutrin, zoloft and other meds in the past. There is no side effects from current meds.   Past Medical History  Diagnosis Date  . Anemia    Family History  Problem Relation Age of Onset  . Diabetes Mother    . Hyperlipidemia Mother   . Heart attack Mother   . Cancer Father     prostate  . Graves' disease Father   . Depression Father   . Anxiety disorder Father   . Crohn's disease Brother   . Dementia Maternal Aunt   . Dementia Maternal Uncle   . Dementia Paternal Grandmother   . Bipolar disorder Neg Hx   . Schizophrenia Neg Hx   . OCD Neg Hx   . Alcohol abuse Neg Hx   . Drug abuse Neg Hx     Outpatient Encounter Prescriptions as of 07/29/2015  Medication Sig  . albuterol (PROAIR HFA) 108 (90 BASE) MCG/ACT inhaler Inhale 2 puffs into the lungs every 6 (six) hours as needed for wheezing or shortness of breath.  Marland Kitchen azithromycin (ZITHROMAX) 250 MG tablet 2 tabs on Day 1, then one a day x 4 day.  . phentermine 37.5 MG capsule Take 1 capsule (37.5 mg total) by mouth every morning.  . traZODone (DESYREL) 100 MG tablet TAKE 1/2 TO 1 TABLET AT BEDTIME  . venlafaxine XR (EFFEXOR-XR) 150 MG 24 hr capsule Take 1 capsule (150 mg total) by mouth daily.  . [DISCONTINUED] venlafaxine XR (EFFEXOR-XR) 150 MG 24 hr capsule Take 1 capsule (150 mg total) by mouth daily.  Marland Kitchen FLUoxetine (PROZAC) 10 MG tablet Take once a day during menstrual period for pre menstrual dysphoric disorder (Patient not taking: Reported on 07/29/2015)  . [DISCONTINUED] predniSONE (DELTASONE) 20 MG tablet Take 2 tablets (  40 mg total) by mouth daily. (Patient not taking: Reported on 04/29/2015)   No facility-administered encounter medications on file as of 07/29/2015.    No results found for this or any previous visit (from the past 2160 hour(s)).  BP 124/70 mmHg  Pulse 89  Ht 5\' 4"  (1.626 m)  Wt 211 lb (95.709 kg)  BMI 36.20 kg/m2  SpO2 98%   Review of Systems  Constitutional: Negative for fever.  Cardiovascular: Negative for chest pain.  Skin: Negative for rash.  Neurological: Negative for tingling and tremors.  Psychiatric/Behavioral: Negative for depression and suicidal ideas.    Mental Status Examination  Appearance:  casual Alert: Yes Attention: fair  Cooperative: Yes Eye Contact: Fair Speech: normal tone Psychomotor Activity: Decreased Memory/Concentration: adequate Oriented: person, place, time/date and situation Mood: euthymic Affect: Congruent Thought Processes and Associations: Coherent Fund of Knowledge: Fair Thought Content: Suicidal ideation and Homicidal ideation were denied Insight: Fair Judgement: Fair  Diagnosis: Maj. depressive disorder recurrent moderate. Improving. Generalized anxiety disorder improving panic disorder improving. Panic disorder. PMDD  Treatment Plan:   Continue Effexor for depression and anxiety and panic symptoms. 90 day supply sent she can call for refills or make appointment with local provider in case travelling with new job.  explained withdrawal concerns with effexor.   Prozac 10 mg during that period cycle for dysphoric disorder, seldom use it. Will call if needed more.  Sleep: variable.  Pertinent Labs and Relevant Prior Notes reviewed. Medication Side effects, benefits and risks reviewed/discussed with Patient. Time given for patient to respond and asks questions regarding the Diagnosis and Medications. Safety concerns and to report to ER if suicidal or call 911. Relevant Medications refilled or called in to pharmacy. Discussed weight maintenance and Sleep Hygiene. Follow up with Primary care provider in regards to Medical conditions. Recommend compliance with medications and follow up office appointments.  Greater than 50% of time was spend in counseling and coordination of care with the patient.  Schedule for Follow up visit in 3 months  or call in earlier as necessary. Time spent: 25 minutes   Merian Capron, MD 07/29/2015

## 2015-07-29 NOTE — Telephone Encounter (Signed)
Per Dr. De Nurse, please send prescription for Trazodone 100mg , #30 w/ 2 refills to [facility]. Called and informed pt of prescription status. Pt verbalizes understanding.

## 2015-08-08 ENCOUNTER — Other Ambulatory Visit (HOSPITAL_COMMUNITY): Payer: Self-pay | Admitting: Psychiatry

## 2015-08-09 NOTE — Telephone Encounter (Signed)
Received medication request for Trazodone 100mg . Per Dr. De Nurse, please send refill for Trazodone 100mg , #30 w/ 1 refill was sent to pharmacy. Pt is schedule for a f/u appt on 11/04/15. Called and informed pt of prescription status. Pt verbalizes understanding.

## 2015-09-06 ENCOUNTER — Other Ambulatory Visit (HOSPITAL_COMMUNITY): Payer: Self-pay | Admitting: Psychiatry

## 2015-09-06 NOTE — Telephone Encounter (Signed)
Received medication request from [facility] for Effexor. Per Dr. De Nurse, medication request is denied. Rx for Effexor 150mg , #90 was sent to pharmacy on 07/29/15. Pt is schedule for a f/u appt on 11/04/15.

## 2015-10-26 ENCOUNTER — Ambulatory Visit (INDEPENDENT_AMBULATORY_CARE_PROVIDER_SITE_OTHER): Payer: Self-pay | Admitting: Psychiatry

## 2015-10-26 ENCOUNTER — Encounter (HOSPITAL_COMMUNITY): Payer: Self-pay | Admitting: Psychiatry

## 2015-10-26 VITALS — BP 122/70 | HR 86 | Ht 64.0 in | Wt 218.0 lb

## 2015-10-26 DIAGNOSIS — F321 Major depressive disorder, single episode, moderate: Secondary | ICD-10-CM

## 2015-10-26 DIAGNOSIS — F410 Panic disorder [episodic paroxysmal anxiety] without agoraphobia: Secondary | ICD-10-CM

## 2015-10-26 DIAGNOSIS — F411 Generalized anxiety disorder: Secondary | ICD-10-CM

## 2015-10-26 DIAGNOSIS — G4700 Insomnia, unspecified: Secondary | ICD-10-CM

## 2015-10-26 MED ORDER — VENLAFAXINE HCL ER 37.5 MG PO CP24: 150.0000 mg | ORAL_CAPSULE | Freq: Every day | ORAL | Status: DC

## 2015-10-26 MED ORDER — TRAZODONE HCL 50 MG PO TABS: 25.0000 mg | ORAL_TABLET | Freq: Every day | ORAL | Status: DC

## 2015-10-26 NOTE — Progress Notes (Signed)
Patient ID: Virginia Logan, female   DOB: February 05, 1971, 45 y.o.   MRN: [id number] [facility] Follow-up Outpatient Visit  Jacklynne Burwick [id number] 45 y.o.  10/26/2015  Chief Complaint: anxiety and stress follow up     History of Present Illness:   Patient returns for Medication Follow up and is diagnosed with Major depressive disorder moderate and generalized anxiety disorder. Panic disorder. Premenstrual dysphoria Patient is currently divorced so that conflict is over. 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 She is liking her job.   Context of depression was  related to marital discord. Her husband started texting a neighbour which led to conflicts and this is leading towards a divorce. Parents sickness.    Overall on  evaluation today she feels her panic symptoms are controlled she is not depressed. Her sleep is improving she's not taking trazodone on a regular basis. She wants to cut down on her medication and discontinue them. I cautioned about the risk of not being on medication and relapse of depression or anxiety. She understands what she is wanting to cut down on the medications  Aggravating factors of marital discord. Finances. Parents sickness. Job stress in past. Modifying factors. Family support and medications. New job Severity of depresion now. 7/10. 10 being happy. Anxiety 2/10. 10 being v anxious. Duration: On and off for last 15 years since Postpartum.  Has been on wellbutrin, zoloft and other meds in the past. There is no side effects from current meds.   Past Medical History  Diagnosis Date  . Anemia    Family History  Problem Relation Age of Onset  . Diabetes Mother   . Hyperlipidemia Mother   . Heart attack  Mother   . Cancer Father     prostate  . Graves' disease Father   . Depression Father   . Anxiety disorder Father   . Crohn's disease Brother   . Dementia Maternal Aunt   . Dementia Maternal Uncle   . Dementia Paternal Grandmother   . Bipolar disorder Neg Hx   . Schizophrenia Neg Hx   . OCD Neg Hx   . Alcohol abuse Neg Hx   . Drug abuse Neg Hx     Outpatient Encounter Prescriptions as of 10/26/2015  Medication Sig  . albuterol (PROAIR HFA) 108 (90 BASE) MCG/ACT inhaler Inhale 2 puffs into the lungs every 6 (six) hours as needed for wheezing or shortness of breath.  Marland Kitchen azithromycin (ZITHROMAX) 250 MG tablet 2 tabs on Day 1, then one a day x 4 day.  . phentermine 37.5 MG capsule Take 1 capsule (37.5 mg total) by mouth every morning.  . traZODone (DESYREL) 100 MG tablet TAKE 1/2 TO 1 TABLET AT BEDTIME  . traZODone (DESYREL) 50 MG tablet Take 0.5 tablets (25 mg total) by mouth at bedtime.  Marland Kitchen venlafaxine XR (EFFEXOR-XR) 37.5 MG 24 hr capsule Take 4 capsules (150 mg total) by mouth daily. Take 3 a day for one week. Then start taking 2  A day for the next week. Then taper down to one a day .  . [DISCONTINUED] traZODone (DESYREL) 100 MG tablet Take 1 tablet (100 mg total) by mouth at bedtime.  . [DISCONTINUED] venlafaxine XR (EFFEXOR-XR) 150 MG 24  hr capsule Take 1 capsule (150 mg total) by mouth daily.  . [DISCONTINUED] FLUoxetine (PROZAC) 10 MG tablet Take once a day during menstrual period for pre menstrual dysphoric disorder (Patient not taking: Reported on 07/29/2015)   No facility-administered encounter medications on file as of 10/26/2015.    No results found for this or any previous visit (from the past 2160 hour(s)).  BP 122/70 mmHg  Pulse 86  Ht 5\' 4"  (1.626 m)  Wt 218 lb (98.884 kg)  BMI 37.40 kg/m2  SpO2 96%   Review of Systems  Constitutional: Negative for fever.  Cardiovascular: Negative for chest pain.  Skin: Negative for rash.  Neurological: Negative for tingling and  tremors.  Psychiatric/Behavioral: Negative for depression and suicidal ideas. The patient is not nervous/anxious.     Mental Status Examination  Appearance: casual Alert: Yes Attention: fair  Cooperative: Yes Eye Contact: Fair Speech: normal tone Psychomotor Activity: Decreased Memory/Concentration: adequate Oriented: person, place, time/date and situation Mood: euthymic Affect: Congruent Thought Processes and Associations: Coherent Fund of Knowledge: Fair Thought Content: Suicidal ideation and Homicidal ideation were denied Insight: Fair Judgement: Fair  Diagnosis: Maj. depressive disorder recurrent moderate. Improving. Generalized anxiety disorder improving panic disorder improving. Panic disorder. PMDD  Treatment Plan:   Depression and anxiety: balanced. She wants to cut down on effexor. Explained to cut by 37.5mg  every week. Currently she is on 150mg . Explained risks and concersn i will see her in 6 weeks by that time she will be either on 37.5 or off for one week of med.   Sleep: improved and she wants to cut down. Will lower trazadone to 25mg  qhs prn.   Pertinent Labs and Relevant Prior Notes reviewed. Medication Side effects, benefits and risks reviewed/discussed with Patient. Time given for patient to respond and asks questions regarding the Diagnosis and Medications. Safety concerns and to report to ER if suicidal or call 911. Relevant Medications refilled or called in to pharmacy. Discussed weight maintenance and Sleep Hygiene. Follow up with Primary care provider in regards to Medical conditions. Recommend compliance with medications and follow up office appointments.  Greater than 50% of time was spend in counseling and coordination of care with the patient.  Schedule for Follow up visit in 6 weeks or call in earlier as necessary. Time spent: 25 minutes   Merian Capron, MD 10/26/2015

## 2015-10-27 ENCOUNTER — Ambulatory Visit (HOSPITAL_COMMUNITY): Payer: Self-pay | Admitting: Psychiatry

## 2015-11-04 ENCOUNTER — Ambulatory Visit (HOSPITAL_COMMUNITY): Payer: Self-pay | Admitting: Psychiatry

## 2015-11-28 ENCOUNTER — Encounter: Payer: Self-pay | Admitting: Family Medicine

## 2015-11-28 ENCOUNTER — Ambulatory Visit (INDEPENDENT_AMBULATORY_CARE_PROVIDER_SITE_OTHER): Payer: Self-pay | Admitting: Family Medicine

## 2015-11-28 ENCOUNTER — Telehealth (HOSPITAL_COMMUNITY): Payer: Self-pay | Admitting: Psychiatry

## 2015-11-28 VITALS — BP 128/86 | HR 89 | Wt 219.0 lb

## 2015-11-28 DIAGNOSIS — R768 Other specified abnormal immunological findings in serum: Secondary | ICD-10-CM

## 2015-11-28 DIAGNOSIS — Z114 Encounter for screening for human immunodeficiency virus [HIV]: Secondary | ICD-10-CM

## 2015-11-28 DIAGNOSIS — L639 Alopecia areata, unspecified: Secondary | ICD-10-CM

## 2015-11-28 DIAGNOSIS — Z23 Encounter for immunization: Secondary | ICD-10-CM

## 2015-11-28 DIAGNOSIS — L659 Nonscarring hair loss, unspecified: Secondary | ICD-10-CM

## 2015-11-28 NOTE — Progress Notes (Addendum)
Subjective:    CC: Hair loss  HPI:  45 year old female smoker comes in today because when she went to see her hairdresser this morning she noticed a perfectly sized round area just slightly larger than a quarter with significant hearing loss. She denies any trauma or irritation. She does report for about the last 6 months she's been getting a rash on her scalp that is uncomfortable and itchy at times. In fact her hairdresser switch to a more natural dye product just to see if it was irritation or an allergic reaction to the dye itself. She's not currently been using any treatments for it.  Past medical history, Surgical history, Family history not pertinant except as noted below, Social history, Allergies, and medications have been entered into the medical record, reviewed, and corrections made.   Review of Systems: No fevers, chills, night sweats, weight loss, chest pain, or shortness of breath.   Objective:    General: Well Developed, well nourished, and in no acute distress.  Neuro: Alert and oriented x3, extra-ocular muscles intact, sensation grossly intact.  HEENT: Normocephalic, atraumatic  Skin: Warm and dry, no rashes. Skin on scalp with a slightly larger than quarter sized area of hair loss. There are tiny hairs broken off at the base. She does have an erythematous areas on the scalp. No significant scale.     Impression and Recommendations:   Hair loss-most consistent with alopecia areata. Will workup further and check her thyroid, ANA, RPR. Also do a CMP. Will call for results once available.Marland Kitchen KOH skin scraping performed. If all negative we'll refer to dermatology. I explained that oftentimes this will resolve on its own but may take some time. We can certainly refer to dermatology for further evaluation and treatment.  Tdap given today.    Will call for recent Pap smear and mammogram. She has these done with her GYN.

## 2015-11-28 NOTE — Patient Instructions (Signed)
Alopecia Areata Alopecia areata is a type of hair loss. If you have this condition, you may lose hair on your scalp in patches. In some cases, you may lose all the hair on your scalp (alopecia totalis) or all the hair from your face and body (alopecia universalis).  Alopecia areata is an autoimmune disease. This means your body's defense system (immune system) mistakes normal parts of your body for germs or other things that can make you sick. When you have alopecia areata, your immune system attacks your hair follicles.  Alopecia areata often starts during childhood but can occur at any age. Alopecia areata is not a danger to your health but can be stressful.  CAUSES  The cause of alopecia areata is unknown.  RISK FACTORS You may be at higher risk of alopecia areata if you:   Have a family history of alopecia.  Have a family history of another autoimmune disease, including type 1 diabetes and rheumatoid arthritis. SIGNS AND SYMPTOMS Signs of alopecia areata may include:  Loss of scalp hair in small, round patches. These may be about the size of a quarter.  Loss of all hair on your scalp.  Loss of eyebrow hair, facial hair, or the hair inside your nose (nasal hair).  Hair loss over your entire body. DIAGNOSIS  Alopecia areata may be diagnosed by:  Medical history and physical exam.  Taking a sample of hair to check under a microscope.  Taking a small piece of skin (biopsy) to examine under a microscope.  Blood tests to rule out other autoimmune diseases. TREATMENT  There is no cure for alopecia areata, but the disease often goes away over time. You will not lose the ability to regrow hair. Some medicines may help your hair regrow more quickly. These include:  Corticosteroids. These block inflammation caused by your immune system. You may get this medicine as a lotion for your skin or as an injection.  Minoxidil. This is a hair growth medicine you can use in areas of hair  loss.  Anthralin. This is a medicine for a skin inflammation called psoriasis that may also help alopecia.  Diphencyprone. This medicine is applied to your skin and may stimulate hair growth. HOME CARE INSTRUCTIONS  Use sunscreen or cover your head when outdoors.  Take medicines only as directed by your health care provider.  If you have lost your eyebrows, wear sunglasses outside to keep dust out of your eyes.  If you have lost hair inside your nose, wear a kerchief over your face or apply ointment to the inside of your nose. This keeps out dust and other irritants.  Keep all follow-up visits as directed by your health care provider. This is important. SEEK MEDICAL CARE IF:  Your symptoms change.  You have new symptoms.  You have a reaction to your medicines.  You are struggling emotionally.   This information is not intended to replace advice given to you by your health care provider. Make sure you discuss any questions you have with your health care provider.   Document Released: 12/17/2003 Document Revised: 06/04/2014 Document Reviewed: 08/03/2013 Elsevier Interactive Patient Education [employer].

## 2015-11-28 NOTE — Addendum Note (Signed)
Addended by: Teddy Spike on: 11/28/2015 04:24 PM   Modules accepted: Orders

## 2015-11-29 LAB — COMPLETE METABOLIC PANEL WITH GFR
ALBUMIN: 4.3 g/dL (ref 3.6–5.1)
ALK PHOS: 102 U/L (ref 33–115)
ALT: 18 U/L (ref 6–29)
AST: 18 U/L (ref 10–35)
BUN: 11 mg/dL (ref 7–25)
CO2: 28 mmol/L (ref 20–31)
CREATININE: 0.97 mg/dL (ref 0.50–1.10)
Calcium: 9.3 mg/dL (ref 8.6–10.2)
Chloride: 104 mmol/L (ref 98–110)
GFR, Est African American: 80 mL/min (ref >=60)
GFR, Est Non African American: 70 mL/min (ref >=60)
GLUCOSE: 101 mg/dL — AB (ref 65–99)
Potassium: 4.2 mmol/L (ref 3.5–5.3)
Sodium: 142 mmol/L (ref 135–146)
Total Bilirubin: 0.3 mg/dL (ref 0.2–1.2)
Total Protein: 6.7 g/dL (ref 6.1–8.1)

## 2015-11-29 LAB — TSH: TSH: 2.02 mIU/L

## 2015-11-29 LAB — RPR

## 2015-11-29 LAB — HIV ANTIBODY (ROUTINE TESTING W REFLEX): HIV: NONREACTIVE

## 2015-11-30 DIAGNOSIS — L659 Nonscarring hair loss, unspecified: Secondary | ICD-10-CM | POA: Insufficient documentation

## 2015-11-30 DIAGNOSIS — L639 Alopecia areata, unspecified: Secondary | ICD-10-CM | POA: Insufficient documentation

## 2015-11-30 LAB — FUNGAL STAIN

## 2015-11-30 LAB — ANTI-NUCLEAR AB-TITER (ANA TITER)

## 2015-11-30 LAB — ANA: Anti Nuclear Antibody(ANA): POSITIVE — AB

## 2015-11-30 NOTE — Telephone Encounter (Signed)
pt states she needs to up her dosage of venlafaxine, we were trying to decrease it, but it did not help so she wants to go back to how it was. also she needs refill on trazodone. Pt was last seen on 10/26/15.  LVM for pt to contact the office for an appt.

## 2015-11-30 NOTE — Addendum Note (Signed)
Addended by: Teddy Spike on: 11/30/2015 03:26 PM   Modules accepted: Orders

## 2015-12-05 ENCOUNTER — Telehealth: Payer: Self-pay | Admitting: Family Medicine

## 2015-12-05 DIAGNOSIS — L639 Alopecia areata, unspecified: Secondary | ICD-10-CM

## 2015-12-05 DIAGNOSIS — R768 Other specified abnormal immunological findings in serum: Secondary | ICD-10-CM

## 2015-12-05 NOTE — Telephone Encounter (Signed)
Referral placed.

## 2015-12-05 NOTE — Telephone Encounter (Signed)
FYI on location preference.

## 2015-12-05 NOTE — Telephone Encounter (Signed)
Pt called and states she would like referral for Rheum otology. Pt prefers [place] or [place] location. Will route.

## 2015-12-23 ENCOUNTER — Other Ambulatory Visit (HOSPITAL_COMMUNITY): Payer: Self-pay | Admitting: *Deleted

## 2015-12-23 DIAGNOSIS — F411 Generalized anxiety disorder: Secondary | ICD-10-CM

## 2015-12-23 DIAGNOSIS — F321 Major depressive disorder, single episode, moderate: Secondary | ICD-10-CM

## 2015-12-23 MED ORDER — TRAZODONE HCL 50 MG PO TABS: 25.0000 mg | ORAL_TABLET | Freq: Every day | ORAL | 0 refills | Status: DC

## 2015-12-23 MED ORDER — VENLAFAXINE HCL ER 37.5 MG PO CP24: 75.0000 mg | ORAL_CAPSULE | Freq: Every day | ORAL | 0 refills | Status: DC

## 2015-12-23 NOTE — Telephone Encounter (Signed)
Pt phone into pharmacy to request refills for Venlafaxine and Trazodone. Per Dr. De Nurse, refill request for Venlafaxine 37.5mg , #60 w/ no refills and Trazodone 50mg , #30 w/ no refills. Prescriptions was sent to [facility] [place]). Pt f/u appt on 01/31/16. Pt shows understanding.

## 2016-01-05 ENCOUNTER — Telehealth (HOSPITAL_COMMUNITY): Payer: Self-pay | Admitting: *Deleted

## 2016-01-05 ENCOUNTER — Telehealth: Payer: Self-pay | Admitting: Family Medicine

## 2016-01-05 NOTE — Telephone Encounter (Signed)
Patient called clinic stating she had a referral to a rheumatologist but missed that appointment. States she needs her information sent to a different location now due to that. Pt is OK to travel to [place] or [place]. Pt states she works out of town but will be back from 01/27/16-02/03/16 if her appointment could be scheduled then. Will route to referral coordinator for review. If new referral is needed, I will put one in.

## 2016-01-05 NOTE — Telephone Encounter (Signed)
Pt phone into office requesting a refill for Trazodone 50mg . Pt states she only received 15 tablets from [facility] on 11/29/15 and is currently out of medication. Spoke with Drue Dun at [facility]). Per pharmacy, pt has a refill on fill for Trazodone 50mg , #30. Prescription was last filled on 11/29/15. Per Dr. De Nurse, refill is authorized for Trazodone 50mg , #30 w/ 0 refills. Pharmacy will contact pt once prescription is ready pickup. Pt is schedule for a f/u appt on 01/31/16. Called and informed pt of refill status. Pt shows understanding. Nothing further is need at this time.

## 2016-01-10 ENCOUNTER — Other Ambulatory Visit (HOSPITAL_COMMUNITY): Payer: Self-pay | Admitting: Psychiatry

## 2016-01-10 DIAGNOSIS — F321 Major depressive disorder, single episode, moderate: Secondary | ICD-10-CM

## 2016-01-10 DIAGNOSIS — F411 Generalized anxiety disorder: Secondary | ICD-10-CM

## 2016-01-10 NOTE — Telephone Encounter (Signed)
Received fax from [facility]) requesting a refill for Effexor 37.5mg . Spoke w/ Di Kindle at [facility]. Per pharmacy, medication was filled on 11/29/15 with 0 refills left on file. Pharmacy didn't received rx escribed on 12/23/15. Per Dr. De Nurse, pt is authorized for a refill for Effexor 37.5mg , #60. Rx was escribed to pharmacy. Pt is schedule for a f/u appt on 01/31/16. Called and informed pt of refill status. Pt shows understanding.

## 2016-01-11 NOTE — Telephone Encounter (Signed)
Ok to place new referral 

## 2016-01-11 NOTE — Telephone Encounter (Signed)
I sent referral to [facility] they will call and schedule with patient for good appointment time. - Cf

## 2016-01-11 NOTE — Telephone Encounter (Signed)
Will route to referral coordinator

## 2016-01-31 ENCOUNTER — Ambulatory Visit (INDEPENDENT_AMBULATORY_CARE_PROVIDER_SITE_OTHER): Payer: 59 | Admitting: Psychiatry

## 2016-01-31 ENCOUNTER — Encounter (HOSPITAL_COMMUNITY): Payer: Self-pay | Admitting: Psychiatry

## 2016-01-31 DIAGNOSIS — F411 Generalized anxiety disorder: Secondary | ICD-10-CM | POA: Diagnosis not present

## 2016-01-31 DIAGNOSIS — F410 Panic disorder [episodic paroxysmal anxiety] without agoraphobia: Secondary | ICD-10-CM

## 2016-01-31 DIAGNOSIS — F321 Major depressive disorder, single episode, moderate: Secondary | ICD-10-CM | POA: Diagnosis not present

## 2016-01-31 DIAGNOSIS — G4700 Insomnia, unspecified: Secondary | ICD-10-CM

## 2016-01-31 MED ORDER — VENLAFAXINE HCL ER 37.5 MG PO CP24: 37.5000 mg | ORAL_CAPSULE | Freq: Two times a day (BID) | ORAL | 0 refills | Status: DC

## 2016-01-31 MED ORDER — TRAZODONE HCL 50 MG PO TABS: 50.0000 mg | ORAL_TABLET | Freq: Every day | ORAL | 2 refills | Status: DC

## 2016-01-31 MED ORDER — TRAZODONE HCL 50 MG PO TABS: 25.0000 mg | ORAL_TABLET | Freq: Every day | ORAL | 2 refills | Status: DC

## 2016-01-31 NOTE — Progress Notes (Signed)
Patient ID: Virginia Logan, female   DOB: [birth year], 45 y.o.   MRN: [id number] [facility] Follow-up Outpatient Visit  Fransisca Burningham [id number] 45 y.o.  01/31/2016  Chief Complaint: anxiety and stress follow up     History of Present Illness:   Patient returns for Medication Follow up and is diagnosed with Major depressive disorder moderate and generalized anxiety disorder. Panic disorder. Premenstrual dysphoria Patient is currently divorced so that conflict is over. 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   Context of depression was  related to marital discord. Her husband started texting a neighbour which led to conflicts and this is leading towards a divorce. Parents sickness.   Last visit she wanted to lower the dose and discontinue Effexor apparently she tried but it didn't work she started feeling more down than some depression and also was having a loss diagnosed with alopecia she has a positive ANA factor is being referred to a rheumatologist. So for now she wants to keep over the medication also taking trazodone every night for sleep. Says that autoimmune disorder runs in the family  Aggravating factors of marital discord. Finances. Parents sickness. Job stress in past. Modifying factors. Family support and medications. New job Severity of depresion now. 7/10. 10 being happy. Anxiety 2/10. 10 being v anxious. Duration: On and off for last 15 years since Postpartum.  Has been on wellbutrin, zoloft and other meds in the past. There is no side effects from current meds.   Past Medical History:  Diagnosis Date  . Anemia    Family History  Problem Relation Age of Onset  . Diabetes Mother   .  Hyperlipidemia Mother   . Heart attack Mother   . Cancer Father     prostate  . Graves' disease Father   . Depression Father   . Anxiety disorder Father   . Crohn's disease Brother   . Dementia Maternal Aunt   . Dementia Maternal Uncle   . Dementia Paternal Grandmother   . Bipolar disorder Neg Hx   . Schizophrenia Neg Hx   . OCD Neg Hx   . Alcohol abuse Neg Hx   . Drug abuse Neg Hx     Outpatient Encounter Prescriptions as of 01/31/2016  Medication Sig  . albuterol (PROAIR HFA) 108 (90 BASE) MCG/ACT inhaler Inhale 2 puffs into the lungs every 6 (six) hours as needed for wheezing or shortness of breath.  . traZODone (DESYREL) 50 MG tablet Take 1 tablet (50 mg total) by mouth at bedtime.  Marland Kitchen venlafaxine XR (EFFEXOR-XR) 37.5 MG 24 hr capsule Take 1 capsule (37.5 mg total) by mouth 2 (two) times daily. This is a 90 day supply  . [DISCONTINUED] traZODone (DESYREL) 50 MG tablet Take 0.5 tablets (25 mg total) by mouth at bedtime.  . [DISCONTINUED] traZODone (DESYREL) 50 MG tablet Take 0.5 tablets (25 mg total) by mouth at bedtime.  . [DISCONTINUED] venlafaxine XR (EFFEXOR-XR) 37.5 MG 24 hr capsule Take 1 capsule (37.5 mg total) by mouth 2 (two) times daily.   No facility-administered encounter medications on file as of 01/31/2016.     Recent Results (from the past 2160 hour(s))  Fungal Stain     Status:  None   Collection Time: 11/28/15  3:59 PM  Result Value Ref Range   Source: SCALP;SCALP    Fungus Smear       Comment:   FUNGAL STAIN       MICRO NUMBER:      [id number]   TEST STATUS:       FINAL   SPECIMEN SOURCE:   SCALP;SCALP   SPECIMEN QUALITY:  ADEQUATE   SMEAR:             No fungal elements seen.   RPR     Status: None   Collection Time: 11/28/15  4:25 PM  Result Value Ref Range   RPR Ser Ql NON REAC NON REAC  Antinuclear Antib (ANA)     Status: Abnormal   Collection Time: 11/28/15  4:25 PM  Result Value Ref Range   Anit Nuclear Antibody(ANA) POS (A) NEGATIVE  TSH      Status: None   Collection Time: 11/28/15  4:25 PM  Result Value Ref Range   TSH 2.02 mIU/L    Comment:   Reference Range   > or = 20 Years  0.40-4.50   Pregnancy Range First trimester  0.26-2.66 Second trimester 0.55-2.73 Third trimester  0.43-2.91     COMPLETE METABOLIC PANEL WITH GFR     Status: Abnormal   Collection Time: 11/28/15  4:25 PM  Result Value Ref Range   Sodium 142 135 - 146 mmol/L   Potassium 4.2 3.5 - 5.3 mmol/L   Chloride 104 98 - 110 mmol/L   CO2 28 20 - 31 mmol/L   Glucose, Bld 101 (H) 65 - 99 mg/dL   BUN 11 7 - 25 mg/dL   Creat 0.97 0.50 - 1.10 mg/dL   Total Bilirubin 0.3 0.2 - 1.2 mg/dL   Alkaline Phosphatase 102 33 - 115 U/L   AST 18 10 - 35 U/L   ALT 18 6 - 29 U/L   Total Protein 6.7 6.1 - 8.1 g/dL   Albumin 4.3 3.6 - 5.1 g/dL   Calcium 9.3 8.6 - 10.2 mg/dL   GFR, Est African American 80 >=60 mL/min   GFR, Est Non African American 70 >=60 mL/min  HIV antibody (with reflex)     Status: None   Collection Time: 11/28/15  4:25 PM  Result Value Ref Range   HIV 1&2 Ab, 4th Generation NONREACTIVE NONREACTIVE    Comment:   HIV-1 antigen and HIV-1/HIV-2 antibodies were not detected.  There is no laboratory evidence of HIV infection.   HIV-1/2 Antibody Diff        Not indicated. HIV-1 RNA, Qual TMA          Not indicated.     PLEASE NOTE: This information has been disclosed to you from records whose confidentiality may be protected by state law. If your state requires such protection, then the state law prohibits you from making any further disclosure of the information without the specific written consent of the person to whom it pertains, or as otherwise permitted by law. A general authorization for the release of medical or other information is NOT sufficient for this purpose.   The performance of this assay has not been clinically validated in patients less than 50 years old.   For additional information please refer  to http://education.questdiagnostics.com/faq/FAQ106.  (This link is being provided for informational/educational purposes only.)     Anti-nuclear ab-titer (ANA titer)     Status: Abnormal   Collection Time: 11/28/15  4:25 PM  Result Value Ref Range   ANA Pattern 1 HOMOGENEOUS (A)    ANA Titer 1 1:80 (H) titer    Comment:           Reference Range           < 1:40      Negative             1:40-1:80 Low Antibody level           > 1:80      Elevated Antibody level     There were no vitals taken for this visit.   Review of Systems  Constitutional: Negative for fever.  Cardiovascular: Negative for chest pain and palpitations.  Skin: Negative for rash.  Neurological: Negative for tingling and tremors.  Psychiatric/Behavioral: Negative for suicidal ideas. The patient is not nervous/anxious.     Mental Status Examination  Appearance: casual Alert: Yes Attention: fair  Cooperative: Yes Eye Contact: Fair Speech: normal tone Psychomotor Activity: Decreased Memory/Concentration: adequate Oriented: person, place, time/date and situation Mood: somewhat dysthymic but not hopeless.  Affect: Congruent Thought Processes and Associations: Coherent Fund of Knowledge: Fair Thought Content: Suicidal ideation and Homicidal ideation were denied Insight: Fair Judgement: Fair  Diagnosis: Maj. depressive disorder recurrent moderate. Improving. Generalized anxiety disorder improving panic disorder improving. Panic disorder. PMDD  Treatment Plan:   Depression and anxiety: continue effexor 75mg  . 90 day supply sent.  Sleep: continue trazadone 50mg  qhs Patient concerned of ANA factor and is seeing a rheumatologist; Pertinent Labs and Relevant Prior Notes reviewed. Medication Side effects, benefits and risks reviewed/discussed with Patient. Time given for patient to respond and asks questions regarding the Diagnosis and Medications. Safety concerns and to report to ER if suicidal or call  911. Relevant Medications refilled or called in to pharmacy. Discussed weight maintenance and Sleep Hygiene. Follow up with Primary care provider in regards to Medical conditions. Recommend compliance with medications and follow up office appointments.  Greater than 50% of time was spend in counseling and coordination of care with the patient.  Schedule for Follow up visit in 2-3 months or call in earlier as necessary. Time spent: 25 minutes   Merian Capron, MD 01/31/2016

## 2016-02-01 ENCOUNTER — Telehealth: Payer: Self-pay | Admitting: *Deleted

## 2016-02-01 DIAGNOSIS — L639 Alopecia areata, unspecified: Secondary | ICD-10-CM

## 2016-02-01 NOTE — Telephone Encounter (Signed)
Pt stated that the rheumatologist feels like she will need to be seen by a dermatologist for her alopecia  . Referral placed.Virginia Logan

## 2016-03-29 ENCOUNTER — Telehealth: Payer: Self-pay | Admitting: Family Medicine

## 2016-03-30 NOTE — Telephone Encounter (Signed)
error 

## 2016-04-10 ENCOUNTER — Ambulatory Visit (INDEPENDENT_AMBULATORY_CARE_PROVIDER_SITE_OTHER): Payer: Self-pay | Admitting: Family Medicine

## 2016-04-10 VITALS — BP 121/85 | HR 81 | Temp 98.2°F | Wt 216.0 lb

## 2016-04-10 DIAGNOSIS — R109 Unspecified abdominal pain: Secondary | ICD-10-CM

## 2016-04-10 DIAGNOSIS — N3001 Acute cystitis with hematuria: Secondary | ICD-10-CM

## 2016-04-10 LAB — POCT URINALYSIS DIPSTICK
BILIRUBIN UA: NEGATIVE
Glucose, UA: NEGATIVE
KETONES UA: NEGATIVE
Nitrite, UA: NEGATIVE
PROTEIN UA: NEGATIVE
SPEC GRAV UA: 1.015
Urobilinogen, UA: 0.2
pH, UA: 7

## 2016-04-10 MED ORDER — CEPHALEXIN 500 MG PO CAPS: 500.0000 mg | ORAL_CAPSULE | Freq: Two times a day (BID) | ORAL | 0 refills | Status: DC

## 2016-04-10 NOTE — Progress Notes (Signed)
Virginia Logan is a 45 y.o. female who presents to [address]: [place] today for urinary frequency urgency and dysuria present for about 5 days. No fevers chills nausea vomiting or diarrhea. No abdominal pain. Patient has mild back pain without radiation. Symptoms are consistent with previous episodes of urinary tract infection. She notes she urine is foul-smelling. She has not tried any treatment yet.   Past Medical History:  Diagnosis Date  . Anemia    Past Surgical History:  Procedure Laterality Date  . left ankle     Social History  Substance Use Topics  . Smoking status: Former Smoker    Packs/day: 0.30    Types: Cigarettes  . Smokeless tobacco: Never Used  . Alcohol use No   family history includes Anxiety disorder in her father; Cancer in her father; Crohn's disease in her brother; Dementia in her maternal aunt, maternal uncle, and paternal grandmother; Depression in her father; Diabetes in her mother; Berenice Primas' disease in her father; Heart attack in her mother; Hyperlipidemia in her mother.  ROS as above:  Medications: Current Outpatient Prescriptions  Medication Sig Dispense Refill  . albuterol (PROAIR HFA) 108 (90 BASE) MCG/ACT inhaler Inhale 2 puffs into the lungs every 6 (six) hours as needed for wheezing or shortness of breath. 1 Inhaler 1  . traZODone (DESYREL) 50 MG tablet Take 1 tablet (50 mg total) by mouth at bedtime. 30 tablet 2  . venlafaxine XR (EFFEXOR-XR) 37.5 MG 24 hr capsule Take 1 capsule (37.5 mg total) by mouth 2 (two) times daily. This is a 90 day supply 180 capsule 0  . cephALEXin (KEFLEX) 500 MG capsule Take 1 capsule (500 mg total) by mouth 2 (two) times daily. 14 capsule 0   No current facility-administered medications for this visit.    Allergies  Allergen Reactions  . Bactrim [Sulfamethoxazole-Trimethoprim] Anaphylaxis  . Ambien  [Zolpidem] Other (See Comments)    Emotional Instability.  . Moxifloxacin     REACTION: Rash  . Paxil [Paroxetine Hcl] Other (See Comments)    Excess sedation     Health Maintenance Health Maintenance  Topic Date Due  . PAP SMEAR  07/17/1989  . MAMMOGRAM  10/19/2011  . INFLUENZA VACCINE  12/27/2015  . TETANUS/TDAP  11/27/2025  . HIV Screening  Completed     Exam:  BP 121/85   Pulse 81   Temp 98.2 F (36.8 C) (Oral)   Wt 216 lb (98 kg)   BMI 37.08 kg/m  Gen: Well NAD HEENT: EOMI,  MMM Lungs: Normal work of breathing. CTABL Heart: RRR no MRG Abd: NABS, Soft. Nondistended, Nontender no CVA angle tenderness to percussion Exts: Brisk capillary refill, warm and well perfused.    Results for orders placed or performed in visit on 04/10/16 (from the past 72 hour(s))  POCT Urinalysis Dipstick     Status: Abnormal   Collection Time: 04/10/16  3:50 PM  Result Value Ref Range   Color, UA dark yellow    Clarity, UA cloudy    Glucose, UA negative    Bilirubin, UA negative    Ketones, UA negative    Spec Grav, UA 1.015    Blood, UA trace    pH, UA 7.0    Protein, UA negative    Urobilinogen, UA 0.2    Nitrite, UA negative    Leukocytes, UA large (3+) (A) Negative   No results found.    Assessment and Plan: 45  y.o. female with Urinary tract infection. Treat Empirically with Keflex. Return as needed.   Orders Placed This Encounter  Procedures  . POCT Urinalysis Dipstick    Discussed warning signs or symptoms. Please see discharge instructions. Patient expresses understanding.

## 2016-04-10 NOTE — Patient Instructions (Signed)
Thank you for coming in today. If your belly pain worsens, or you have high fever, bad vomiting, blood in your stool or black tarry stool go to the Emergency Room.    Urinary Tract Infection, Adult A urinary tract infection (UTI) is an infection of any part of the urinary tract, which includes the kidneys, ureters, bladder, and urethra. These organs make, store, and get rid of urine in the body. UTI can be a bladder infection (cystitis) or kidney infection (pyelonephritis). What are the causes? This infection may be caused by fungi, viruses, or bacteria. Bacteria are the most common cause of UTIs. This condition can also be caused by repeated incomplete emptying of the bladder during urination. What increases the risk? This condition is more likely to develop if:  You ignore your need to urinate or hold urine for long periods of time.  You do not empty your bladder completely during urination.  You wipe back to front after urinating or having a bowel movement, if you are female.  You are uncircumcised, if you are female.  You are constipated.  You have a urinary catheter that stays in place (indwelling).  You have a weak defense (immune) system.  You have a medical condition that affects your bowels, kidneys, or bladder.  You have diabetes.  You take antibiotic medicines frequently or for long periods of time, and the antibiotics no longer work well against certain types of infections (antibiotic resistance).  You take medicines that irritate your urinary tract.  You are exposed to chemicals that irritate your urinary tract.  You are female. What are the signs or symptoms? Symptoms of this condition include:  Fever.  Frequent urination or passing small amounts of urine frequently.  Needing to urinate urgently.  Pain or burning with urination.  Urine that smells bad or unusual.  Cloudy urine.  Pain in the lower abdomen or back.  Trouble urinating.  Blood in the  urine.  Vomiting or being less hungry than normal.  Diarrhea or abdominal pain.  Vaginal discharge, if you are female. How is this diagnosed? This condition is diagnosed with a medical history and physical exam. You will also need to provide a urine sample to test your urine. Other tests may be done, including:  Blood tests.  Sexually transmitted disease (STD) testing. If you have had more than one UTI, a cystoscopy or imaging studies may be done to determine the cause of the infections. How is this treated? Treatment for this condition often includes a combination of two or more of the following:  Antibiotic medicine.  Other medicines to treat less common causes of UTI.  Over-the-counter medicines to treat pain.  Drinking enough water to stay hydrated. Follow these instructions at home:  Take over-the-counter and prescription medicines only as told by your health care provider.  If you were prescribed an antibiotic, take it as told by your health care provider. Do not stop taking the antibiotic even if you start to feel better.  Avoid alcohol, caffeine, tea, and carbonated beverages. They can irritate your bladder.  Drink enough fluid to keep your urine clear or pale yellow.  Keep all follow-up visits as told by your health care provider. This is important.  Make sure to:  Empty your bladder often and completely. Do not hold urine for long periods of time.  Empty your bladder before and after sex.  Wipe from front to back after a bowel movement if you are female. Use each tissue one time  when you wipe. Contact a health care provider if:  You have back pain.  You have a fever.  You feel nauseous or vomit.  Your symptoms do not get better after 3 days.  Your symptoms go away and then return. Get help right away if:  You have severe back pain or lower abdominal pain.  You are vomiting and cannot keep down any medicines or water. This information is not  intended to replace advice given to you by your health care provider. Make sure you discuss any questions you have with your health care provider. Document Released: 02/21/2005 Document Revised: 10/26/2015 Document Reviewed: 04/04/2015 Elsevier Interactive Patient Education  2017 [employer].

## 2016-04-11 ENCOUNTER — Other Ambulatory Visit: Payer: Self-pay

## 2016-04-11 MED ORDER — CEPHALEXIN 500 MG PO CAPS: 500.0000 mg | ORAL_CAPSULE | Freq: Two times a day (BID) | ORAL | 0 refills | Status: DC

## 2016-04-12 LAB — URINE CULTURE

## 2016-04-24 ENCOUNTER — Ambulatory Visit (HOSPITAL_COMMUNITY): Payer: Self-pay | Admitting: Psychiatry

## 2016-04-26 ENCOUNTER — Ambulatory Visit (INDEPENDENT_AMBULATORY_CARE_PROVIDER_SITE_OTHER): Payer: 59 | Admitting: Psychiatry

## 2016-04-26 ENCOUNTER — Encounter (HOSPITAL_COMMUNITY): Payer: Self-pay | Admitting: Psychiatry

## 2016-04-26 VITALS — BP 128/68 | HR 73 | Resp 14 | Ht 64.0 in | Wt 214.0 lb

## 2016-04-26 DIAGNOSIS — F411 Generalized anxiety disorder: Secondary | ICD-10-CM

## 2016-04-26 DIAGNOSIS — Z8249 Family history of ischemic heart disease and other diseases of the circulatory system: Secondary | ICD-10-CM

## 2016-04-26 DIAGNOSIS — F321 Major depressive disorder, single episode, moderate: Secondary | ICD-10-CM | POA: Diagnosis not present

## 2016-04-26 DIAGNOSIS — Z833 Family history of diabetes mellitus: Secondary | ICD-10-CM

## 2016-04-26 DIAGNOSIS — Z8042 Family history of malignant neoplasm of prostate: Secondary | ICD-10-CM

## 2016-04-26 DIAGNOSIS — F410 Panic disorder [episodic paroxysmal anxiety] without agoraphobia: Secondary | ICD-10-CM

## 2016-04-26 DIAGNOSIS — F5102 Adjustment insomnia: Secondary | ICD-10-CM | POA: Diagnosis not present

## 2016-04-26 DIAGNOSIS — Z79899 Other long term (current) drug therapy: Secondary | ICD-10-CM

## 2016-04-26 DIAGNOSIS — Z818 Family history of other mental and behavioral disorders: Secondary | ICD-10-CM

## 2016-04-26 DIAGNOSIS — Z8349 Family history of other endocrine, nutritional and metabolic diseases: Secondary | ICD-10-CM

## 2016-04-26 MED ORDER — VENLAFAXINE HCL ER 37.5 MG PO CP24: 37.5000 mg | ORAL_CAPSULE | Freq: Two times a day (BID) | ORAL | 0 refills | Status: DC

## 2016-04-26 MED ORDER — TRAZODONE HCL 50 MG PO TABS: 50.0000 mg | ORAL_TABLET | Freq: Every day | ORAL | 2 refills | Status: DC

## 2016-04-26 NOTE — Progress Notes (Signed)
Patient ID: Virginia Logan, female   DOB: 1970/11/17, 45 y.o.   MRN: [id number] [place] Follow-up Outpatient Visit  Astryd Friel [id number] 45 y.o.  04/26/2016  Chief Complaint: follow up  History of Present Illness:   Patient returns for Medication Follow up and is diagnosed with Major depressive disorder moderate and generalized anxiety disorder. Panic disorder.   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 On effexor Anxiety: at time fluctuates. On effexor Insomnia: trazadone helps  Aggravating factor her son has been feeling down at times she may make an appointment for him Modifying factors; her job  Severity of depression: 7/10 10 being no depression Associated symptoms: insomnia at times. No psychosis  Past Medical History:  Diagnosis Date  . Anemia    Family History  Problem Relation Age of Onset  . Diabetes Mother   . Hyperlipidemia Mother   . Heart attack Mother   . Cancer Father     prostate  . Graves' disease Father   . Depression Father   . Anxiety disorder Father   . Crohn's disease Brother   . Dementia Maternal Aunt   . Dementia Maternal Uncle   . Dementia Paternal Grandmother   . Bipolar disorder Neg Hx   . Schizophrenia Neg Hx   . OCD Neg Hx   . Alcohol abuse Neg Hx   . Drug abuse Neg Hx     Outpatient Encounter Prescriptions as of 04/26/2016  Medication Sig  . albuterol (PROAIR HFA) 108 (90 BASE) MCG/ACT inhaler Inhale 2 puffs into the lungs every 6 (six) hours as needed for wheezing or shortness of breath.  . cephALEXin (KEFLEX) 500 MG capsule Take 1 capsule (500 mg total) by mouth 2 (two) times daily.  . traZODone (DESYREL) 50 MG tablet Take 1 tablet (50 mg total) by mouth at bedtime.  Marland Kitchen venlafaxine XR (EFFEXOR-XR) 37.5 MG 24 hr  capsule Take 1 capsule (37.5 mg total) by mouth 2 (two) times daily. This is a 90 day supply  . [DISCONTINUED] traZODone (DESYREL) 50 MG tablet Take 1 tablet (50 mg total) by mouth at bedtime.  . [DISCONTINUED] venlafaxine XR (EFFEXOR-XR) 37.5 MG 24 hr capsule Take 1 capsule (37.5 mg total) by mouth 2 (two) times daily. This is a 90 day supply   No facility-administered encounter medications on file as of 04/26/2016.     Recent Results (from the past 2160 hour(s))  Urine culture     Status: None   Collection Time: 04/10/16 12:00 AM  Result Value Ref Range   Organism ID, Bacteria      Multiple organisms present,each less than 10,000 CFU/mL. These organisms,commonly found on external and internal genitalia,are considered colonizers. No further testing performed.   POCT Urinalysis Dipstick     Status: Abnormal   Collection Time: 04/10/16  3:50 PM  Result Value Ref Range   Color, UA dark yellow    Clarity, UA cloudy    Glucose, UA negative    Bilirubin, UA negative    Ketones, UA negative    Spec Grav, UA 1.015    Blood, UA trace    pH, UA 7.0    Protein, UA negative    Urobilinogen, UA 0.2    Nitrite, UA negative    Leukocytes, UA large (3+) (A) Negative  BP 128/68 (BP Location: Right Arm, Patient Position: Sitting, Cuff Size: Normal)   Pulse 73   Resp 14   Ht 5\' 4"  (1.626 m)   Wt 214 lb (97.1 kg)   SpO2 96%   BMI 36.73 kg/m    Review of Systems  Constitutional: Negative for fever.  Gastrointestinal: Negative for nausea.  Skin: Negative for rash.  Neurological: Negative for tingling and tremors.  Psychiatric/Behavioral: Negative for depression and substance abuse. The patient is not nervous/anxious.     Mental Status Examination  Appearance: casual Alert: Yes Attention: fair  Cooperative: Yes Eye Contact: Fair Speech: normal tone Psychomotor Activity: Decreased Memory/Concentration: adequate Oriented: person, place, time/date and situation Mood: euthymic.   Affect: Congruent Thought Processes and Associations: Coherent Fund of Knowledge: Fair Thought Content: Suicidal ideation and Homicidal ideation were denied Insight: Fair Judgement: Fair  Diagnosis: Maj. depressive disorder recurrent moderate. Improving. Generalized anxiety disorder improving panic disorder improving. Panic disorder. PMDD  Treatment Plan:   1. Depression: continue effexor takes 2 of 37.5mg  . 90 day sent 2. Anxiety: continue effexor insomniaL continue trazadone. Reviewed sleep hygiene  Stress due to son: recommend to make appointment for him. Reviewed stress and concerns.  Provided supportive therapy/  ANA factor positive but rheumatologist says not of too concern for now will follow up with her again Recently treated for UTI Follow up 3 months or earlier if needed   Merian Capron, MD 04/26/2016

## 2016-05-31 ENCOUNTER — Other Ambulatory Visit (HOSPITAL_COMMUNITY): Payer: Self-pay | Admitting: Psychiatry

## 2016-05-31 NOTE — Telephone Encounter (Signed)
Medication refill- received fax from [facility] requesting a refill for Trazodone. Per Dr. De Nurse, refill request is denied. Trazodone Rx was escribed to pharmacy on 11/30 w/ 2 additional refills. Called and informed pt of refill status. Pt verbalizes understanding. [name] next appt is schedule on 07/18/16.

## 2016-07-18 ENCOUNTER — Encounter (HOSPITAL_COMMUNITY): Payer: Self-pay | Admitting: Psychiatry

## 2016-07-18 ENCOUNTER — Ambulatory Visit (INDEPENDENT_AMBULATORY_CARE_PROVIDER_SITE_OTHER): Payer: Self-pay | Admitting: Psychiatry

## 2016-07-18 VITALS — BP 120/70 | HR 82 | Ht 64.0 in | Wt 212.0 lb

## 2016-07-18 DIAGNOSIS — Z8249 Family history of ischemic heart disease and other diseases of the circulatory system: Secondary | ICD-10-CM

## 2016-07-18 DIAGNOSIS — Z79899 Other long term (current) drug therapy: Secondary | ICD-10-CM

## 2016-07-18 DIAGNOSIS — E785 Hyperlipidemia, unspecified: Secondary | ICD-10-CM

## 2016-07-18 DIAGNOSIS — Z8042 Family history of malignant neoplasm of prostate: Secondary | ICD-10-CM

## 2016-07-18 DIAGNOSIS — F5102 Adjustment insomnia: Secondary | ICD-10-CM

## 2016-07-18 DIAGNOSIS — F411 Generalized anxiety disorder: Secondary | ICD-10-CM

## 2016-07-18 DIAGNOSIS — F410 Panic disorder [episodic paroxysmal anxiety] without agoraphobia: Secondary | ICD-10-CM

## 2016-07-18 DIAGNOSIS — Z818 Family history of other mental and behavioral disorders: Secondary | ICD-10-CM

## 2016-07-18 DIAGNOSIS — D649 Anemia, unspecified: Secondary | ICD-10-CM

## 2016-07-18 DIAGNOSIS — Z810 Family history of intellectual disabilities: Secondary | ICD-10-CM

## 2016-07-18 DIAGNOSIS — F321 Major depressive disorder, single episode, moderate: Secondary | ICD-10-CM

## 2016-07-18 DIAGNOSIS — E119 Type 2 diabetes mellitus without complications: Secondary | ICD-10-CM

## 2016-07-18 MED ORDER — VENLAFAXINE HCL ER 37.5 MG PO CP24: 37.5000 mg | ORAL_CAPSULE | Freq: Two times a day (BID) | ORAL | 0 refills | Status: DC

## 2016-07-18 MED ORDER — TRAZODONE HCL 50 MG PO TABS: 50.0000 mg | ORAL_TABLET | Freq: Every day | ORAL | 2 refills | Status: DC

## 2016-07-18 NOTE — Progress Notes (Signed)
Patient ID: Virginia Logan, female   DOB: Oct 15, 1970, 46 y.o.   MRN: [id number] [place] Follow-up Outpatient Visit  Kiyla Potthast [id number] 46 y.o.  07/18/2016  Chief Complaint: follow up  History of Present Illness:   Patient returns for Medication Follow up and is diagnosed with Major depressive disorder moderate and generalized anxiety disorder. Panic disorder.  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  Insomnia: on trazadone. Controlled  Aggravating factor : her son. Her mom probably has dementia Modifying factors; her job  Severity of depression:8/10. 10 being no depression. Associated symptoms: insomnia at times. No psychosis  Past Medical History:  Diagnosis Date  . Anemia    Family History  Problem Relation Age of Onset  . Diabetes Mother   . Hyperlipidemia Mother   . Heart attack Mother   . Cancer Father     prostate  . Graves' disease Father   . Depression Father   . Anxiety disorder Father   . Crohn's disease Brother   . Dementia Maternal Aunt   . Dementia Maternal Uncle   . Dementia Paternal Grandmother   . Bipolar disorder Neg Hx   . Schizophrenia Neg Hx   . OCD Neg Hx   . Alcohol abuse Neg Hx   . Drug abuse Neg Hx     Outpatient Encounter Prescriptions as of 07/18/2016  Medication Sig  . albuterol (PROAIR HFA) 108 (90 BASE) MCG/ACT inhaler Inhale 2 puffs into the lungs every 6 (six) hours as needed for wheezing or shortness of breath.  . cephALEXin (KEFLEX) 500 MG capsule Take 1 capsule (500 mg total) by mouth 2 (two) times daily.  . traZODone (DESYREL) 50 MG tablet Take 1 tablet (50 mg total) by mouth at bedtime.  Marland Kitchen venlafaxine XR (EFFEXOR-XR) 37.5 MG 24 hr capsule Take 1 capsule (37.5 mg total) by mouth 2 (two) times daily. This is a 90 day  supply  . [DISCONTINUED] traZODone (DESYREL) 50 MG tablet Take 1 tablet (50 mg total) by mouth at bedtime.  . [DISCONTINUED] venlafaxine XR (EFFEXOR-XR) 37.5 MG 24 hr capsule Take 1 capsule (37.5 mg total) by mouth 2 (two) times daily. This is a 90 day supply   No facility-administered encounter medications on file as of 07/18/2016.     No results found for this or any previous visit (from the past 2160 hour(s)).  BP 120/70 (BP Location: Right Arm, Patient Position: Sitting, Cuff Size: Normal)   Pulse 82   Ht 5\' 4"  (1.626 m)   Wt 212 lb (96.2 kg)   SpO2 97%   BMI 36.39 kg/m    Review of Systems  Constitutional: Negative for fever.  Cardiovascular: Negative for chest pain.  Gastrointestinal: Negative for nausea.  Skin: Negative for rash.  Neurological: Negative for tingling and tremors.  Psychiatric/Behavioral: Negative for depression and substance abuse. The patient is not nervous/anxious.     Mental Status Examination  Appearance: casual Alert: Yes Attention: fair  Cooperative: Yes Eye Contact: Fair Speech: normal tone Psychomotor Activity: Decreased Memory/Concentration: adequate Oriented: person, place, time/date and situation Mood: euthymic Affect: Congruent Thought Processes and Associations: Coherent Fund of Knowledge: Fair Thought Content: Suicidal ideation and Homicidal ideation were denied Insight: Fair Judgement: Fair  Diagnosis: Maj. depressive disorder recurrent moderate. Improving. Generalized anxiety disorder improving panic disorder improving. Panic disorder.  PMDD  Treatment Plan:   1. Depression: baseline not worse. Continue effexor 2. Anxiety: mild continue effexor InsomniaL: not worse. Continue trazadon.e  Stress due to son and her mom. Provided supportive therapy.   FU 2-3 months or early if needed. Reviewed side effects and concerns were addressed.   Merian Capron, MD 07/18/2016

## 2016-07-20 ENCOUNTER — Telehealth: Payer: Self-pay

## 2016-07-20 ENCOUNTER — Telehealth: Payer: Self-pay | Admitting: *Deleted

## 2016-07-20 NOTE — Telephone Encounter (Signed)
Pt called and lvm asking that I return call.Virginia Logan [place]

## 2016-07-20 NOTE — Telephone Encounter (Signed)
lvm for pt informing her that I was returning her call.Virginia Logan

## 2016-07-20 NOTE — Telephone Encounter (Addendum)
Virginia Logan reports her depression and anxiety are well controlled. She was seen recently by Dr De Nurse and he refilled her medications for 3 months. She wanted to know if she could stop going to Dr De Nurse and just follow up with Dr Madilyn Fireman and have her refill the medications as needed. Please advise.   Trazodone 50 mg Venlafaxine 37.5 mg

## 2016-07-20 NOTE — Telephone Encounter (Signed)
Yes, I would be happy to take over this prescription. But I would like to still see her. Okay to refill but I would like to see her in the next month or 2.

## 2016-07-22 NOTE — Telephone Encounter (Signed)
Patient advised of recommendations.  

## 2016-07-30 ENCOUNTER — Other Ambulatory Visit (HOSPITAL_COMMUNITY): Payer: Self-pay | Admitting: Psychiatry

## 2016-07-30 DIAGNOSIS — F411 Generalized anxiety disorder: Secondary | ICD-10-CM

## 2016-07-30 DIAGNOSIS — F321 Major depressive disorder, single episode, moderate: Secondary | ICD-10-CM

## 2016-07-30 NOTE — Telephone Encounter (Signed)
Received fax from [facility] requesting a 90 day order for Effexor. Per Dr. De Nurse, refill request for Effexor is denied. A 90 day supply was sent to pharmacy on 07/18/16. [name] next apt is schedule on 10/09/16. Nothing further is needed at this time.

## 2016-09-05 ENCOUNTER — Other Ambulatory Visit (HOSPITAL_COMMUNITY): Payer: Self-pay | Admitting: *Deleted

## 2016-09-05 NOTE — Telephone Encounter (Signed)
Medication refill- received fax from [facility]) requesting a 90 day order for Trazodone. Pt was last seen on 07/18/16. Please advise.

## 2016-09-06 NOTE — Telephone Encounter (Signed)
Can send for 90 day supply.  Please explain patient of risks including overdose

## 2016-09-07 MED ORDER — TRAZODONE HCL 50 MG PO TABS: 50.0000 mg | ORAL_TABLET | Freq: Every day | ORAL | 0 refills | Status: DC

## 2016-09-07 NOTE — Addendum Note (Signed)
Addended by: Rosana Hoes T on: 09/07/2016 09:13 AM   Modules accepted: Orders

## 2016-09-07 NOTE — Telephone Encounter (Signed)
Medication refill- received fax from [facility] requesting a refill for Trazodone. Per Dr. De Nurse, refill is authorize for Trazodone 50mg , #90. Rx was sent to pharmacy. Lvm informing pt of refill status. [name] next apt is schedule on 10/09/16.

## 2016-09-18 ENCOUNTER — Ambulatory Visit (INDEPENDENT_AMBULATORY_CARE_PROVIDER_SITE_OTHER): Payer: Self-pay | Admitting: Family Medicine

## 2016-09-18 ENCOUNTER — Encounter: Payer: Self-pay | Admitting: Family Medicine

## 2016-09-18 VITALS — BP 120/74 | HR 84 | Ht 64.0 in | Wt 213.0 lb

## 2016-09-18 DIAGNOSIS — F410 Panic disorder [episodic paroxysmal anxiety] without agoraphobia: Secondary | ICD-10-CM

## 2016-09-18 DIAGNOSIS — Z1231 Encounter for screening mammogram for malignant neoplasm of breast: Secondary | ICD-10-CM

## 2016-09-18 DIAGNOSIS — R635 Abnormal weight gain: Secondary | ICD-10-CM

## 2016-09-18 DIAGNOSIS — F418 Other specified anxiety disorders: Secondary | ICD-10-CM

## 2016-09-18 DIAGNOSIS — N926 Irregular menstruation, unspecified: Secondary | ICD-10-CM

## 2016-09-18 MED ORDER — ALBUTEROL SULFATE HFA 108 (90 BASE) MCG/ACT IN AERS: 2.0000 | INHALATION_SPRAY | Freq: Four times a day (QID) | RESPIRATORY_TRACT | 1 refills | Status: DC | PRN

## 2016-09-18 NOTE — Patient Instructions (Addendum)
F/U in 6 months for mood.   Check with insurance to see if they will cover some of the newer weight loss medications.  Can check with [facility] for the seminar for weight loss surgeries.

## 2016-09-18 NOTE — Progress Notes (Signed)
Subjective:    Patient ID: Virginia Logan, female    DOB: [birth year], 46 y.o.   MRN: [id number]  HPI  46 year old female comes in today because she is struggling to lose weight. She says she's been exercising regularly and trying to eat between 1214 100 cal per day and just cannot seem to lose weight. She's been trying for about a month. She wanted to know what I thought about the "[facility].  Concerned that she's been perimenopausal for the last couple years. She went from having regular periods only having one about every 3-4 months.   Alopecia-she says her hair is starting to grow back in on the spot that she was concerned about on her scalp.   Anxiety-she's actually doing really well on Effexor. She says that seems to be the medication that works the best for her. She says since being at therapeutic level she has not had a single panic attack. She wants to know if I would be willing to take over these prescriptions for her when she is due for her next refill.  Review of Systems  BP 120/74   Pulse 84   Ht 5\' 4"  (1.626 m)   Wt 213 lb (96.6 kg)   SpO2 94%   BMI 36.56 kg/m     Allergies  Allergen Reactions  . Bactrim [Sulfamethoxazole-Trimethoprim] Anaphylaxis  . Ambien [Zolpidem] Other (See Comments)    Emotional Instability.  . Moxifloxacin     REACTION: Rash  . Paxil [Paroxetine Hcl] Other (See Comments)    Excess sedation     Past Medical History:  Diagnosis Date  . Anemia     Past Surgical History:  Procedure Laterality Date  . left ankle      Social History   Social History  . Marital status: Married    Spouse name: N/A  . Number of children: N/A  . Years of education: N/A   Occupational History  . Not on file.   Social History Main Topics  . Smoking status: Former Smoker    Packs/day: 0.30    Types: Cigarettes  . Smokeless tobacco: Never Used  . Alcohol use No  . Drug use: No     Comment: Caffeine: Coffee 2-3 cupsper day.   Marland Kitchen Sexual  activity: Yes    Partners: Male     Comment: Patient denies sexual side effects.   Other Topics Concern  . Not on file   Social History Narrative  . No narrative on file    Family History  Problem Relation Age of Onset  . Diabetes Mother   . Hyperlipidemia Mother   . Heart attack Mother   . Cancer Father     prostate  . Graves' disease Father   . Depression Father   . Anxiety disorder Father   . Crohn's disease Brother   . Dementia Maternal Aunt   . Dementia Maternal Uncle   . Dementia Paternal Grandmother   . Bipolar disorder Neg Hx   . Schizophrenia Neg Hx   . OCD Neg Hx   . Alcohol abuse Neg Hx   . Drug abuse Neg Hx     Outpatient Encounter Prescriptions as of 09/18/2016  Medication Sig  . albuterol (PROAIR HFA) 108 (90 Base) MCG/ACT inhaler Inhale 2 puffs into the lungs every 6 (six) hours as needed for wheezing or shortness of breath.  . traZODone (DESYREL) 50 MG tablet Take 1 tablet (50 mg total) by mouth at bedtime.  Marland Kitchen venlafaxine  XR (EFFEXOR-XR) 37.5 MG 24 hr capsule Take 1 capsule (37.5 mg total) by mouth 2 (two) times daily. This is a 90 day supply  . [DISCONTINUED] albuterol (PROAIR HFA) 108 (90 BASE) MCG/ACT inhaler Inhale 2 puffs into the lungs every 6 (six) hours as needed for wheezing or shortness of breath.  . [DISCONTINUED] cephALEXin (KEFLEX) 500 MG capsule Take 1 capsule (500 mg total) by mouth 2 (two) times daily.   No facility-administered encounter medications on file as of 09/18/2016.          Objective:   Physical Exam  Constitutional: She is oriented to person, place, and time. She appears well-developed and well-nourished.  HENT:  Head: Normocephalic and atraumatic.  Cardiovascular: Normal rate, regular rhythm and normal heart sounds.   Pulmonary/Chest: Effort normal and breath sounds normal.  Neurological: She is alert and oriented to person, place, and time.  Skin: Skin is warm and dry.  Psychiatric: She has a normal mood and affect.  Her behavior is normal.         Assessment & Plan:  Abnormal weight gain - assess options. Actually think that should be a great candidate for possible bariatric surgery such as gastric sleeve etc. Right now her BMI is 36.5. She does often have problems with her back. Offered to refer her for a more comprehensive bariatric program.  The literature shows that these people are the most successful. We could also consider doing a weight loss medication here in the office but I could not offer her quite a comprehensive care that they are able to offer. She is interested in a nutrition consult so we will go ahead and place referral for that. Encouraged her to check with her insurance to see if some of the newer weight loss medications are covered. Recommend that she avoid the [facility] program.    Hair loss - her hair seems to be growing back in which is good. She also reports that her stress levels are better so that's reassuring as well.  Generalized anxiety disorder with depression-doing well on a low-dose of Effexor. I'll be happy to take over this prescription for her. She will be due for a refill in May. I will just need to see her every 6 months.   Mammogram ordered.

## 2016-10-09 ENCOUNTER — Ambulatory Visit (HOSPITAL_COMMUNITY): Payer: Self-pay | Admitting: Psychiatry

## 2016-10-23 ENCOUNTER — Telehealth: Payer: Self-pay | Admitting: *Deleted

## 2016-10-23 MED ORDER — PHENTERMINE HCL 37.5 MG PO CAPS: 37.5000 mg | ORAL_CAPSULE | ORAL | 0 refills | Status: DC

## 2016-10-23 NOTE — Telephone Encounter (Signed)
Pt called and lvm stating that she had checked w/her [employer] and they will not cover the newer weight loss medications and she is not wanting to do the surgery at this time. She is asking for a prescription for Phentermine to be sent to [facility] in [place].  Will fwd to pcp for advice.Virginia Logan [place]

## 2016-10-23 NOTE — Telephone Encounter (Signed)
Pt informed.Chloeanne Poteet Lynetta  

## 2016-10-23 NOTE — Telephone Encounter (Signed)
OK , rx printed. Ok to fax.

## 2016-10-24 ENCOUNTER — Other Ambulatory Visit (HOSPITAL_COMMUNITY): Payer: Self-pay | Admitting: Psychiatry

## 2016-10-24 ENCOUNTER — Ambulatory Visit (INDEPENDENT_AMBULATORY_CARE_PROVIDER_SITE_OTHER): Payer: Self-pay

## 2016-10-24 DIAGNOSIS — F411 Generalized anxiety disorder: Secondary | ICD-10-CM

## 2016-10-24 DIAGNOSIS — Z1231 Encounter for screening mammogram for malignant neoplasm of breast: Secondary | ICD-10-CM

## 2016-10-24 DIAGNOSIS — F321 Major depressive disorder, single episode, moderate: Secondary | ICD-10-CM

## 2016-10-31 ENCOUNTER — Other Ambulatory Visit: Payer: Self-pay

## 2016-10-31 DIAGNOSIS — F321 Major depressive disorder, single episode, moderate: Secondary | ICD-10-CM

## 2016-10-31 DIAGNOSIS — F411 Generalized anxiety disorder: Secondary | ICD-10-CM

## 2016-10-31 MED ORDER — VENLAFAXINE HCL ER 37.5 MG PO CP24: 37.5000 mg | ORAL_CAPSULE | Freq: Two times a day (BID) | ORAL | 0 refills | Status: DC

## 2016-10-31 NOTE — Progress Notes (Signed)
Rx sent per Pt request. Per last OV with PCP, it was decided PCP would take over Effexor Rx.

## 2016-11-02 ENCOUNTER — Other Ambulatory Visit (HOSPITAL_COMMUNITY): Payer: Self-pay | Admitting: Psychiatry

## 2016-11-02 DIAGNOSIS — F321 Major depressive disorder, single episode, moderate: Secondary | ICD-10-CM

## 2016-11-02 DIAGNOSIS — F411 Generalized anxiety disorder: Secondary | ICD-10-CM

## 2016-11-05 NOTE — Telephone Encounter (Signed)
Received fax from [facility] requesting a refill for Effexor. Per Dr. De Nurse, refill request is denied. Patient notified office on 10/09/16, patient will continue care with PCP. PCP sent prescription on 10/31/16. Nothing further is need at this time.

## 2016-11-14 ENCOUNTER — Other Ambulatory Visit: Payer: Self-pay | Admitting: *Deleted

## 2016-11-14 MED ORDER — TRAZODONE HCL 50 MG PO TABS: 50.0000 mg | ORAL_TABLET | Freq: Every day | ORAL | 0 refills | Status: DC

## 2017-01-28 ENCOUNTER — Other Ambulatory Visit: Payer: Self-pay | Admitting: Family Medicine

## 2017-01-28 DIAGNOSIS — F321 Major depressive disorder, single episode, moderate: Secondary | ICD-10-CM

## 2017-01-28 DIAGNOSIS — F411 Generalized anxiety disorder: Secondary | ICD-10-CM

## 2017-02-04 ENCOUNTER — Other Ambulatory Visit: Payer: Self-pay | Admitting: Family Medicine

## 2017-03-09 ENCOUNTER — Other Ambulatory Visit: Payer: Self-pay | Admitting: Family Medicine

## 2017-03-21 ENCOUNTER — Ambulatory Visit: Payer: Self-pay | Admitting: Family Medicine

## 2017-03-26 ENCOUNTER — Ambulatory Visit (INDEPENDENT_AMBULATORY_CARE_PROVIDER_SITE_OTHER): Payer: 59 | Admitting: Family Medicine

## 2017-03-26 VITALS — BP 128/86 | HR 94 | Ht 64.0 in | Wt 203.0 lb

## 2017-03-26 DIAGNOSIS — Z6834 Body mass index (BMI) 34.0-34.9, adult: Secondary | ICD-10-CM

## 2017-03-26 DIAGNOSIS — F418 Other specified anxiety disorders: Secondary | ICD-10-CM | POA: Diagnosis not present

## 2017-03-26 DIAGNOSIS — Z13220 Encounter for screening for lipoid disorders: Secondary | ICD-10-CM | POA: Diagnosis not present

## 2017-03-26 DIAGNOSIS — Z79899 Other long term (current) drug therapy: Secondary | ICD-10-CM | POA: Diagnosis not present

## 2017-03-26 DIAGNOSIS — F321 Major depressive disorder, single episode, moderate: Secondary | ICD-10-CM | POA: Diagnosis not present

## 2017-03-26 DIAGNOSIS — L659 Nonscarring hair loss, unspecified: Secondary | ICD-10-CM

## 2017-03-26 DIAGNOSIS — Z23 Encounter for immunization: Secondary | ICD-10-CM | POA: Diagnosis not present

## 2017-03-26 MED ORDER — ALBUTEROL SULFATE HFA 108 (90 BASE) MCG/ACT IN AERS: 2.0000 | INHALATION_SPRAY | Freq: Four times a day (QID) | RESPIRATORY_TRACT | 2 refills | Status: DC | PRN

## 2017-03-26 MED ORDER — PHENTERMINE HCL 37.5 MG PO CAPS: 37.5000 mg | ORAL_CAPSULE | ORAL | 1 refills | Status: DC

## 2017-03-26 MED ORDER — TRAZODONE HCL 50 MG PO TABS: 50.0000 mg | ORAL_TABLET | Freq: Every evening | ORAL | 4 refills | Status: DC | PRN

## 2017-03-26 MED ORDER — VENLAFAXINE HCL ER 37.5 MG PO CP24: 37.5000 mg | ORAL_CAPSULE | Freq: Two times a day (BID) | ORAL | 3 refills | Status: DC

## 2017-03-26 NOTE — Progress Notes (Signed)
Subjective:    Patient ID: Virginia Logan, female    DOB: Aug 06, 1970, 46 y.o.   MRN: [id number]  HPI Follow-up depression/anxiety-currently on Effexor.  She is doing well overall.  She recently got a job for [employer] and will be able to work from home until January at which point she will be moving to [place].  She states this is a great opportunity and is very excited.  Abnormal weight gain/BMI 34-she has been taking the phentermine intermittently.  She says she cannot take it consistently or she does not feel well.  But she has been able to lose 10 pounds since I last saw her.  She is really been trying hard to make healthier food choices and get more active.  In general she has been doing a lot more self care.  She has noticed more hair loss.  She is a little bit of general hair loss but also still has some difficulty with her alopecia.  She is not currently taking a multivitamin.  And she never went to have injections in the areas of alopecia.  She would like to have her ANA checked again.  Review of Systems   BP 128/86   Pulse 94   Ht 5\' 4"  (1.626 m)   Wt 203 lb (92.1 kg)   SpO2 98%   BMI 34.84 kg/m     Allergies  Allergen Reactions  . Bactrim [Sulfamethoxazole-Trimethoprim] Anaphylaxis  . Ambien [Zolpidem] Other (See Comments)    Emotional Instability.  . Moxifloxacin     REACTION: Rash  . Paxil [Paroxetine Hcl] Other (See Comments)    Excess sedation     Past Medical History:  Diagnosis Date  . Anemia     Past Surgical History:  Procedure Laterality Date  . left ankle      Social History   Social History  . Marital status: Married    Spouse name: N/A  . Number of children: N/A  . Years of education: N/A   Occupational History  . Not on file.   Social History Main Topics  . Smoking status: Former Smoker    Packs/day: 0.30    Types: Cigarettes  . Smokeless tobacco: Never Used  . Alcohol use No  . Drug use: No     Comment: Caffeine: Coffee 2-3 cupsper  day.   Marland Kitchen Sexual activity: Yes    Partners: Male     Comment: Patient denies sexual side effects.   Other Topics Concern  . Not on file   Social History Narrative  . No narrative on file    Family History  Problem Relation Age of Onset  . Diabetes Mother   . Hyperlipidemia Mother   . Heart attack Mother   . Cancer Father        prostate  . Graves' disease Father   . Depression Father   . Anxiety disorder Father   . Crohn's disease Brother   . Dementia Maternal Aunt   . Dementia Maternal Uncle   . Dementia Paternal Grandmother   . Bipolar disorder Neg Hx   . Schizophrenia Neg Hx   . OCD Neg Hx   . Alcohol abuse Neg Hx   . Drug abuse Neg Hx     Outpatient Encounter Prescriptions as of 03/26/2017  Medication Sig  . albuterol (PROAIR HFA) 108 (90 Base) MCG/ACT inhaler Inhale 2 puffs into the lungs every 6 (six) hours as needed for wheezing or shortness of breath.  . traZODone (DESYREL) 50 MG  tablet Take 1 tablet (50 mg total) by mouth at bedtime as needed for sleep. Due for follow up visit  . venlafaxine XR (EFFEXOR-XR) 37.5 MG 24 hr capsule Take 1 capsule (37.5 mg total) by mouth 2 (two) times daily. This is a 90 day supply  . [DISCONTINUED] albuterol (PROAIR HFA) 108 (90 Base) MCG/ACT inhaler Inhale 2 puffs into the lungs every 6 (six) hours as needed for wheezing or shortness of breath.  . [DISCONTINUED] traZODone (DESYREL) 50 MG tablet Take 1 tablet (50 mg total) by mouth at bedtime as needed for sleep. Due for follow up visit  . [DISCONTINUED] venlafaxine XR (EFFEXOR-XR) 37.5 MG 24 hr capsule TAKE 1 CAPSULE (37.5 MG TOTAL) BY MOUTH 2 (TWO) TIMES DAILY. THIS IS A 90 DAY SUPPLY  . phentermine 37.5 MG capsule Take 1 capsule (37.5 mg total) by mouth every morning.  . [DISCONTINUED] phentermine 37.5 MG capsule Take 1 capsule (37.5 mg total) by mouth every morning.   No facility-administered encounter medications on file as of 03/26/2017.          Objective:   Physical  Exam  Constitutional: She is oriented to person, place, and time. She appears well-developed and well-nourished.  HENT:  Head: Normocephalic and atraumatic.  Cardiovascular: Normal rate, regular rhythm and normal heart sounds.   Pulmonary/Chest: Effort normal and breath sounds normal.  Neurological: She is alert and oriented to person, place, and time.  Skin: Skin is warm and dry.  Psychiatric: She has a normal mood and affect. Her behavior is normal.        Assessment & Plan:  Depression/anxiety-overall she is doing fantastic.  PHQ 9 score is 0 gad 7 score is 2.  Refills provided.  Did encourage her to establish with a PCP as soon as she moves.  BMI 34 -we will refill her phentermine.  She will be moving to [place] in January so she can try to follow-up with new PCP at that time if not then she can always try to make an appointment right before she leaves.  Hair loss/ alopecia -for now recommend that she start a women's One-A-Day and certainly if the areas of alopecia get larger she may want to seriously consider steroid injection.  Vaccine given today.  Due for screening lipid panel.  Labs ordered today.

## 2017-03-27 LAB — COMPLETE METABOLIC PANEL WITH GFR
AG RATIO: 1.5 (calc) (ref 1.0–2.5)
ALKALINE PHOSPHATASE (APISO): 108 U/L (ref 33–115)
ALT: 24 U/L (ref 6–29)
AST: 25 U/L (ref 10–35)
Albumin: 4.5 g/dL (ref 3.6–5.1)
BUN: 15 mg/dL (ref 7–25)
CALCIUM: 9.8 mg/dL (ref 8.6–10.2)
CHLORIDE: 104 mmol/L (ref 98–110)
CO2: 27 mmol/L (ref 20–32)
Creat: 0.88 mg/dL (ref 0.50–1.10)
GFR, EST NON AFRICAN AMERICAN: 78 mL/min/{1.73_m2} (ref >=60)
GFR, Est African American: 90 mL/min/{1.73_m2} (ref >=60)
Globulin: 3 g/dL (calc) (ref 1.9–3.7)
Glucose, Bld: 90 mg/dL (ref 65–99)
POTASSIUM: 4.4 mmol/L (ref 3.5–5.3)
SODIUM: 140 mmol/L (ref 135–146)
Total Bilirubin: 0.4 mg/dL (ref 0.2–1.2)
Total Protein: 7.5 g/dL (ref 6.1–8.1)

## 2017-03-27 LAB — TSH: TSH: 1.85 mIU/L

## 2017-03-27 LAB — LIPID PANEL W/REFLEX DIRECT LDL
CHOLESTEROL: 233 mg/dL — AB (ref <200)
HDL: 55 mg/dL (ref >50)
LDL Cholesterol (Calc): 138 mg/dL (calc) — ABNORMAL HIGH
NON-HDL CHOLESTEROL (CALC): 178 mg/dL — AB (ref <130)
TRIGLYCERIDES: 247 mg/dL — AB (ref <150)
Total CHOL/HDL Ratio: 4.2 (calc) (ref <5.0)

## 2017-03-27 LAB — CBC
HEMATOCRIT: 46.9 % — AB (ref 35.0–45.0)
HEMOGLOBIN: 16.3 g/dL — AB (ref 11.7–15.5)
MCH: 30.3 pg (ref 27.0–33.0)
MCHC: 34.8 g/dL (ref 32.0–36.0)
MCV: 87.2 fL (ref 80.0–100.0)
MPV: 11 fL (ref 7.5–12.5)
Platelets: 237 10*3/uL (ref 140–400)
RBC: 5.38 10*6/uL — AB (ref 3.80–5.10)
RDW: 12.7 % (ref 11.0–15.0)
WBC: 6.9 10*3/uL (ref 3.8–10.8)

## 2017-03-27 LAB — ANA: ANA: NEGATIVE

## 2017-08-23 ENCOUNTER — Encounter: Payer: Self-pay | Admitting: Family Medicine

## 2017-08-23 ENCOUNTER — Ambulatory Visit (INDEPENDENT_AMBULATORY_CARE_PROVIDER_SITE_OTHER): Payer: BLUE CROSS/BLUE SHIELD | Admitting: Family Medicine

## 2017-08-23 VITALS — BP 139/73 | HR 82 | Temp 98.4°F | Wt 207.6 lb

## 2017-08-23 DIAGNOSIS — R232 Flushing: Secondary | ICD-10-CM

## 2017-08-23 DIAGNOSIS — J4520 Mild intermittent asthma, uncomplicated: Secondary | ICD-10-CM

## 2017-08-23 DIAGNOSIS — J301 Allergic rhinitis due to pollen: Secondary | ICD-10-CM

## 2017-08-23 DIAGNOSIS — F418 Other specified anxiety disorders: Secondary | ICD-10-CM

## 2017-08-23 DIAGNOSIS — F321 Major depressive disorder, single episode, moderate: Secondary | ICD-10-CM | POA: Diagnosis not present

## 2017-08-23 DIAGNOSIS — G4700 Insomnia, unspecified: Secondary | ICD-10-CM

## 2017-08-23 MED ORDER — TRAZODONE HCL 50 MG PO TABS: 50.0000 mg | ORAL_TABLET | Freq: Every evening | ORAL | 4 refills | Status: DC | PRN

## 2017-08-23 MED ORDER — VENLAFAXINE HCL ER 150 MG PO CP24: 150.0000 mg | ORAL_CAPSULE | Freq: Every day | ORAL | 3 refills | Status: DC

## 2017-08-23 MED ORDER — ALBUTEROL SULFATE HFA 108 (90 BASE) MCG/ACT IN AERS: 2.0000 | INHALATION_SPRAY | Freq: Four times a day (QID) | RESPIRATORY_TRACT | 2 refills | Status: DC | PRN

## 2017-08-23 MED ORDER — MOMETASONE FUROATE 50 MCG/ACT NA SUSP: 1.0000 | Freq: Every day | NASAL | 12 refills | Status: DC

## 2017-08-23 NOTE — Progress Notes (Signed)
Subjective:    CC: 6 month f/U   HPI:  Virginia Logan is here today for follow-up.  She is actually moving to [place] on Monday.  47 year old female is here today to follow-up on a couple of issues.  When I last saw her she was getting ready to move to [place].  Follow-up depression/anxiety-ports that she is doing really well.  She says in fact she is doing the best she has in years.    Hot flashes - she is currently on Effexor 37.5 mg, 2 tabs at bedtime.  Follow-up insomnia-currently using trazodone for sleep.  Follow-up asthma-doing well overall but would like a refill on her albuterol for the spring.  She does have a lot of green nasal mucus and congestion but has been using her Flonase and that seems to be working well to keep things at [place] as well as her Nettie pot.  Past medical history, Surgical history, Family history not pertinant except as noted below, Social history, Allergies, and medications have been entered into the medical record, reviewed, and corrections made.   Review of Systems: No fevers, chills, night sweats, weight loss, chest pain, or shortness of breath.   Objective:    General: Well Developed, well nourished, and in no acute distress.  Neuro: Alert and oriented x3, extra-ocular muscles intact, sensation grossly intact.  HEENT: Normocephalic, atraumatic  Skin: Warm and dry, no rashes. Cardiac: Regular rate and rhythm, no murmurs rubs or gallops, no lower extremity edema.  Respiratory: Clear to auscultation bilaterally. Not using accessory muscles, speaking in full sentences.   Impression and Recommendations:    History of Depression/anxiety-well-controlled. PHQ 9 score of 0.  She is doing fantastic.  Right now really taking Effexor mostly for hot flashes.  Insomnia -fill trazodone.  Asthma-refill albuterol.  Use as needed.  If using frequently then please let us know.  Allergic rhinitis-continue Flonase.  Sent new prescription to the pharmacy to see if it is  cheaper than the over-the-counter product.  If she feels like she is getting worse and starting more to a sinus infection and please let us know especially if she is noticing some green nasal discharge.  Hot flashes - will try increasing the Effexor 250 mg.  Though I suspect some of this may be related to increased stress levels she is moving on Monday.Marland Kitchen

## 2017-08-23 NOTE — Patient Instructions (Signed)
Emmit Alexanders in [place]!!!!

## 2017-10-16 ENCOUNTER — Encounter: Payer: Self-pay | Admitting: Family Medicine

## 2017-10-17 MED ORDER — VENLAFAXINE HCL ER 37.5 MG PO CP24: 37.5000 mg | ORAL_CAPSULE | Freq: Two times a day (BID) | ORAL | 0 refills | Status: DC

## 2017-10-17 NOTE — Telephone Encounter (Signed)
Called and confirmed with pt that she takes 37.5mg  capsules, 1 BID. RX sent to [facility] in [place] and pt aware.

## 2018-06-16 ENCOUNTER — Encounter: Payer: Self-pay | Admitting: Family Medicine

## 2018-06-16 ENCOUNTER — Ambulatory Visit (INDEPENDENT_AMBULATORY_CARE_PROVIDER_SITE_OTHER): Payer: BLUE CROSS/BLUE SHIELD | Admitting: Family Medicine

## 2018-06-16 VITALS — BP 128/72 | HR 89 | Ht 64.0 in | Wt 211.0 lb

## 2018-06-16 DIAGNOSIS — F418 Other specified anxiety disorders: Secondary | ICD-10-CM

## 2018-06-16 DIAGNOSIS — R635 Abnormal weight gain: Secondary | ICD-10-CM

## 2018-06-16 DIAGNOSIS — Z13220 Encounter for screening for lipoid disorders: Secondary | ICD-10-CM | POA: Diagnosis not present

## 2018-06-16 DIAGNOSIS — L639 Alopecia areata, unspecified: Secondary | ICD-10-CM

## 2018-06-16 DIAGNOSIS — L659 Nonscarring hair loss, unspecified: Secondary | ICD-10-CM

## 2018-06-16 MED ORDER — VENLAFAXINE HCL ER 150 MG PO CP24: 150.0000 mg | ORAL_CAPSULE | Freq: Every day | ORAL | 0 refills | Status: DC

## 2018-06-16 NOTE — Assessment & Plan Note (Signed)
She is continued to have some persistent hair loss.

## 2018-06-16 NOTE — Progress Notes (Signed)
Acute Office Visit  Subjective:    Patient ID: Virginia Logan, female    DOB: May 20, 1971, 48 y.o.   MRN: [id number]  Chief Complaint  Patient presents with  . mood    HPI Patient is in today for Depression and Anxiety -she is here today because she is really struggling.  Last time I saw her she had actually moved to [place] and gotten a job with [employer].  She is still working for them but has been able to qualify for working remotely and is actually moving back to this area in about a week to help take care of her mother who was diagnosed with stage IV lung cancer right before Thanksgiving.  She evidently has severe diabetes and peripheral vascular disease and now renal failure as well and has been placed on hospice.  Right now she is at a skilled nursing facility.  She just feels like her stress levels are quite high and she is been traveling a lot for work.  Traveling tends to stress her out anyway.  She is had more frequent panic attacks.  She feels like the Effexor is not really helping very much.  She was on 37.5 mg but says she did increase it to 75 for a few weeks and really did not notice a big difference.  She otherwise has really liked this medication though and has tolerated it well without any significant side effects.  She said she was on sertraline years ago after her son was born for postpartum depression and did well on that medication as well except for some sexual side effects.  She is not currently in a relationship.  He also wanted to let me know that she had recently started some hormone replacement therapy back in July in [place] with testosterone pellet implants.  They had not done any estrogen or progesterone.  She was told that her hemoglobin levels were elevated.  He would really like to work with a nutritionist to try to help her find a diet that reduces inflammation in her body.  She is concerned because of the diagnosis of alopecia areata that she may have some issues going on  with her immune system that are just making her feel tired all the time.  She is also been breaking out with more bumps on her face she thinks is the hair follicles being inflamed.  Past Medical History:  Diagnosis Date  . Anemia     Past Surgical History:  Procedure Laterality Date  . left ankle      Family History  Problem Relation Age of Onset  . Diabetes Mother   . Hyperlipidemia Mother   . Heart attack Mother   . Cancer Father        prostate  . Graves' disease Father   . Depression Father   . Anxiety disorder Father   . Crohn's disease Brother   . Dementia Maternal Aunt   . Dementia Maternal Uncle   . Dementia Paternal Grandmother   . Bipolar disorder Neg Hx   . Schizophrenia Neg Hx   . OCD Neg Hx   . Alcohol abuse Neg Hx   . Drug abuse Neg Hx     Social History   Socioeconomic History  . Marital status: Married    Spouse name: Not on file  . Number of children: Not on file  . Years of education: Not on file  . Highest education level: Not on file  Occupational History  . Not  on file  Social Needs  . Financial resource strain: Not on file  . Food insecurity:    Worry: Not on file    Inability: Not on file  . Transportation needs:    Medical: Not on file    Non-medical: Not on file  Tobacco Use  . Smoking status: Former Smoker    Packs/day: 0.30    Types: Cigarettes  . Smokeless tobacco: Never Used  Substance and Sexual Activity  . Alcohol use: No  . Drug use: No    Comment: Caffeine: Coffee 2-3 cupsper day.   Marland Kitchen Sexual activity: Yes    Partners: Male    Comment: Patient denies sexual side effects.  Lifestyle  . Physical activity:    Days per week: Not on file    Minutes per session: Not on file  . Stress: Not on file  Relationships  . Social connections:    Talks on phone: Not on file    Gets together: Not on file    Attends religious service: Not on file    Active member of club or organization: Not on file    Attends meetings of clubs  or organizations: Not on file    Relationship status: Not on file  . Intimate partner violence:    Fear of current or ex partner: Not on file    Emotionally abused: Not on file    Physically abused: Not on file    Forced sexual activity: Not on file  Other Topics Concern  . Not on file  Social History Narrative  . Not on file    Outpatient Medications Prior to Visit  Medication Sig Dispense Refill  . albuterol (PROAIR HFA) 108 (90 Base) MCG/ACT inhaler Inhale 2 puffs into the lungs every 6 (six) hours as needed for wheezing or shortness of breath. 1 Inhaler 2  . mometasone (NASONEX) 50 MCG/ACT nasal spray Place 1 spray into the nose daily. 17 g 12  . traZODone (DESYREL) 50 MG tablet Take 1 tablet (50 mg total) by mouth at bedtime as needed for sleep. Due for follow up visit 90 tablet 4  . venlafaxine XR (EFFEXOR XR) 37.5 MG 24 hr capsule Take 1 capsule (37.5 mg total) by mouth 2 (two) times daily. 180 capsule 0   No facility-administered medications prior to visit.     Allergies  Allergen Reactions  . Bactrim [Sulfamethoxazole-Trimethoprim] Anaphylaxis  . Ambien [Zolpidem] Other (See Comments)    Emotional Instability.  . Moxifloxacin     REACTION: Rash  . Paxil [Paroxetine Hcl] Other (See Comments)    Excess sedation     ROS     Objective:    Physical Exam  BP 128/72   Pulse 89   Ht 5\' 4"  (1.626 m)   Wt 211 lb (95.7 kg)   SpO2 98%   BMI 36.22 kg/m  Wt Readings from Last 3 Encounters:  06/16/18 211 lb (95.7 kg)  08/23/17 207 lb 9.6 oz (94.2 kg)  03/26/17 203 lb (92.1 kg)    Health Maintenance Due  Topic Date Due  . PAP SMEAR-Modifier  07/17/1989  . MAMMOGRAM  10/24/2017    There are no preventive care reminders to display for this patient.   Lab Results  Component Value Date   TSH 1.85 03/26/2017   Lab Results  Component Value Date   WBC 6.9 03/26/2017   HGB 16.3 (H) 03/26/2017   HCT 46.9 (H) 03/26/2017   MCV 87.2 03/26/2017   PLT 237  03/26/2017   Lab Results  Component Value Date   NA 140 03/26/2017   K 4.4 03/26/2017   CO2 27 03/26/2017   GLUCOSE 90 03/26/2017   BUN 15 03/26/2017   CREATININE 0.88 03/26/2017   BILITOT 0.4 03/26/2017   ALKPHOS 102 11/28/2015   AST 25 03/26/2017   ALT 24 03/26/2017   PROT 7.5 03/26/2017   ALBUMIN 4.3 11/28/2015   CALCIUM 9.8 03/26/2017   Lab Results  Component Value Date   CHOL 233 (H) 03/26/2017   Lab Results  Component Value Date   HDL 55 03/26/2017   Lab Results  Component Value Date   LDLCALC 138 (H) 03/26/2017   Lab Results  Component Value Date   TRIG 247 (H) 03/26/2017   Lab Results  Component Value Date   CHOLHDL 4.2 03/26/2017   Lab Results  Component Value Date   HGBA1C 5.5 10/19/2010       Assessment & Plan:   Problem List Items Addressed This Visit      Musculoskeletal and Integument   Alopecia areata    She is continued to have some persistent hair loss.        Other   Hair loss   Relevant Orders   Amb ref to Medical Nutrition Therapy-MNT   Depression with anxiety - Primary    PHQ- 9 score of 12 and GAD - 7 score of 16.  Discussed options.  We will try increasing Effexor to 150 mg before we consider changing medications.  The sertraline may be a good second option.  She had just started some counseling in [place] so we will try to work on maybe getting her scheduled with someone here.  She said she really like to consult with psychiatry first to make sure that she has no other underlying mood disorders, and I think that is actually quite reasonable to make sure we are dealing with the correct diagnoses.      Relevant Medications   venlafaxine XR (EFFEXOR-XR) 150 MG 24 hr capsule   Other Relevant Orders   Ambulatory referral to Psychiatry    Other Visit Diagnoses    Screening for lipoid disorders       Relevant Orders   COMPLETE METABOLIC PANEL WITH GFR   Abnormal weight gain       Relevant Orders   COMPLETE METABOLIC PANEL WITH  GFR   Lipid panel   TSH   Estradiol   Progesterone   FSH/LH       Meds ordered this encounter  Medications  . venlafaxine XR (EFFEXOR-XR) 150 MG 24 hr capsule    Sig: Take 1 capsule (150 mg total) by mouth daily with breakfast.    Dispense:  90 capsule    Refill:  0     Beatrice Lecher, MD

## 2018-06-16 NOTE — Assessment & Plan Note (Addendum)
PHQ- 9 score of 12 and GAD - 7 score of 16.  Discussed options.  We will try increasing Effexor to 150 mg before we consider changing medications.  The sertraline may be a good second option.  She had just started some counseling in [place] so we will try to work on maybe getting her scheduled with someone here.  She said she really like to consult with psychiatry first to make sure that she has no other underlying mood disorders, and I think that is actually quite reasonable to make sure we are dealing with the correct diagnoses.

## 2018-06-17 LAB — COMPLETE METABOLIC PANEL WITH GFR
AG Ratio: 1.5 (calc) (ref 1.0–2.5)
ALBUMIN MSPROF: 4.4 g/dL (ref 3.6–5.1)
ALKALINE PHOSPHATASE (APISO): 99 U/L (ref 33–115)
ALT: 23 U/L (ref 6–29)
AST: 22 U/L (ref 10–35)
BUN: 12 mg/dL (ref 7–25)
CO2: 25 mmol/L (ref 20–32)
CREATININE: 0.92 mg/dL (ref 0.50–1.10)
Calcium: 9.8 mg/dL (ref 8.6–10.2)
Chloride: 105 mmol/L (ref 98–110)
GFR, EST AFRICAN AMERICAN: 85 mL/min/{1.73_m2} (ref >=60)
GFR, Est Non African American: 73 mL/min/{1.73_m2} (ref >=60)
GLUCOSE: 105 mg/dL — AB (ref 65–99)
Globulin: 2.9 g/dL (calc) (ref 1.9–3.7)
Potassium: 4.6 mmol/L (ref 3.5–5.3)
Sodium: 139 mmol/L (ref 135–146)
TOTAL PROTEIN: 7.3 g/dL (ref 6.1–8.1)
Total Bilirubin: 0.3 mg/dL (ref 0.2–1.2)

## 2018-06-17 LAB — TSH: TSH: 1.7 mIU/L

## 2018-06-17 LAB — LIPID PANEL
CHOL/HDL RATIO: 5 (calc) — AB (ref <5.0)
CHOLESTEROL: 242 mg/dL — AB (ref <200)
HDL: 48 mg/dL — ABNORMAL LOW (ref >50)
LDL CHOLESTEROL (CALC): 147 mg/dL — AB
Non-HDL Cholesterol (Calc): 194 mg/dL (calc) — ABNORMAL HIGH (ref <130)
TRIGLYCERIDES: 307 mg/dL — AB (ref <150)

## 2018-06-17 LAB — FSH/LH
FSH: 51.2 m[IU]/mL
LH: 35.5 m[IU]/mL

## 2018-06-17 LAB — ESTRADIOL

## 2018-06-17 LAB — PROGESTERONE

## 2018-06-25 ENCOUNTER — Encounter: Payer: Self-pay | Admitting: Family Medicine

## 2018-06-25 MED ORDER — CLONAZEPAM 0.5 MG PO TABS: 0.5000 mg | ORAL_TABLET | Freq: Two times a day (BID) | ORAL | 0 refills | Status: DC | PRN

## 2018-06-25 NOTE — Telephone Encounter (Signed)
FYI

## 2018-06-26 MED ORDER — TRAZODONE HCL 100 MG PO TABS: 100.0000 mg | ORAL_TABLET | Freq: Every evening | ORAL | 1 refills | Status: DC | PRN

## 2018-06-26 NOTE — Telephone Encounter (Signed)
Trazodone sent. Please tell her not to go above 100mg  on the traz

## 2018-06-26 NOTE — Addendum Note (Signed)
Addended by: Beatrice Lecher D on: 06/26/2018 09:27 PM   Modules accepted: Orders

## 2018-07-01 LAB — HM PAP SMEAR: HM Pap smear: NEGATIVE

## 2018-07-07 ENCOUNTER — Encounter: Payer: Self-pay | Admitting: Family Medicine

## 2018-07-07 ENCOUNTER — Ambulatory Visit (INDEPENDENT_AMBULATORY_CARE_PROVIDER_SITE_OTHER): Payer: BLUE CROSS/BLUE SHIELD | Admitting: Family Medicine

## 2018-07-07 VITALS — BP 113/65 | HR 75 | Ht 64.0 in | Wt 207.0 lb

## 2018-07-07 DIAGNOSIS — Z79890 Hormone replacement therapy (postmenopausal): Secondary | ICD-10-CM

## 2018-07-07 DIAGNOSIS — F5102 Adjustment insomnia: Secondary | ICD-10-CM

## 2018-07-07 DIAGNOSIS — F418 Other specified anxiety disorders: Secondary | ICD-10-CM | POA: Diagnosis not present

## 2018-07-07 DIAGNOSIS — F410 Panic disorder [episodic paroxysmal anxiety] without agoraphobia: Secondary | ICD-10-CM

## 2018-07-07 NOTE — Progress Notes (Addendum)
Subjective:    CC: Depression and anxiety   HPI:  F/U Depression/anxiety -please see previous note from about 3 weeks ago she had recently moved back into the area.  She had moved to [place] to take a job with [employer] but has not qualified to work remotely and has moved back into the area to help take care of her mother.  Previous PHQ 9 score of 12 today at 11.  Gad 7 score previous of 16 now 13.  Still rating her symptoms is very difficult.  We did increase her Effexor 250 mg.  So discussed possibly changing her medication.  She does report that her anxiety attacks have been much less.  She is now only getting about 3/day.  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  She starts to get really anxious and overwhelmed sometimes on phone calls.  And especially with travel.  She does feeling the increase in medication has been helpful and would like to stick with it.  She has not noted any negative side effects currently.  Has not had to take the clonazepam yet.  Her mom is now home and getting hospice care.  She is been able to barely stand and she is been having incontinence.  Also wanted to let me know that her OB/GYN here locally, Dr. Posey Pronto in [facility] recently started her on progesterone.  Past medical history, Surgical history, Family history not pertinant except as noted below, Social history, Allergies, and medications have been entered into the medical record, reviewed, and corrections made.   Review of Systems: No fevers, chills, night sweats, weight loss, chest pain, or shortness of breath.   Objective:    General: Well Developed, well nourished, and in no acute distress.  Neuro: Alert and oriented x3, extra-ocular muscles intact, sensation grossly intact.  HEENT: Normocephalic, atraumatic  Skin: Warm and dry, no rashes. Cardiac: Regular rate and rhythm, no murmurs rubs or gallops, no lower extremity edema.  Respiratory: Clear to auscultation  bilaterally. Not using accessory muscles, speaking in full sentences.   Impression and Recommendations:    Depression/anxiety -Continue with Effexor 150 mg daily.  Continue with trazodone for sleep.  Use the clonazepam sparingly as needed for panic attacks.  Will check on referral.  She says she was never contacted. F/U in 6-8 weeks.  Will complete FMLA for her for now.  She will fax [place] the paperwork.    Insomnia - continue with trazodone.  HRT - On progesterone from OB/GYN.

## 2018-07-09 ENCOUNTER — Encounter: Payer: Self-pay | Admitting: Family Medicine

## 2018-07-10 ENCOUNTER — Encounter: Payer: Self-pay | Admitting: Family Medicine

## 2018-07-11 ENCOUNTER — Telehealth: Payer: Self-pay | Admitting: *Deleted

## 2018-07-11 NOTE — Telephone Encounter (Signed)
Form completed,faxed,confirmation received and scanned into patient's chart..Gavynn Duvall Lynetta, CMA  

## 2018-07-14 ENCOUNTER — Encounter: Payer: Self-pay | Admitting: Family Medicine

## 2018-07-17 ENCOUNTER — Encounter: Payer: Self-pay | Admitting: Family Medicine

## 2018-07-22 ENCOUNTER — Ambulatory Visit (HOSPITAL_COMMUNITY): Payer: Self-pay | Admitting: Psychiatry

## 2018-08-11 ENCOUNTER — Encounter (HOSPITAL_COMMUNITY): Payer: Self-pay | Admitting: Psychiatry

## 2018-08-11 ENCOUNTER — Other Ambulatory Visit: Payer: Self-pay

## 2018-08-11 ENCOUNTER — Ambulatory Visit (INDEPENDENT_AMBULATORY_CARE_PROVIDER_SITE_OTHER): Payer: BLUE CROSS/BLUE SHIELD | Admitting: Psychiatry

## 2018-08-11 VITALS — BP 124/80 | Ht 64.0 in | Wt 203.0 lb

## 2018-08-11 DIAGNOSIS — F411 Generalized anxiety disorder: Secondary | ICD-10-CM | POA: Diagnosis not present

## 2018-08-11 DIAGNOSIS — F5105 Insomnia due to other mental disorder: Secondary | ICD-10-CM

## 2018-08-11 DIAGNOSIS — F410 Panic disorder [episodic paroxysmal anxiety] without agoraphobia: Secondary | ICD-10-CM

## 2018-08-11 DIAGNOSIS — F331 Major depressive disorder, recurrent, moderate: Secondary | ICD-10-CM | POA: Diagnosis not present

## 2018-08-11 DIAGNOSIS — Z79899 Other long term (current) drug therapy: Secondary | ICD-10-CM

## 2018-08-11 MED ORDER — BUSPIRONE HCL 7.5 MG PO TABS: 7.5000 mg | ORAL_TABLET | Freq: Two times a day (BID) | ORAL | 0 refills | Status: DC | PRN

## 2018-08-11 NOTE — Progress Notes (Signed)
Patient ID: Virginia Logan, female   DOB: 1971-03-09, 48 y.o.   MRN: [id number] [facility] Follow-up Outpatient Visit  Virginia Logan [id number] 48 y.o.  08/11/2018  Chief Complaint: follow up  History of Present Illness:   Patient returns for Medication Follow up and is diagnosed with Major depressive disorder moderate and generalized anxiety disorder. Panic disorder.  Returns after 1 year instead of 2 months.  Patient states that she moved to [place] and was working there.  Apparently she got stressed out and started having panic-like symptoms anxiety attacks she was not able to function well so she transferred back to [place] recently her mom is died with dementia.  Patient is having difficulty thinking about going back to work because of the panic-like symptoms primary care has added Klonopin she has seldom used but Effexor is now 150 mg she continues take trazodone but she is feeling distressed and panicking when she thinks about going back to work she is looking at other options as well  Insomnia: on trazadone.   Aggravating factor : job stress, relocation . Mom death Modifying factors; some friends  Severity of depression:worse more so anxiety as of now  Associated symptoms: insomnia at times. No psychosis  Past Medical History:  Diagnosis Date  . Anemia    Family History  Problem Relation Age of Onset  . Diabetes Mother   . Hyperlipidemia Mother   . Heart attack Mother   . Lung cancer Mother 75  . Cancer Father        prostate  . Graves' disease Father   . Depression Father   . Anxiety disorder Father   . Crohn's disease Brother   . Dementia Maternal Aunt   . Dementia Maternal Uncle   . Dementia Paternal Grandmother   . Bipolar disorder Neg Hx   . Schizophrenia Neg Hx   . OCD Neg Hx   . Alcohol abuse Neg Hx   . Drug abuse Neg Hx     Outpatient Encounter Medications as of 08/11/2018  Medication Sig  . albuterol (PROAIR HFA) 108 (90 Base)  MCG/ACT inhaler Inhale 2 puffs into the lungs every 6 (six) hours as needed for wheezing or shortness of breath.  . clonazePAM (KLONOPIN) 0.5 MG tablet Take 1 tablet (0.5 mg total) by mouth 2 (two) times daily as needed for anxiety.  Marland Kitchen estradiol (VIVELLE-DOT) 0.05 MG/24HR patch Place 0.05 mg onto the skin every Wednesday and Saturday.  . progesterone (PROMETRIUM) 100 MG capsule Take 1 capsule by mouth every evening.  . traZODone (DESYREL) 100 MG tablet Take 1 tablet (100 mg total) by mouth at bedtime as needed for sleep. Due for follow up visit  . venlafaxine XR (EFFEXOR-XR) 150 MG 24 hr capsule Take 1 capsule (150 mg total) by mouth daily with breakfast.  . busPIRone (BUSPAR) 7.5 MG tablet Take 1 tablet (7.5 mg total) by mouth 2 (two) times daily as needed.  . mometasone (NASONEX) 50 MCG/ACT nasal spray Place 1 spray into the nose daily. (Patient not taking: Reported on 08/11/2018)   No facility-administered encounter medications on file as of 08/11/2018.     Recent Results (from the past 2160 hour(s))  COMPLETE METABOLIC PANEL WITH GFR     Status: Abnormal   Collection Time: 06/16/18 10:53 AM  Result Value Ref Range   Glucose, Bld 105 (H) 65 - 99 mg/dL    Comment: .            Fasting reference interval .  For someone without known diabetes, a glucose value between 100 and 125 mg/dL is consistent with prediabetes and should be confirmed with a follow-up test. .    BUN 12 7 - 25 mg/dL   Creat 0.92 0.50 - 1.10 mg/dL   GFR, Est Non African American 73 > OR = 60 mL/min/1.73m2   GFR, Est African American 85 > OR = 60 mL/min/1.25m2   BUN/Creatinine Ratio NOT APPLICABLE 6 - 22 (calc)   Sodium 139 135 - 146 mmol/L   Potassium 4.6 3.5 - 5.3 mmol/L   Chloride 105 98 - 110 mmol/L   CO2 25 20 - 32 mmol/L   Calcium 9.8 8.6 - 10.2 mg/dL   Total Protein 7.3 6.1 - 8.1 g/dL   Albumin 4.4 3.6 - 5.1 g/dL   Globulin 2.9 1.9 - 3.7 g/dL (calc)   AG Ratio 1.5 1.0 - 2.5 (calc)   Total Bilirubin 0.3  0.2 - 1.2 mg/dL   Alkaline phosphatase (APISO) 99 33 - 115 U/L   AST 22 10 - 35 U/L   ALT 23 6 - 29 U/L  Lipid panel     Status: Abnormal   Collection Time: 06/16/18 10:53 AM  Result Value Ref Range   Cholesterol 242 (H) <200 mg/dL   HDL 48 (L) >50 mg/dL   Triglycerides 307 (H) <150 mg/dL    Comment: . If a non-fasting specimen was collected, consider repeat triglyceride testing on a fasting specimen if clinically indicated.  Yates Decamp et al. J. of Clin. Lipidol. 3300;7:622-633. Marland Kitchen    LDL Cholesterol (Calc) 147 (H) mg/dL (calc)    Comment: Reference range: <100 . Desirable range <100 mg/dL for primary prevention;   <70 mg/dL for patients with CHD or diabetic patients  with > or = 2 CHD risk factors. Marland Kitchen LDL-C is now calculated using the Martin-Hopkins  calculation, which is a validated novel method providing  better accuracy than the Friedewald equation in the  estimation of LDL-C.  Cresenciano Genre et al. Annamaria Helling. 3545;625(63): 2061-2068  (http://education.[facility].com/faq/FAQ164)    Total CHOL/HDL Ratio 5.0 (H) <5.0 (calc)   Non-HDL Cholesterol (Calc) 194 (H) <130 mg/dL (calc)    Comment: For patients with diabetes plus 1 major ASCVD risk  factor, treating to a non-HDL-C goal of <100 mg/dL  (LDL-C of <70 mg/dL) is considered a therapeutic  option.   TSH     Status: None   Collection Time: 06/16/18 10:53 AM  Result Value Ref Range   TSH 1.70 mIU/L    Comment:           Reference Range .           > or = 20 Years  0.40-4.50 .                Pregnancy Ranges           First trimester    0.26-2.66           Second trimester   0.55-2.73           Third trimester    0.43-2.91   Estradiol     Status: None   Collection Time: 06/16/18 10:53 AM  Result Value Ref Range   Estradiol <15 pg/mL    Comment:       Reference Range         Follicular Phase:    89-373         Mid-Cycle:           64-357  Luteal Phase:        18-299         Postmenopausal:      < or =  31 . Reference range established on post-pubertal patient population. No pre-pubertal reference range established using this assay. For any patients for whom low Estradiol levels are anticipated (e.g. males, pre-pubertal children and hypogonadal/post-menopausal  females), the [employer] Estradiol, Ultrasensitive, LCMSMS assay is recommended (order code [phone]). . Please note: patients being treated with the drug  fulvestrant (Faslodex(R)) have demonstrated significant  interference in immunoassay methods for estradiol  measurement. The cross reactivity could lead to falsely  elevated estradiol test results leading to an  inappropriate clinical assessment of estrogen status. [facility] order code 30289-Estradiol,  Ultrasensitive LC/MS/MS demonstrates negligible cross  re activity with fulvestrant.   Progesterone     Status: None   Collection Time: 06/16/18 10:53 AM  Result Value Ref Range   Progesterone <0.5 ng/mL    Comment:             Reference Ranges          Female             Follicular Phase     < 1.0             Luteal Phase      2.6-21.5             Post menopausal      < 0.5             Pregnancy             1st Trimester     4.1-34.0             2nd Trimester    24.0-76.0             3rd Trimester   52.0-302.0   FSH/LH     Status: None   Collection Time: 06/16/18 10:53 AM  Result Value Ref Range   FSH 51.2 mIU/mL    Comment:                     Reference Range .              Follicular Phase       6.7-89.3              Mid-cycle Peak         3.1-17.7              Luteal Phase           1.5- 9.1              Postmenopausal       23.0-116.3              .    LH 35.5 mIU/mL    Comment:     Reference Range Follicular Phase  8.1-01.7 Mid-Cycle Peak    8.7-76.3 Luteal Phase      0.5-16.9 Postmenopausal    10.0-54.7   HM PAP SMEAR     Status: None   Collection Time: 07/01/18 12:00 AM  Result Value Ref Range   HM Pap smear  negative     BP 124/80 (BP Location: Left Arm, Patient Position: Sitting, Cuff Size: Normal)   Ht 5\' 4"  (1.626 m)   Wt 203 lb (92.1 kg)   BMI 34.84 kg/m    Review of Systems  Constitutional: Negative for fever.  Cardiovascular: Negative  for palpitations.  Gastrointestinal: Negative for nausea.  Skin: Negative for rash.  Neurological: Negative for tingling and tremors.  Psychiatric/Behavioral: Positive for depression. Negative for substance abuse. The patient is nervous/anxious.     Mental Status Examination  Appearance: casual Alert: Yes Attention: fair  Cooperative: Yes Eye Contact: Fair Speech: normal tone Psychomotor Activity: Decreased Memory/Concentration: adequate Oriented: person, place, time/date and situation Mood:stressed Affect: Congruent Thought Processes and Associations: Coherent Fund of Knowledge: Fair Thought Content: Suicidal ideation and Homicidal ideation were denied Insight: Fair Judgement: Fair  Diagnosis/ Treatment: Maj. depressive disorder recurrent moderate.  Feeling subdued. Continue effexor. Will add buspar for anxiety and augmentation. Refer to therapy   Generalized anxiety disorder stressed . Add buspar, refer to therapy Panic disorder: feels panicky when thinks of going back to work . On leave.  hasnot taken klonopine for now. Add buspar and will refer to therapy  Fu 3 w, reviewed meds and if need come early   Merian Capron, MD 08/11/2018

## 2018-08-19 ENCOUNTER — Encounter: Payer: Self-pay | Admitting: Family Medicine

## 2018-08-19 ENCOUNTER — Other Ambulatory Visit: Payer: Self-pay

## 2018-08-19 ENCOUNTER — Telehealth (INDEPENDENT_AMBULATORY_CARE_PROVIDER_SITE_OTHER): Payer: BLUE CROSS/BLUE SHIELD | Admitting: Family Medicine

## 2018-08-19 VITALS — Ht 64.0 in | Wt 203.0 lb

## 2018-08-19 DIAGNOSIS — L639 Alopecia areata, unspecified: Secondary | ICD-10-CM | POA: Diagnosis not present

## 2018-08-19 DIAGNOSIS — F419 Anxiety disorder, unspecified: Secondary | ICD-10-CM | POA: Diagnosis not present

## 2018-08-19 MED ORDER — MINOXIDIL 5 % EX SOLN: CUTANEOUS | 0 refills | Status: DC

## 2018-08-19 MED ORDER — CLOBETASOL PROPIONATE 0.05 % EX SOLN: 1.0000 "application " | Freq: Every day | CUTANEOUS | 0 refills | Status: DC

## 2018-08-19 NOTE — Patient Instructions (Signed)
Follow up in 2-3 months

## 2018-08-19 NOTE — Progress Notes (Signed)
Virtual Visit via Telephone Note  I connected with Virginia Logan on 08/19/18 at 10:30 AM EDT by telephone and verified that I am speaking with the correct person using two identifiers.   I discussed the limitations, risks, security and privacy concerns of performing an evaluation and management service by telephone and the availability of in person appointments. I also discussed with the patient that there may be a patient responsible charge related to this service. The patient expressed understanding and agreed to proceed.   History of Present Illness: F/U Depression with Anxiety. She did have a consult with Dr. De Nurse earlier this month and he added Buspar.  She feels it may help her but makes her sleep.  She is on short term disability.  She is staying home and practicing social distancing.   She on Effexor XR 150mg .  Dr. De Nurse will be completed her additional paperwork for disability.  When even thinks about going back to work she feels like she is getting anxiety attack.  Veins will pop up and feel swollen. It can bleed very easily and noticed them on her arms and a few places on her legs.  She is worried that it may be related to her high cholesterol levels.  Her blood pressure actually looked great when she was here last time.  She also c/o increasing hair loss. She has a dx of alopecia areata.  Has tried minoxidil in the past and felt it wasn't very helpful.    Will get skin bump on her face and then will scab. By time if finally it is scarred or will have an indention in the skin.     Observations/Objective:   Assessment and Plan: Depression/Anxiety - Following with Dr. De Nurse.  Continue with Buspar though is has been sedating. Continue Effexor.  Will refer for telemedicine therapy with Janett Billow.   Swollen veins - asked her to send me a pic on MyChart. She had normal liver function in Jan.  BP was normal last month.   Alopecia Areata - will tx with topical minoxidil and clobetasol  for 2 mnths. Will refer to Derm for the summer.    Skin "bumps" - suspect folliculitis. Avoid scratching, rubbing or picking at the area to minimize scarring.   Call if any sign of infection.   Follow Up Instructions:  F/U in 2-3 months Referrals placed.    I discussed the assessment and treatment plan with the patient. The patient was provided an opportunity to ask questions and all were answered. The patient agreed with the plan and demonstrated an understanding of the instructions.   The patient was advised to call back or seek an in-person evaluation if the symptoms worsen or if the condition fails to improve as anticipated.   I provided 21 minutes of non-face-to-face time during this encounter.   Beatrice Lecher, MD

## 2018-09-02 ENCOUNTER — Ambulatory Visit (INDEPENDENT_AMBULATORY_CARE_PROVIDER_SITE_OTHER): Payer: BLUE CROSS/BLUE SHIELD | Admitting: Psychiatry

## 2018-09-02 ENCOUNTER — Encounter (HOSPITAL_COMMUNITY): Payer: Self-pay | Admitting: Psychiatry

## 2018-09-02 DIAGNOSIS — F411 Generalized anxiety disorder: Secondary | ICD-10-CM

## 2018-09-02 DIAGNOSIS — F331 Major depressive disorder, recurrent, moderate: Secondary | ICD-10-CM

## 2018-09-02 DIAGNOSIS — F410 Panic disorder [episodic paroxysmal anxiety] without agoraphobia: Secondary | ICD-10-CM

## 2018-09-02 MED ORDER — BUSPIRONE HCL 7.5 MG PO TABS: 7.5000 mg | ORAL_TABLET | Freq: Every day | ORAL | 0 refills | Status: DC | PRN

## 2018-09-02 NOTE — Progress Notes (Signed)
Patient ID: Virginia Logan, female   DOB: 10-Sep-1970, 48 y.o.   MRN: [id number] [facility] Follow-up Outpatient Visit Tele psych Lavone Barrientes [id number] 48 y.o.  09/02/2018  Chief Complaint: follow up  History of Present Illness:   Patient returns for Medication Follow up and is diagnosed with Major depressive disorder moderate and generalized anxiety disorder. Panic disorder.   connected with [name] on 09/02/18 at  4:15 PM EDT by telephone and verified that I am speaking with the correct person using two identifiers.   I discussed the limitations, risks, security and privacy concerns of performing an evaluation and management service by telephone and the availability of in person appointments. I also discussed with the patient that there may be a patient responsible charge related to this service. The patient expressed understanding and agreed to proceed.  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   Aggravating factor : job stress, Modifying factors; some friends  Severity of depression:worse more so anxiety as of now  Associated symptoms: insomnia at times. No psychosis  Past Medical History:  Diagnosis Date  . Anemia    Family History  Problem Relation Age of Onset  . Diabetes Mother   . Hyperlipidemia Mother   . Heart attack Mother   . Lung cancer Mother 35  . Cancer Father        prostate  . Graves' disease Father   . Depression Father   . Anxiety disorder Father   . Crohn's disease Brother   . Dementia Maternal Aunt   . Dementia Maternal Uncle   . Dementia Paternal Grandmother   . Bipolar disorder Neg Hx   . Schizophrenia Neg Hx   . OCD Neg Hx   . Alcohol abuse Neg Hx   . Drug abuse Neg Hx     Outpatient Encounter  Medications as of 09/02/2018  Medication Sig  . albuterol (PROAIR HFA) 108 (90 Base) MCG/ACT inhaler Inhale 2 puffs into the lungs every 6 (six) hours as needed for wheezing or shortness of breath.  . busPIRone (BUSPAR) 7.5 MG tablet Take 1 tablet (7.5 mg total) by mouth 2 (two) times daily as needed.  . clobetasol (TEMOVATE) 0.05 % external solution Apply 1 application topically at bedtime. After 2 weeks of use take a break for one week.  . clonazePAM (KLONOPIN) 0.5 MG tablet Take 1 tablet (0.5 mg total) by mouth 2 (two) times daily as needed for anxiety.  Marland Kitchen estradiol (VIVELLE-DOT) 0.05 MG/24HR patch Place 0.05 mg onto the skin every Wednesday and Saturday.  Marland Kitchen MINOXIDIL, TOPICAL, 5 % SOLN Apply 1 ml to affected area BID.  Marland Kitchen mometasone (NASONEX) 50 MCG/ACT nasal spray Place 1 spray into the nose daily. (Patient not taking: Reported on 08/19/2018)  . progesterone (PROMETRIUM) 100 MG capsule Take 1 capsule by mouth every evening.  . traZODone (DESYREL) 100 MG tablet Take 1 tablet (100 mg total) by mouth at bedtime as needed for sleep. Due for follow up visit  . venlafaxine XR (EFFEXOR-XR) 150 MG 24 hr capsule Take 1 capsule (150 mg total) by mouth daily with breakfast.   No facility-administered encounter medications on file as of 09/02/2018.     Recent Results (from the past 2160 hour(s))  COMPLETE METABOLIC PANEL WITH GFR  Status: Abnormal   Collection Time: 06/16/18 10:53 AM  Result Value Ref Range   Glucose, Bld 105 (H) 65 - 99 mg/dL    Comment: .            Fasting reference interval . For someone without known diabetes, a glucose value between 100 and 125 mg/dL is consistent with prediabetes and should be confirmed with a follow-up test. .    BUN 12 7 - 25 mg/dL   Creat 0.92 0.50 - 1.10 mg/dL   GFR, Est Non African American 73 > OR = 60 mL/min/1.52m2   GFR, Est African American 85 > OR = 60 mL/min/1.43m2   BUN/Creatinine Ratio NOT APPLICABLE 6 - 22 (calc)   Sodium 139 135 - 146  mmol/L   Potassium 4.6 3.5 - 5.3 mmol/L   Chloride 105 98 - 110 mmol/L   CO2 25 20 - 32 mmol/L   Calcium 9.8 8.6 - 10.2 mg/dL   Total Protein 7.3 6.1 - 8.1 g/dL   Albumin 4.4 3.6 - 5.1 g/dL   Globulin 2.9 1.9 - 3.7 g/dL (calc)   AG Ratio 1.5 1.0 - 2.5 (calc)   Total Bilirubin 0.3 0.2 - 1.2 mg/dL   Alkaline phosphatase (APISO) 99 33 - 115 U/L   AST 22 10 - 35 U/L   ALT 23 6 - 29 U/L  Lipid panel     Status: Abnormal   Collection Time: 06/16/18 10:53 AM  Result Value Ref Range   Cholesterol 242 (H) <200 mg/dL   HDL 48 (L) >50 mg/dL   Triglycerides 307 (H) <150 mg/dL    Comment: . If a non-fasting specimen was collected, consider repeat triglyceride testing on a fasting specimen if clinically indicated.  Yates Decamp et al. J. of Clin. Lipidol. 0263;7:858-850. Marland Kitchen    LDL Cholesterol (Calc) 147 (H) mg/dL (calc)    Comment: Reference range: <100 . Desirable range <100 mg/dL for primary prevention;   <70 mg/dL for patients with CHD or diabetic patients  with > or = 2 CHD risk factors. Marland Kitchen LDL-C is now calculated using the Martin-Hopkins  calculation, which is a validated novel method providing  better accuracy than the Friedewald equation in the  estimation of LDL-C.  Cresenciano Genre et al. Annamaria Helling. 2774;128(78): 2061-2068  (http://education.[facility].com/faq/FAQ164)    Total CHOL/HDL Ratio 5.0 (H) <5.0 (calc)   Non-HDL Cholesterol (Calc) 194 (H) <130 mg/dL (calc)    Comment: For patients with diabetes plus 1 major ASCVD risk  factor, treating to a non-HDL-C goal of <100 mg/dL  (LDL-C of <70 mg/dL) is considered a therapeutic  option.   TSH     Status: None   Collection Time: 06/16/18 10:53 AM  Result Value Ref Range   TSH 1.70 mIU/L    Comment:           Reference Range .           > or = 20 Years  0.40-4.50 .                Pregnancy Ranges           First trimester    0.26-2.66           Second trimester   0.55-2.73           Third trimester    0.43-2.91   Estradiol      Status: None   Collection Time: 06/16/18 10:53 AM  Result Value Ref Range   Estradiol <15 pg/mL  Comment:       Reference Range         Follicular Phase:    42-683         Mid-Cycle:           64-357         Luteal Phase:        56-214         Postmenopausal:      < or = 31 . Reference range established on post-pubertal patient population. No pre-pubertal reference range established using this assay. For any patients for whom low Estradiol levels are anticipated (e.g. males, pre-pubertal children and hypogonadal/post-menopausal  females), the [employer] Estradiol, Ultrasensitive, LCMSMS assay is recommended (order code [phone]). . Please note: patients being treated with the drug  fulvestrant (Faslodex(R)) have demonstrated significant  interference in immunoassay methods for estradiol  measurement. The cross reactivity could lead to falsely  elevated estradiol test results leading to an  inappropriate clinical assessment of estrogen status. [facility] order code 30289-Estradiol,  Ultrasensitive LC/MS/MS demonstrates negligible cross  re activity with fulvestrant.   Progesterone     Status: None   Collection Time: 06/16/18 10:53 AM  Result Value Ref Range   Progesterone <0.5 ng/mL    Comment:             Reference Ranges          Female             Follicular Phase     < 1.0             Luteal Phase      2.6-21.5             Post menopausal      < 0.5             Pregnancy             1st Trimester     4.1-34.0             2nd Trimester    24.0-76.0             3rd Trimester   52.0-302.0   FSH/LH     Status: None   Collection Time: 06/16/18 10:53 AM  Result Value Ref Range   FSH 51.2 mIU/mL    Comment:                     Reference Range .              Follicular Phase       2.2-97.9              Mid-cycle Peak         3.1-17.7              Luteal Phase           1.5- 9.1              Postmenopausal       23.0-116.3               .    LH 35.5 mIU/mL    Comment:     Reference Range Follicular Phase  8.9-21.1 Mid-Cycle Peak    8.7-76.3 Luteal Phase      0.5-16.9 Postmenopausal    10.0-54.7   HM PAP SMEAR     Status: None   Collection Time: 07/01/18 12:00 AM  Result Value Ref Range   HM Pap smear  negative     There were no vitals taken for this visit.   Review of Systems  Constitutional: Negative for fever.  Cardiovascular: Negative for chest pain.  Gastrointestinal: Negative for nausea.  Skin: Negative for rash.  Neurological: Negative for tingling and tremors.  Psychiatric/Behavioral: Negative for substance abuse. The patient is nervous/anxious.     Mental Status Examination  Appearance: casual Alert: Yes Attention: fair  Cooperative: Yes Eye Contact: Speech: normal tone Psychomotor Activity:  Memory/Concentration: adequate Oriented: person, place, time/date and situation Mood:somewhat stressed Affect: Congruent Thought Processes and Associations: Coherent Fund of Knowledge: Fair Thought Content: Suicidal ideation and Homicidal ideation were denied Insight: Fair Judgement: Fair  Diagnosis/ Treatment: Maj. depressive disorder recurrent moderate.  Not worse, continue effexor  Generalized anxiety disorder stressed . Add buspar, refer to therapy Panic disorder: panic more so related to joining back of work . Continue effexor, buspar hellped, lower dose to avoid sedation Scheduled for therapy to work on above anxiety   discussed the assessment and treatment plan with the patient. The patient was provided an opportunity to ask questions and all were answered. The patient agreed with the plan and demonstrated an understanding of the instructions.   The patient was advised to call back or seek an in-person evaluation if the symptoms worsen or if the condition fails to improve as anticipated.  I provided 59minutes of non-face-to-face time during this encounter. Fu 3 w, reviewed meds and if need  come early   Merian Capron, MD 09/02/2018

## 2018-09-09 ENCOUNTER — Other Ambulatory Visit (HOSPITAL_COMMUNITY): Payer: Self-pay | Admitting: Psychiatry

## 2018-09-12 ENCOUNTER — Other Ambulatory Visit (HOSPITAL_COMMUNITY): Payer: Self-pay | Admitting: Psychiatry

## 2018-09-15 ENCOUNTER — Other Ambulatory Visit (HOSPITAL_COMMUNITY): Payer: Self-pay | Admitting: Psychiatry

## 2018-09-18 ENCOUNTER — Other Ambulatory Visit: Payer: Self-pay | Admitting: Family Medicine

## 2018-09-20 ENCOUNTER — Encounter: Payer: Self-pay | Admitting: Family Medicine

## 2018-09-22 ENCOUNTER — Other Ambulatory Visit: Payer: Self-pay

## 2018-09-22 ENCOUNTER — Ambulatory Visit (INDEPENDENT_AMBULATORY_CARE_PROVIDER_SITE_OTHER): Payer: BLUE CROSS/BLUE SHIELD | Admitting: Family Medicine

## 2018-09-22 ENCOUNTER — Encounter: Payer: Self-pay | Admitting: Family Medicine

## 2018-09-22 VITALS — Ht 64.0 in

## 2018-09-22 DIAGNOSIS — R112 Nausea with vomiting, unspecified: Secondary | ICD-10-CM

## 2018-09-22 DIAGNOSIS — N393 Stress incontinence (female) (male): Secondary | ICD-10-CM | POA: Diagnosis not present

## 2018-09-22 DIAGNOSIS — R311 Benign essential microscopic hematuria: Secondary | ICD-10-CM | POA: Diagnosis not present

## 2018-09-22 MED ORDER — ALBUTEROL SULFATE HFA 108 (90 BASE) MCG/ACT IN AERS: 2.0000 | INHALATION_SPRAY | Freq: Four times a day (QID) | RESPIRATORY_TRACT | 2 refills | Status: DC | PRN

## 2018-09-22 MED ORDER — AMOXICILLIN-POT CLAVULANATE 875-125 MG PO TABS: 1.0000 | ORAL_TABLET | Freq: Two times a day (BID) | ORAL | 0 refills | Status: DC

## 2018-09-22 NOTE — Telephone Encounter (Signed)
Patient scheduled.

## 2018-09-22 NOTE — Progress Notes (Signed)
Pt reports that she hasn't had a period in over 1 year, this began this morning. Its bright read. She also said that he sxs have been going on x 2 wks. The pain is in her lower back. She continues to have diarrhea,(she said it watery w/nuggets) she said it comes on fast. She denies any changes in her diet. She has had a recent change in her medication recently with the progesterone which she said did not cause her any problems. She only reports that she has been trying to eat healthier. She has not had anymore episodes of vomiting.  She has had some trouble w/her bladder recently and has to wear panty liners for this.   Last pap was done a few months ago (in epic).Elouise Munroe, [place]

## 2018-09-22 NOTE — Progress Notes (Signed)
Virtual Visit via Video Note  I connected with Virginia Logan on 09/22/18 at  2:00 PM EDT by a video enabled telemedicine application and verified that I am speaking with the correct person using two identifiers.   I discussed the limitations of evaluation and management by telemedicine and the availability of in person appointments. The patient expressed understanding and agreed to proceed.  Patient was in her home and I was at work for this encounter.  Subjective:    CC: Back pain, nausea and vomiting.  HPI:  Pt reports that she hasn't had a period in over 1 year and started having bleeding about1.5 weeks ago and then again this morning. Its bright red. Not sure if was vaganil.  She has had not appetie and has 15 lbs.    About 2 weeks ago had low and mide left sided- back pain and felt like was running a low grade fever.  The pain is in her lower back. Then about a week ago had diarrhea dn then vomited.  Got better as the day went on and then happened again next morning.  She continues to have diarrhea,(she said it watery w/nuggets) she said it comes on fast. She denies any changes in her diet. She has had a recent change in her medication recently with the progesterone which she said did not cause her any problems. She only reports that she has been trying to eat healthier. She has not had anymore episodes of vomiting in the last 2 days.  Back pain is worse on the lowe left area.  Never dx with kidney stones.  Brother has had them.    She has had some trouble w/her bladder recently and has to wear panty liners for this.   Having some stress incontinence.  Has seen Dr. Posey Pronto about it.     Past medical history, Surgical history, Family history not pertinant except as noted below, Social history, Allergies, and medications have been entered into the medical record, reviewed, and corrections made.   Review of Systems: No fevers, chills, night sweats, weight loss, chest pain, or shortness of  breath.   Objective:    General: Speaking clearly in complete sentences without any shortness of breath.  Alert and oriented x3.  Normal judgment. No apparent acute distress.  Well-groomed.  Sitting on her porch at home.    Impression and Recommendations:   Blood in the urine.  Suspect UTI based on her description though she is not having any burning with urination but she is having frequency urgency and back pain as well as some blood.  Also consider that she could be having kidney stones though she is never had a problem with this before.  If her pain becomes more severe than what her to give me a call back or if she develops a fever.  I am concerned that it could be moving into the kidney so I did put her on Augmentin for 7 days versus 3 days.  Nausea or vomiting.  Could be related to the UTI.  Though she has not vomited in the last 2 days which is reassuring but she has lost a lot of weight.   Stress incontinence -currently just doing [place] exercises but plans to follow back up with Dr. Posey Pronto at some point to have surgery.  Back pain, left-sided-suspect related to bladder and/or kidney infection but consider could be musculoskeletal as well.   I discussed the assessment and treatment plan with the patient. The patient was  provided an opportunity to ask questions and all were answered. The patient agreed with the plan and demonstrated an understanding of the instructions.   The patient was advised to call back or seek an in-person evaluation if the symptoms worsen or if the condition fails to improve as anticipated.   Beatrice Lecher, MD

## 2018-09-23 ENCOUNTER — Encounter: Payer: Self-pay | Admitting: Family Medicine

## 2018-09-24 ENCOUNTER — Ambulatory Visit (INDEPENDENT_AMBULATORY_CARE_PROVIDER_SITE_OTHER): Payer: BLUE CROSS/BLUE SHIELD | Admitting: Licensed Clinical Social Worker

## 2018-09-24 ENCOUNTER — Other Ambulatory Visit: Payer: Self-pay

## 2018-09-24 ENCOUNTER — Encounter: Payer: Self-pay | Admitting: Family Medicine

## 2018-09-24 DIAGNOSIS — F331 Major depressive disorder, recurrent, moderate: Secondary | ICD-10-CM

## 2018-09-24 DIAGNOSIS — F411 Generalized anxiety disorder: Secondary | ICD-10-CM

## 2018-09-24 DIAGNOSIS — F410 Panic disorder [episodic paroxysmal anxiety] without agoraphobia: Secondary | ICD-10-CM

## 2018-09-24 LAB — URINALYSIS, ROUTINE W REFLEX MICROSCOPIC
Bacteria, UA: NONE SEEN /HPF
Bilirubin Urine: NEGATIVE
Glucose, UA: NEGATIVE
Hyaline Cast: NONE SEEN /LPF
Ketones, ur: NEGATIVE
Nitrite: NEGATIVE
Specific Gravity, Urine: 1.02 (ref 1.001–1.03)
WBC, UA: NONE SEEN /HPF (ref 0–5)
pH: 6.5 (ref 5.0–8.0)

## 2018-09-24 LAB — URINE CULTURE
MICRO NUMBER:: 429503
Result:: NO GROWTH
SPECIMEN QUALITY:: ADEQUATE
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
## 2018-09-25 ENCOUNTER — Ambulatory Visit (HOSPITAL_COMMUNITY): Payer: BLUE CROSS/BLUE SHIELD | Admitting: Licensed Clinical Social Worker

## 2018-09-25 ENCOUNTER — Other Ambulatory Visit: Payer: Self-pay | Admitting: *Deleted

## 2018-09-25 DIAGNOSIS — R319 Hematuria, unspecified: Secondary | ICD-10-CM

## 2018-09-25 MED ORDER — PROMETHAZINE HCL 25 MG PO TABS: 25.0000 mg | ORAL_TABLET | Freq: Three times a day (TID) | ORAL | 0 refills | Status: DC | PRN

## 2018-09-26 ENCOUNTER — Encounter: Payer: Self-pay | Admitting: Family Medicine

## 2018-09-26 ENCOUNTER — Other Ambulatory Visit: Payer: Self-pay

## 2018-09-26 ENCOUNTER — Ambulatory Visit: Payer: BLUE CROSS/BLUE SHIELD | Admitting: Family Medicine

## 2018-09-26 ENCOUNTER — Ambulatory Visit (INDEPENDENT_AMBULATORY_CARE_PROVIDER_SITE_OTHER): Payer: BLUE CROSS/BLUE SHIELD | Admitting: Family Medicine

## 2018-09-26 ENCOUNTER — Ambulatory Visit (INDEPENDENT_AMBULATORY_CARE_PROVIDER_SITE_OTHER): Payer: BLUE CROSS/BLUE SHIELD

## 2018-09-26 VITALS — BP 116/65 | HR 76 | Temp 98.0°F | Ht 64.0 in | Wt 196.0 lb

## 2018-09-26 DIAGNOSIS — N950 Postmenopausal bleeding: Secondary | ICD-10-CM

## 2018-09-26 DIAGNOSIS — R109 Unspecified abdominal pain: Secondary | ICD-10-CM

## 2018-09-26 DIAGNOSIS — R112 Nausea with vomiting, unspecified: Secondary | ICD-10-CM

## 2018-09-26 DIAGNOSIS — N809 Endometriosis, unspecified: Secondary | ICD-10-CM

## 2018-09-26 DIAGNOSIS — R10823 Right lower quadrant rebound abdominal tenderness: Secondary | ICD-10-CM | POA: Diagnosis not present

## 2018-09-26 MED ORDER — ESTRADIOL 0.025 MG/24HR TD PTTW: MEDICATED_PATCH | TRANSDERMAL | 3 refills | Status: DC

## 2018-09-26 NOTE — Progress Notes (Signed)
Established Patient Office Visit  Subjective:  Patient ID: Virginia Logan, female    DOB: 1970/09/28  Age: 48 y.o. MRN: [id number]  CC:  Chief Complaint  Patient presents with  . Vaginal Bleeding    HPI Virginia Logan presents for nausea, lower abdominal cramps and bleeding.  This is a follow-up for a virtual visit that was performed on April 27.  She had not had a period in about a year and had some light bleeding about a week and a half ago and then on the 27th of Arpil started to see some bright redblood.  Though she was not sure if it could also be coming from her bladder or vaginal area.  She says it was almost like a light.  She is also complained of decreased appetite as well as some mid sided left back pain and felt like she was running a low-grade fever.  She reported that over the weekend she is also experienced some diarrhea.  She was also started on the Vivelle-Dot and progesterone in February by her OB/GYN, Dr. Posey Pronto wondered if that could be contributing.  She is never had a prior history of kidney stones, but her older brother had them.  She has continued to have some nausea and diarrhea that seems to be worse in the mornings and then gets better as the day goes on.  She describes the diarrhea as watery.  She said she vomited a couple of times right after this started but has not vomited in the last couple of days..  I did place her on Augmentin to cover for possible UTI but she felt like it really was not helping.  She actually called the office back because she really was not feeling much better.  Urinalysis showed some whole red blood cells but no sign of infection.  She says she was told years ago that she might have endometriosis but never had cystoscopy to confirm this.  Also over the last week she is had some urinary incontinence.  She said she will just feel a little bit of pressure in her bladder like she has to go and as soon as she stands up her bladder completely  empties.   Past Medical History:  Diagnosis Date  . Anemia     Past Surgical History:  Procedure Laterality Date  . left ankle      Family History  Problem Relation Age of Onset  . Diabetes Mother   . Hyperlipidemia Mother   . Heart attack Mother   . Lung cancer Mother 67  . Cancer Father        prostate  . Graves' disease Father   . Depression Father   . Anxiety disorder Father   . Crohn's disease Brother   . Dementia Maternal Aunt   . Dementia Maternal Uncle   . Dementia Paternal Grandmother   . Bipolar disorder Neg Hx   . Schizophrenia Neg Hx   . OCD Neg Hx   . Alcohol abuse Neg Hx   . Drug abuse Neg Hx     Social History   Socioeconomic History  . Marital status: Divorced    Spouse name: Not on file  . Number of children: Not on file  . Years of education: Not on file  . Highest education level: Not on file  Occupational History  . Not on file  Social Needs  . Financial resource strain: Not on file  . Food insecurity:    Worry: Not on  file    Inability: Not on file  . Transportation needs:    Medical: Not on file    Non-medical: Not on file  Tobacco Use  . Smoking status: Former Smoker    Packs/day: 0.30    Types: Cigarettes  . Smokeless tobacco: Never Used  Substance and Sexual Activity  . Alcohol use: No  . Drug use: No    Comment: Caffeine: Coffee 2-3 cupsper day.   Marland Kitchen Sexual activity: Yes    Partners: Male    Comment: Patient denies sexual side effects.  Lifestyle  . Physical activity:    Days per week: Not on file    Minutes per session: Not on file  . Stress: Not on file  Relationships  . Social connections:    Talks on phone: Not on file    Gets together: Not on file    Attends religious service: Not on file    Active member of club or organization: Not on file    Attends meetings of clubs or organizations: Not on file    Relationship status: Not on file  . Intimate partner violence:    Fear of current or ex partner: Not on file     Emotionally abused: Not on file    Physically abused: Not on file    Forced sexual activity: Not on file  Other Topics Concern  . Not on file  Social History Narrative  . Not on file    Outpatient Medications Prior to Visit  Medication Sig Dispense Refill  . albuterol (PROAIR HFA) 108 (90 Base) MCG/ACT inhaler Inhale 2 puffs into the lungs every 6 (six) hours as needed for wheezing or shortness of breath. 1 Inhaler 2  . amoxicillin-clavulanate (AUGMENTIN) 875-125 MG tablet Take 1 tablet by mouth 2 (two) times daily. 14 tablet 0  . clobetasol (TEMOVATE) 0.05 % external solution Apply 1 application topically at bedtime. After 2 weeks of use take a break for one week. 50 mL 0  . clonazePAM (KLONOPIN) 0.5 MG tablet Take 1 tablet (0.5 mg total) by mouth 2 (two) times daily as needed for anxiety. 20 tablet 0  . MINOXIDIL, TOPICAL, 5 % SOLN Apply 1 ml to affected area BID. 120 mL 0  . progesterone (PROMETRIUM) 100 MG capsule Take 1 capsule by mouth every evening.    . promethazine (PHENERGAN) 25 MG tablet Take 1 tablet (25 mg total) by mouth every 8 (eight) hours as needed for nausea or vomiting. 20 tablet 0  . traZODone (DESYREL) 100 MG tablet Take 1 tablet (100 mg total) by mouth at bedtime as needed for sleep. Due for follow up visit 90 tablet 1  . venlafaxine XR (EFFEXOR-XR) 150 MG 24 hr capsule TAKE 1 CAPSULE (150 MG TOTAL) BY MOUTH DAILY WITH BREAKFAST. 90 capsule 0  . estradiol (VIVELLE-DOT) 0.05 MG/24HR patch Place 0.05 mg onto the skin every Wednesday and Saturday.     No facility-administered medications prior to visit.     Allergies  Allergen Reactions  . Bactrim [Sulfamethoxazole-Trimethoprim] Anaphylaxis  . Ambien [Zolpidem] Other (See Comments)    Emotional Instability.  . Paxil [Paroxetine Hcl] Other (See Comments)    Excess sedation   . Moxifloxacin Rash    ROS Review of Systems    Objective:    Physical Exam  Constitutional: She is oriented to person, place,  and time. She appears well-developed and well-nourished.  HENT:  Head: Normocephalic and atraumatic.  Eyes: Conjunctivae and EOM are normal.  Cardiovascular: Normal  rate.  Pulmonary/Chest: Effort normal. Bleeding: Vaginal vaginal vault.  Abdominal: Soft. She exhibits no distension and no mass. There is abdominal tenderness. There is no rebound and no guarding.  Mild tenderness in the right lower quadrant.  Genitourinary:    Vagina normal.  There is no rash on the right labia. There is no rash on the left labia.    Genitourinary Comments: Vaginal vault with blood and blood at the os. No sign of urethral irritation or swelling or prolapse.    Musculoskeletal:     Comments: No CVA tenderness.  Neurological: She is alert and oriented to person, place, and time.  Skin: Skin is dry. No pallor.  Psychiatric: She has a normal mood and affect. Her behavior is normal.  Vitals reviewed.   BP 116/65   Pulse 76   Temp 98 F (36.7 C)   Ht 5\' 4"  (1.626 m)   Wt 196 lb (88.9 kg)   SpO2 99%   BMI 33.64 kg/m  Wt Readings from Last 3 Encounters:  09/26/18 196 lb (88.9 kg)  08/19/18 203 lb (92.1 kg)  07/07/18 207 lb (93.9 kg)     Health Maintenance Due  Topic Date Due  . MAMMOGRAM  10/24/2017  . COLONOSCOPY  07/17/2018    There are no preventive care reminders to display for this patient.  Lab Results  Component Value Date   TSH 1.70 06/16/2018   Lab Results  Component Value Date   WBC 6.9 03/26/2017   HGB 16.3 (H) 03/26/2017   HCT 46.9 (H) 03/26/2017   MCV 87.2 03/26/2017   PLT 237 03/26/2017   Lab Results  Component Value Date   NA 139 06/16/2018   K 4.6 06/16/2018   CO2 25 06/16/2018   GLUCOSE 105 (H) 06/16/2018   BUN 12 06/16/2018   CREATININE 0.92 06/16/2018   BILITOT 0.3 06/16/2018   ALKPHOS 102 11/28/2015   AST 22 06/16/2018   ALT 23 06/16/2018   PROT 7.3 06/16/2018   ALBUMIN 4.3 11/28/2015   CALCIUM 9.8 06/16/2018   Lab Results  Component Value Date   CHOL  242 (H) 06/16/2018   Lab Results  Component Value Date   HDL 48 (L) 06/16/2018   Lab Results  Component Value Date   LDLCALC 147 (H) 06/16/2018   Lab Results  Component Value Date   TRIG 307 (H) 06/16/2018   Lab Results  Component Value Date   CHOLHDL 5.0 (H) 06/16/2018   Lab Results  Component Value Date   HGBA1C 5.5 10/19/2010      Assessment & Plan:   Problem List Items Addressed This Visit      Other   Endometriosis    Other Visit Diagnoses    Post-menopausal bleeding    -  Primary   Relevant Orders   US Pelvic Complete With Transvaginal (Completed)   Left flank pain       Right lower quadrant abdominal tenderness with rebound tenderness       Non-intractable vomiting with nausea, unspecified vomiting type         Postmenopausal bleeding-she was started on hormone therapy almost 3 months ago and has had some postmenopausal bleeding over the last day 8 days.  Vaginal exam did confirm that the blood is coming from the uterus I did not see any blood right at the urethra.  We will start with pelvic ultrasound.  We will likely need to adjust her hormone therapy as this is the most likely cause for  her postmenopausal bleeding.  Go ahead and decrease dose on the Vivelle-Dot down to 0.025.  May need to consider endometrial biopsy as well.  Nausea-okay to use Phenergan as needed.  Certainly hormones can cause some nausea but it would be unusual to start after her to being on the medication for 2 months.  Her dose did not change recently.  Urinary incontinence-I do not have a great explanation for the sudden incontinence.  Again we will start with pelvic ultrasound and possibly move forward with CT to look for kidney stones.  Right lower quadrant tenderness-we will move forward with pelvic ultrasound.  Flank pain-still consider could have some kidney stones as I do not have a great explanation for the nausea and diarrhea.  Meds ordered this encounter  Medications  .  estradiol (VIVELLE-DOT) 0.025 MG/24HR    Sig: 1 patch onto the skin every Wed and Sat    Dispense:  8 patch    Refill:  3    Follow-up: No follow-ups on file.    Beatrice Lecher, MD

## 2018-09-26 NOTE — Progress Notes (Deleted)
Acute Office Visit  Subjective:    Patient ID: Virginia Logan, female    DOB: March 31, 1971, 48 y.o.   MRN: [id number]  No chief complaint on file.   HPI Patient is in today for    She reports that she is not really feeling any better she is still having cramps, some nausea, and diarrhea unable to keep anything down. She still c/o having some blood like a period the blood is not heavy. She feels that this is caused by the hormone medication that she was currently prescribed on 07/01/2018 (Estradiol 0.05 mg patch Q Wed/Sat and Progesterone 100 mg) and thinks that she should d/c.   Past Medical History:  Diagnosis Date  . Anemia     Past Surgical History:  Procedure Laterality Date  . left ankle      Family History  Problem Relation Age of Onset  . Diabetes Mother   . Hyperlipidemia Mother   . Heart attack Mother   . Lung cancer Mother 53  . Cancer Father        prostate  . Graves' disease Father   . Depression Father   . Anxiety disorder Father   . Crohn's disease Brother   . Dementia Maternal Aunt   . Dementia Maternal Uncle   . Dementia Paternal Grandmother   . Bipolar disorder Neg Hx   . Schizophrenia Neg Hx   . OCD Neg Hx   . Alcohol abuse Neg Hx   . Drug abuse Neg Hx     Social History   Socioeconomic History  . Marital status: Divorced    Spouse name: Not on file  . Number of children: Not on file  . Years of education: Not on file  . Highest education level: Not on file  Occupational History  . Not on file  Social Needs  . Financial resource strain: Not on file  . Food insecurity:    Worry: Not on file    Inability: Not on file  . Transportation needs:    Medical: Not on file    Non-medical: Not on file  Tobacco Use  . Smoking status: Former Smoker    Packs/day: 0.30    Types: Cigarettes  . Smokeless tobacco: Never Used  Substance and Sexual Activity  . Alcohol use: No  . Drug use: No    Comment: Caffeine: Coffee 2-3 cupsper day.   Marland Kitchen Sexual  activity: Yes    Partners: Male    Comment: Patient denies sexual side effects.  Lifestyle  . Physical activity:    Days per week: Not on file    Minutes per session: Not on file  . Stress: Not on file  Relationships  . Social connections:    Talks on phone: Not on file    Gets together: Not on file    Attends religious service: Not on file    Active member of club or organization: Not on file    Attends meetings of clubs or organizations: Not on file    Relationship status: Not on file  . Intimate partner violence:    Fear of current or ex partner: Not on file    Emotionally abused: Not on file    Physically abused: Not on file    Forced sexual activity: Not on file  Other Topics Concern  . Not on file  Social History Narrative  . Not on file    Outpatient Medications Prior to Visit  Medication Sig Dispense Refill  .  albuterol (PROAIR HFA) 108 (90 Base) MCG/ACT inhaler Inhale 2 puffs into the lungs every 6 (six) hours as needed for wheezing or shortness of breath. 1 Inhaler 2  . amoxicillin-clavulanate (AUGMENTIN) 875-125 MG tablet Take 1 tablet by mouth 2 (two) times daily. 14 tablet 0  . clobetasol (TEMOVATE) 0.05 % external solution Apply 1 application topically at bedtime. After 2 weeks of use take a break for one week. 50 mL 0  . clonazePAM (KLONOPIN) 0.5 MG tablet Take 1 tablet (0.5 mg total) by mouth 2 (two) times daily as needed for anxiety. 20 tablet 0  . estradiol (VIVELLE-DOT) 0.05 MG/24HR patch Place 0.05 mg onto the skin every Wednesday and Saturday.    Marland Kitchen MINOXIDIL, TOPICAL, 5 % SOLN Apply 1 ml to affected area BID. 120 mL 0  . progesterone (PROMETRIUM) 100 MG capsule Take 1 capsule by mouth every evening.    . promethazine (PHENERGAN) 25 MG tablet Take 1 tablet (25 mg total) by mouth every 8 (eight) hours as needed for nausea or vomiting. 20 tablet 0  . traZODone (DESYREL) 100 MG tablet Take 1 tablet (100 mg total) by mouth at bedtime as needed for sleep. Due for  follow up visit 90 tablet 1  . venlafaxine XR (EFFEXOR-XR) 150 MG 24 hr capsule TAKE 1 CAPSULE (150 MG TOTAL) BY MOUTH DAILY WITH BREAKFAST. 90 capsule 0   No facility-administered medications prior to visit.     Allergies  Allergen Reactions  . Bactrim [Sulfamethoxazole-Trimethoprim] Anaphylaxis  . Ambien [Zolpidem] Other (See Comments)    Emotional Instability.  . Paxil [Paroxetine Hcl] Other (See Comments)    Excess sedation   . Moxifloxacin Rash    ROS     Objective:    Physical Exam  There were no vitals taken for this visit. Wt Readings from Last 3 Encounters:  08/19/18 203 lb (92.1 kg)  07/07/18 207 lb (93.9 kg)  06/16/18 211 lb (95.7 kg)    Health Maintenance Due  Topic Date Due  . MAMMOGRAM  10/24/2017  . COLONOSCOPY  07/17/2018    There are no preventive care reminders to display for this patient.   Lab Results  Component Value Date   TSH 1.70 06/16/2018   Lab Results  Component Value Date   WBC 6.9 03/26/2017   HGB 16.3 (H) 03/26/2017   HCT 46.9 (H) 03/26/2017   MCV 87.2 03/26/2017   PLT 237 03/26/2017   Lab Results  Component Value Date   NA 139 06/16/2018   K 4.6 06/16/2018   CO2 25 06/16/2018   GLUCOSE 105 (H) 06/16/2018   BUN 12 06/16/2018   CREATININE 0.92 06/16/2018   BILITOT 0.3 06/16/2018   ALKPHOS 102 11/28/2015   AST 22 06/16/2018   ALT 23 06/16/2018   PROT 7.3 06/16/2018   ALBUMIN 4.3 11/28/2015   CALCIUM 9.8 06/16/2018   Lab Results  Component Value Date   CHOL 242 (H) 06/16/2018   Lab Results  Component Value Date   HDL 48 (L) 06/16/2018   Lab Results  Component Value Date   LDLCALC 147 (H) 06/16/2018   Lab Results  Component Value Date   TRIG 307 (H) 06/16/2018   Lab Results  Component Value Date   CHOLHDL 5.0 (H) 06/16/2018   Lab Results  Component Value Date   HGBA1C 5.5 10/19/2010       Assessment & Plan:   Problem List Items Addressed This Visit    None  No orders of the defined  types were placed in this encounter.    Beatrice Lecher, MD

## 2018-09-29 ENCOUNTER — Encounter: Payer: Self-pay | Admitting: Family Medicine

## 2018-09-29 ENCOUNTER — Ambulatory Visit (INDEPENDENT_AMBULATORY_CARE_PROVIDER_SITE_OTHER): Payer: BLUE CROSS/BLUE SHIELD | Admitting: Licensed Clinical Social Worker

## 2018-09-29 ENCOUNTER — Other Ambulatory Visit: Payer: Self-pay

## 2018-09-29 DIAGNOSIS — F410 Panic disorder [episodic paroxysmal anxiety] without agoraphobia: Secondary | ICD-10-CM | POA: Diagnosis not present

## 2018-09-29 DIAGNOSIS — F411 Generalized anxiety disorder: Secondary | ICD-10-CM

## 2018-09-29 DIAGNOSIS — F331 Major depressive disorder, recurrent, moderate: Secondary | ICD-10-CM | POA: Diagnosis not present

## 2018-09-29 NOTE — Progress Notes (Signed)
Virtual Visit via Telephone Note  I connected with Virginia Logan on 09/29/18 at 11:00 AM EDT by telephone and verified that I am speaking with the correct person using two identifiers.   I discussed the limitations, risks, security and privacy concerns of performing an evaluation and management service by telephone and the availability of in person appointments. I also discussed with the patient that there may be a patient responsible charge related to this service. The patient expressed understanding and agreed to proceed.  Follow Up Instructions:    I discussed the assessment and treatment plan with the patient. The patient was provided an opportunity to ask questions and all were answered. The patient agreed with the plan and demonstrated an understanding of the instructions.   The patient was advised to call back or seek an in-person evaluation if the symptoms worsen or if the condition fails to improve as anticipated.  I provided 55 minutes of non-face-to-face time during this encounter.   THERAPIST PROGRESS NOTE  Session Time: 11:02 AM to 11:57 AM  Participation Level: Active  Behavioral Response: CasualAlertAnxious, Dysphoric and   Type of Therapy: Individual Therapy  Treatment Goals addressed:  decrease anxiety, depression, coping skills for panic, stress management  Interventions: CBT, Motivational Interviewing, Play Therapy, Supportive and Reframing  Summary: Virginia Logan is a 48 y.o. female who presents with not doing well, has pain that comes and goes, (describes it as razor blade in her stomach) vomiting and diarrhea, upset stomach, unable to finish projects. She is supposed to have a biopsy, reviewed physician's note and discussed looks like they are doing more testing to know what is going on. Has a lot of faith in her doctor. She is weak and no energy to even go shopping, went well with groceries being delivered.  She trying to go easy and relax, not off the deep end  but trying to relax. Lower dose on hormones, not retaining food, trying to keep hydrated, incontinence as difficult, makes it hard to go anyway, may be uterine cancer and wants to just get it done with if needs hysterectomy. Was bleeding 6 days then stopped and now back for 12 days.  In waiting mode feels like this has been her whole life, a message that she has to stop and be still, that is what she is doing, "is what it is". Feels lonely and sad, uncertain about everything, wonder where friends are, has told them she has been having issues, she always has been helpful to them and they are not reciprocating.  On an [place] and nobody cares, has been that way with friends for a while.  When mom was sick they were not available, COVID is part of it but they get out and see other friends, realizes also that they have other issues going on in her life that keeps him occupied.    Shared that life is a transition, but its about change and then adjusting to it, shares he was getting happen, wants to settle down to be consistent.  Describes difficulty of finding friends who can be good support, attracts the wrong people.  Does not want to talk to son or brother about this, but son does not want to freak him out until knows what is really going on.  Describes patient as inpatient and wants to get it over with. brother and son, not freak up until process it, Understand big picture, before knowing little pieces, impatient and get it over with it,  Alone a  long time, feels stuck with how she is feeling right now.  Therapist discussed taking the initiative with activities, patient shares she has learned to let go people controlling her happiness and wellbeing.  Although disappointed with friends, harder time meeting new friends.  Is outgoing and likes people. Discussed engaging activities like meet up, doing things on her own when she feels better such as traveling.  Went to church when things were getting bad.  Enjoys  being on her patio there are lots of birds to watch, wildlife.  Ready to move on with life, whatever comes up just get through and move on, don't care whatever it is, can't get worse. Hit bottom, that it has been hard for a long time, one major life after another for 5 years and tired. Reviewed symptoms that emotionally started having problems in September and October. Had been in  [place] since April, it was awful to move that far and be away from son, mom, cried six hours when left, away from, not meeting people there who she would want to have relationships with. Reviewed move there due to job with [employer] not going well not happy wanted to go home. Plan was to go home and speeded up when mom got sick. Came home and trying to do what was needed for job, traveling and taking care of mom at same time. Supervisor shared that her voice started to sound anxious on the phone, work not going well with mental health symptoms. Time off so not as bad, not having anxiety attacks all day, chest hurting when she talks about it now. Patient shared chest started hurting in session, guided her through deep breathing and encouraged her to focus on something going on in the present.  Loved what she was doing  It was her dream job but don't think she can do it and became dysphoric. Patient countered this on her own and said she guesses she has to hang in there, remind herself that change is good, therapist reinforced this.  Discussed paying attention to attitude, looking at attitudes that are helpful and accurate. Suicidal/Homicidal: No   Therapist Response: Therapist assessed patient current functioning and processed feelings related to current stressors.  Work with patient on attitude that will help her be effective with coping, encouraged patient with her own helpful way of looking at situation.  For example, paying attention to the message when exposed to learn from experiences, that hardships help [place] to have more awareness  and also help [place] learn things we need to learn.  Discussed that change some positive aspect of life but also can be very challenging, changes positive is that is how we grow in her life and brings opportunities. Encouraged for now while she is waiting to find out about test results, distraction is helpful, staying present focused, explained this is a helpful approach when dealing with lots of different things to focus on what she needs to do right now.  Then after doing what she can do right now let the worry go and focus on something else that is important to her Encourage patient with taking a broader perspective in life and recognizing to her own life experiences that life goes up and down and this is a pattern in life so things will inevitably get better and things to be hopeful for. Validated patient on how she felt about her friends and reasonable expectation that they would be supportive of her the way she is supportive of them.  Explored  picking up the phone to be more clear of her needs and may help with better support.  Also encouraged patient a strategy of doing things she enjoys, that will help improve mood and explored this with patient such as being outside on her deck and enjoying watching wildlife.  These activities will help her feel better.  Provided patient education on panic as she started to get symptoms in session that it is Musician, her body is getting ready to react when we perceive something is dangerous.  Introduced deep breathing as patient started to struggle and guided her through deep breathing exercise.  Discussed activity helps parts of brain that escalate panic and deep breathing actually produces readjustment of carbon dioxide and oxygen that helps decrease the symptoms.  Also encourage patient to focus on what is going on right now as she was triggered by discussions of going back to work.   Encourage patient with examining perspectives and looking at them closer,  readjusting when they making things worse and looking for perspectives that are accurate and helpful.  Bided strength based and supportive intervention.   Plan: Return again in 1 weeks.2.  Therapist work with patient on decrease of symptoms of anxiety, depression, panic, stress management and coping  Diagnosis: Axis I:  major depressive disorder, recurrent, moderate, generalized anxiety disorder, panic disorder    Axis II: No diagnosis    Cordella Register, LCSW 09/29/2018

## 2018-10-03 ENCOUNTER — Encounter: Payer: Self-pay | Admitting: Family Medicine

## 2018-10-03 DIAGNOSIS — R102 Pelvic and perineal pain: Secondary | ICD-10-CM

## 2018-10-03 DIAGNOSIS — R197 Diarrhea, unspecified: Secondary | ICD-10-CM

## 2018-10-06 NOTE — Telephone Encounter (Signed)
CT ordered.  She will need to come by do a urine pregnancy test before the scan as it is required.  She can either do that downstairs or here in our office.

## 2018-10-07 ENCOUNTER — Encounter: Payer: Self-pay | Admitting: Family Medicine

## 2018-10-07 ENCOUNTER — Ambulatory Visit (INDEPENDENT_AMBULATORY_CARE_PROVIDER_SITE_OTHER): Payer: BLUE CROSS/BLUE SHIELD | Admitting: Osteopathic Medicine

## 2018-10-07 ENCOUNTER — Other Ambulatory Visit: Payer: Self-pay

## 2018-10-07 ENCOUNTER — Ambulatory Visit (INDEPENDENT_AMBULATORY_CARE_PROVIDER_SITE_OTHER): Payer: BLUE CROSS/BLUE SHIELD

## 2018-10-07 ENCOUNTER — Encounter: Payer: Self-pay | Admitting: Osteopathic Medicine

## 2018-10-07 VITALS — BP 137/75 | HR 92 | Temp 98.2°F | Wt 192.9 lb

## 2018-10-07 DIAGNOSIS — N950 Postmenopausal bleeding: Secondary | ICD-10-CM

## 2018-10-07 DIAGNOSIS — R197 Diarrhea, unspecified: Secondary | ICD-10-CM | POA: Diagnosis not present

## 2018-10-07 DIAGNOSIS — R102 Pelvic and perineal pain: Secondary | ICD-10-CM

## 2018-10-07 DIAGNOSIS — R112 Nausea with vomiting, unspecified: Secondary | ICD-10-CM

## 2018-10-07 MED ORDER — IOPAMIDOL (ISOVUE-300) INJECTION 61%
100.0000 mL | Freq: Once | INTRAVENOUS | Status: AC | PRN
  Administered 2018-10-07: 12:00:00 100 mL via INTRAVENOUS

## 2018-10-07 NOTE — Progress Notes (Signed)
ENDOMETRIAL BIOPSY PROCEDURE NOTE   PRIOR TO PROCEDURE: INFORMED CONSENT OBTAINED: yes SEE SCANNED DOCUMENTS ANY PRETREATMENT: no  PHYSICAL EXAM: GYN: No lesions/ulcers to external genitalia, normal urethra, normal vaginal mucosa, physiologic discharge, cervix normal with cystic lesions on 10 and 11 o'clock positions, uterus not enlarged or tender, adnexa no masses and nontender  DESCRIPTION OF PROCEDURE: Vaginal speculum placed. Cervix and proximal vagina cleaned with Betadine.Tenaculum applied at 12:00 cervical position and gentle traction applied.  Attempted to pass the [place] to obtain endometrial sample but met with cervical stenosis and patient was unable to tolerate procedure.  Procedure was aborted.   Post-menopausal bleeding - Plan: Ambulatory referral to Obstetrics / Gynecology      She and I discussed options going forward.  She would like to see a different OB/GYN from whom she has been seeing.  She would really like to discuss a hysterectomy given family history of cancer and personal concerns with her symptoms.  I advised that this would be something that she would have to talk to OB/GYN about, they may want to proceed with endometrial biopsy and this may have to be done with dilator/ultrasound guidance, patient may benefit from premedication with antianxiety treatment.      Total time spent: 15 minutes, greater than 50% of this spent face-to-face counseling and coordinating care for diagnosis of postmenopausal bleeding

## 2018-10-08 ENCOUNTER — Ambulatory Visit (HOSPITAL_COMMUNITY): Payer: BLUE CROSS/BLUE SHIELD | Admitting: Licensed Clinical Social Worker

## 2018-10-08 ENCOUNTER — Encounter: Payer: Self-pay | Admitting: Family Medicine

## 2018-10-09 ENCOUNTER — Telehealth (INDEPENDENT_AMBULATORY_CARE_PROVIDER_SITE_OTHER): Payer: BLUE CROSS/BLUE SHIELD | Admitting: Gastroenterology

## 2018-10-09 ENCOUNTER — Encounter: Payer: Self-pay | Admitting: Gastroenterology

## 2018-10-09 ENCOUNTER — Other Ambulatory Visit: Payer: Self-pay

## 2018-10-09 VITALS — Ht 65.0 in | Wt 192.0 lb

## 2018-10-09 DIAGNOSIS — Z8379 Family history of other diseases of the digestive system: Secondary | ICD-10-CM | POA: Diagnosis not present

## 2018-10-09 DIAGNOSIS — R197 Diarrhea, unspecified: Secondary | ICD-10-CM

## 2018-10-09 DIAGNOSIS — N809 Endometriosis, unspecified: Secondary | ICD-10-CM

## 2018-10-09 DIAGNOSIS — R935 Abnormal findings on diagnostic imaging of other abdominal regions, including retroperitoneum: Secondary | ICD-10-CM | POA: Diagnosis not present

## 2018-10-09 DIAGNOSIS — R112 Nausea with vomiting, unspecified: Secondary | ICD-10-CM

## 2018-10-09 DIAGNOSIS — K921 Melena: Secondary | ICD-10-CM

## 2018-10-09 MED ORDER — PROMETHAZINE HCL 25 MG PO TABS: 25.0000 mg | ORAL_TABLET | Freq: Three times a day (TID) | ORAL | 3 refills | Status: DC | PRN

## 2018-10-09 MED ORDER — SUPREP BOWEL PREP KIT 17.5-3.13-1.6 GM/177ML PO SOLN: 1.0000 | ORAL | 0 refills | Status: DC

## 2018-10-09 NOTE — Progress Notes (Signed)
Chief Complaint: CRC Screening, abnormal CT finding, diarrhea, vomiting   Referring Provider:     Hali Marry, MD    HPI:    Due to current restrictions/limitations of in-office visits due to the COVID-19 pandemic, this scheduled clinical appointment was converted to a telehealth virtual consultation using D[employer].  -Time of medical discussion: 25 minutes -The patient did consent to this virtual visit and is aware of possible charges through their insurance for this visit.  -Names of all parties present: Virginia Logan (patient), Gerrit Heck, DO, [facility] (physician) -Patient location: Home -Physician location: Office  Virginia Logan is a 48 y.o. female referred to the Gastroenterology Clinic for evaluation of routine CRC screening along with evaluation of GI symptoms (nausea, vomiting, diarrhea) and a recent abnormal CT finding.    She was initially seen by her PCM, Dr. Charise Carwin, on 09/26/2018, with c/o new onset nausea, lower abdominal cramping, watery diarrhea.  Was given Rx for Phenergan and evaluated with pelvic ultrasound (slightly thickened endometrium; referred to GYN at [facility]).  Due to ongoing symptoms, CT abdomen/pelvis on 5/12 n/f mild sigmoid diverticulosis without diverticulitis with slightly prominent tissue at the tip of the cecum (collapsed bowel versus stool versus mucosal mass), otherwise normal.  I reviewed that CT today.  Today she states has nausea daily and vomiting 3-5 days/week for hte last 8 weeks. Watery stools as well, but 1-3 BM/day. Has decreased PO intake due to decreased appetite with these sxs. Having 1 meal/day. Mucus-like, scant BRB x1, otherwise non-bloody. No melena. Has lost 22# over this time.   Has lowered dose of Progesterone with improvement in Gyn sxs. Scheduled for Gyn with bx for possible history of Endometriosis (cannot find when this diagnosis been established in EMR).   Normal CMP in 1/20. No previous EGD or  colonoscopy.   Family history notable for brother with Crohn's disease, father with PUD requiring partial gastrectomy.   Past medical history, past surgical history, social history, family history, medications, and allergies reviewed in the chart and with patient.    Past Medical History:  Diagnosis Date  . Alopecia   . Anemia      Past Surgical History:  Procedure Laterality Date  . left ankle     Family History  Problem Relation Age of Onset  . Diabetes Mother   . Hyperlipidemia Mother   . Heart attack Mother   . Lung cancer Mother 44  . Uterine cancer Mother   . Cancer Father        prostate  . Graves' disease Father   . Depression Father   . Anxiety disorder Father   . Crohn's disease Brother   . Dementia Maternal Aunt   . Colon cancer Maternal Aunt   . Dementia Maternal Uncle   . Dementia Paternal Grandmother   . Diverticulitis Paternal Grandmother   . Scleroderma Paternal Grandmother   . Bipolar disorder Neg Hx   . Schizophrenia Neg Hx   . OCD Neg Hx   . Alcohol abuse Neg Hx   . Drug abuse Neg Hx    Social History   Tobacco Use  . Smoking status: Former Smoker    Packs/day: 0.30    Types: Cigarettes  . Smokeless tobacco: Never Used  Substance Use Topics  . Alcohol use: No  . Drug use: No    Comment: Caffeine: Coffee 2-3 cupsper day.    Current Outpatient Medications  Medication  Sig Dispense Refill  . albuterol (PROAIR HFA) 108 (90 Base) MCG/ACT inhaler Inhale 2 puffs into the lungs every 6 (six) hours as needed for wheezing or shortness of breath. 1 Inhaler 2  . clobetasol (TEMOVATE) 0.05 % external solution Apply 1 application topically at bedtime. After 2 weeks of use take a break for one week. 50 mL 0  . clonazePAM (KLONOPIN) 0.5 MG tablet Take 1 tablet (0.5 mg total) by mouth 2 (two) times daily as needed for anxiety. 20 tablet 0  . estradiol (VIVELLE-DOT) 0.025 MG/24HR 1 patch onto the skin every Wed and Sat 8 patch 3  . progesterone (PROMETRIUM)  100 MG capsule Take 1 capsule by mouth every evening.    . promethazine (PHENERGAN) 25 MG tablet Take 1 tablet (25 mg total) by mouth every 8 (eight) hours as needed for nausea or vomiting. 20 tablet 0  . traZODone (DESYREL) 100 MG tablet Take 1 tablet (100 mg total) by mouth at bedtime as needed for sleep. Due for follow up visit (Patient taking differently: Take 100 mg by mouth at bedtime. Due for follow up visit) 90 tablet 1  . venlafaxine XR (EFFEXOR-XR) 150 MG 24 hr capsule TAKE 1 CAPSULE (150 MG TOTAL) BY MOUTH DAILY WITH BREAKFAST. 90 capsule 0   No current facility-administered medications for this visit.    Allergies  Allergen Reactions  . Bactrim [Sulfamethoxazole-Trimethoprim] Anaphylaxis  . Ambien [Zolpidem] Other (See Comments)    Emotional Instability.  . Paxil [Paroxetine Hcl] Other (See Comments)    Excess sedation   . Moxifloxacin Rash     Review of Systems: All systems reviewed and negative except where noted in HPI.     Physical Exam:    Physical exam not completed due to the nature of this telehealth communication.  Patient was otherwise alert and oriented and well communicative.   ASSESSMENT AND PLAN;   1) Diarrhea 2) Abnormal CT 3) Nausea/Vomiting 4) Family history of Crohn's Disease 6) possible history of Endometriosis 7) Hematochezia  48 year old female with relatively new onset nausea, vomiting, diarrhea, single episode of BRBPR, with CT demonstrating possible abnormality in the cecum.  Reviewed that CT impression today and with the patient.  Discussed broad DDX of both CT findings and her clinical symptoms, to include polyp/malignancy, Endometriosis, IBD, Microscopic Colitis, along with benign incidental findings on the CT (i.e. stool, physiologic artifact, etc.) and will proceed as below:  - Colonoscopy for evaluation of abnormal CT finding in the cecum - Random and directed biopsies at time of colonoscopy - EGD with random and directed  gastric/duodenal biopsies to evaluate for PUD, gastritis,GOO, evidence of malabsorption -Check CBC and ESR/CRP -Check GI PCR panel -Refill placed for Phenergan -Discussed low FODMAP diet and will send handout via email -Increase dietary fiber  The indications, risks, and benefits of EGD and colonoscopy were explained to the patient in detail. Risks include but are not limited to bleeding, perforation, adverse reaction to medications, and cardiopulmonary compromise. Sequelae include but are not limited to the possibility of surgery, hositalization, and mortality. The patient verbalized understanding and wished to proceed. All questions answered, referred to scheduler and bowel prep ordered. Further recommendations pending results of the exam.    Lavena Bullion, DO, FACG  10/09/2018, 1:16 PM   Hali Marry, *

## 2018-10-09 NOTE — Patient Instructions (Signed)
If you are age 50 or older, your body mass index should be between 23-30. Your Body mass index is 31.95 kg/m. If this is out of the aforementioned range listed, please consider follow up with your Primary Care Provider.  If you are age 75 or younger, your body mass index should be between 19-25. Your Body mass index is 31.95 kg/m. If this is out of the aformentioned range listed, please consider follow up with your Primary Care Provider.   You have been scheduled for an endoscopy and colonoscopy. Please follow the written instructions given to you at your visit today. Please pick up your prep supplies at the pharmacy within the next 1-3 days. If you use inhalers (even only as needed), please bring them with you on the day of your procedure. Your physician has requested that you go to www.startemmi.com and enter the access code given to you at your visit today. This web site gives a general overview about your procedure. However, you should still follow specific instructions given to you by our office regarding your preparation for the procedure.  To help prevent the possible spread of infection to our patients, communities, and staff; we will be implementing the following measures:  As of now we are not allowing any visitors/family members to accompany you to any upcoming appointments with [facility]. If you have any concerns about this please contact our office to discuss prior to the appointment.   We have sent the following medications to your pharmacy for you to pick up at your convenience: Suprep Phenergan  Please go to the lab on the 2nd floor [address] for your scheduled appointment.   It was a pleasure to see you today!  Vito Cirigliano, D.O.

## 2018-10-13 ENCOUNTER — Encounter: Payer: Self-pay | Admitting: Family Medicine

## 2018-10-13 ENCOUNTER — Other Ambulatory Visit (INDEPENDENT_AMBULATORY_CARE_PROVIDER_SITE_OTHER): Payer: BLUE CROSS/BLUE SHIELD

## 2018-10-13 ENCOUNTER — Other Ambulatory Visit: Payer: Self-pay

## 2018-10-13 DIAGNOSIS — Z8379 Family history of other diseases of the digestive system: Secondary | ICD-10-CM | POA: Diagnosis not present

## 2018-10-13 DIAGNOSIS — R112 Nausea with vomiting, unspecified: Secondary | ICD-10-CM | POA: Diagnosis not present

## 2018-10-13 DIAGNOSIS — K921 Melena: Secondary | ICD-10-CM

## 2018-10-13 DIAGNOSIS — R197 Diarrhea, unspecified: Secondary | ICD-10-CM

## 2018-10-13 DIAGNOSIS — R935 Abnormal findings on diagnostic imaging of other abdominal regions, including retroperitoneum: Secondary | ICD-10-CM | POA: Diagnosis not present

## 2018-10-13 LAB — CBC WITH DIFFERENTIAL/PLATELET
Basophils Absolute: 0.1 10*3/uL (ref 0.0–0.1)
Basophils Relative: 0.9 % (ref 0.0–3.0)
Eosinophils Absolute: 0.2 10*3/uL (ref 0.0–0.7)
Eosinophils Relative: 2.6 % (ref 0.0–5.0)
HCT: 46.7 % — ABNORMAL HIGH (ref 36.0–46.0)
Hemoglobin: 16.4 g/dL — ABNORMAL HIGH (ref 12.0–15.0)
Lymphocytes Relative: 31.3 % (ref 12.0–46.0)
Lymphs Abs: 2.2 10*3/uL (ref 0.7–4.0)
MCHC: 35.1 g/dL (ref 30.0–36.0)
MCV: 90.3 fl (ref 78.0–100.0)
Monocytes Absolute: 0.4 10*3/uL (ref 0.1–1.0)
Monocytes Relative: 5.9 % (ref 3.0–12.0)
Neutro Abs: 4.2 10*3/uL (ref 1.4–7.7)
Neutrophils Relative %: 59.3 % (ref 43.0–77.0)
Platelets: 214 10*3/uL (ref 150.0–400.0)
RBC: 5.17 Mil/uL — ABNORMAL HIGH (ref 3.87–5.11)
RDW: 13.4 % (ref 11.5–15.5)
WBC: 7.1 10*3/uL (ref 4.0–10.5)

## 2018-10-13 LAB — C-REACTIVE PROTEIN: CRP: <1 mg/dL (ref 0.5–20.0)

## 2018-10-13 LAB — SEDIMENTATION RATE: Sed Rate: 10 mm/hr (ref 0–30)

## 2018-10-14 ENCOUNTER — Encounter: Payer: Self-pay | Admitting: Family Medicine

## 2018-10-14 ENCOUNTER — Telehealth: Payer: Self-pay | Admitting: Gastroenterology

## 2018-10-14 NOTE — Addendum Note (Signed)
Addended by: Caffie Pinto on: 10/14/2018 02:08 PM   Modules accepted: Orders

## 2018-10-14 NOTE — Telephone Encounter (Signed)
Pt called and waned to speak with the nurse with some questions regarding her upcoming colon/endo.

## 2018-10-14 NOTE — Telephone Encounter (Signed)
The pt instructions were sent to My Chart for the pt to review.

## 2018-10-15 ENCOUNTER — Encounter: Payer: Self-pay | Admitting: Family Medicine

## 2018-10-15 MED ORDER — ALBUTEROL SULFATE HFA 108 (90 BASE) MCG/ACT IN AERS: 2.0000 | INHALATION_SPRAY | Freq: Four times a day (QID) | RESPIRATORY_TRACT | 2 refills | Status: DC | PRN

## 2018-10-16 ENCOUNTER — Other Ambulatory Visit: Payer: Self-pay | Admitting: Family Medicine

## 2018-10-16 LAB — GASTROINTESTINAL PATHOGEN PANEL PCR
C. difficile Tox A/B, PCR: NOT DETECTED
Campylobacter, PCR: NOT DETECTED
Cryptosporidium, PCR: NOT DETECTED
E coli (ETEC) LT/ST PCR: NOT DETECTED
E coli (STEC) stx1/stx2, PCR: NOT DETECTED
E coli 0157, PCR: NOT DETECTED
Giardia lamblia, PCR: NOT DETECTED
Norovirus, PCR: NOT DETECTED
Rotavirus A, PCR: NOT DETECTED
Salmonella, PCR: NOT DETECTED
Shigella, PCR: NOT DETECTED

## 2018-10-19 ENCOUNTER — Telehealth: Payer: Self-pay | Admitting: *Deleted

## 2018-10-19 NOTE — Telephone Encounter (Signed)
Covid-19 travel screening questions  Have you traveled in the last 14 days? No If yes where?  Do you now or have you had a fever in the last 14 days? No  Do you have any respiratory symptoms of shortness of breath or cough now or in the last 14 days? No  Do you have any family members or close contacts with diagnosed or suspected Covid-19? No  Pt aware care partner to wait in the car in parking lot and to wear a mask into the building.

## 2018-10-21 ENCOUNTER — Ambulatory Visit (AMBULATORY_SURGERY_CENTER): Payer: BLUE CROSS/BLUE SHIELD | Admitting: Gastroenterology

## 2018-10-21 ENCOUNTER — Other Ambulatory Visit: Payer: Self-pay

## 2018-10-21 ENCOUNTER — Encounter: Payer: Self-pay | Admitting: Gastroenterology

## 2018-10-21 ENCOUNTER — Other Ambulatory Visit: Payer: Self-pay | Admitting: Family Medicine

## 2018-10-21 VITALS — BP 107/64 | HR 61 | Temp 98.7°F | Resp 22 | Ht 64.0 in | Wt 192.0 lb

## 2018-10-21 DIAGNOSIS — B9681 Helicobacter pylori [H. pylori] as the cause of diseases classified elsewhere: Secondary | ICD-10-CM | POA: Diagnosis not present

## 2018-10-21 DIAGNOSIS — K219 Gastro-esophageal reflux disease without esophagitis: Secondary | ICD-10-CM

## 2018-10-21 DIAGNOSIS — K5730 Diverticulosis of large intestine without perforation or abscess without bleeding: Secondary | ICD-10-CM | POA: Diagnosis not present

## 2018-10-21 DIAGNOSIS — K52832 Lymphocytic colitis: Secondary | ICD-10-CM

## 2018-10-21 DIAGNOSIS — K2970 Gastritis, unspecified, without bleeding: Secondary | ICD-10-CM | POA: Diagnosis not present

## 2018-10-21 DIAGNOSIS — D128 Benign neoplasm of rectum: Secondary | ICD-10-CM

## 2018-10-21 DIAGNOSIS — K2210 Ulcer of esophagus without bleeding: Secondary | ICD-10-CM | POA: Diagnosis not present

## 2018-10-21 DIAGNOSIS — K621 Rectal polyp: Secondary | ICD-10-CM | POA: Diagnosis not present

## 2018-10-21 DIAGNOSIS — R634 Abnormal weight loss: Secondary | ICD-10-CM

## 2018-10-21 DIAGNOSIS — R195 Other fecal abnormalities: Secondary | ICD-10-CM

## 2018-10-21 DIAGNOSIS — R112 Nausea with vomiting, unspecified: Secondary | ICD-10-CM

## 2018-10-21 DIAGNOSIS — R197 Diarrhea, unspecified: Secondary | ICD-10-CM

## 2018-10-21 MED ORDER — LEVALBUTEROL TARTRATE 45 MCG/ACT IN AERO: 2.0000 | INHALATION_SPRAY | RESPIRATORY_TRACT | 1 refills | Status: DC | PRN

## 2018-10-21 MED ORDER — OMEPRAZOLE 20 MG PO CPDR: 20.0000 mg | DELAYED_RELEASE_CAPSULE | Freq: Two times a day (BID) | ORAL | 3 refills | Status: DC

## 2018-10-21 MED ORDER — SODIUM CHLORIDE 0.9 % IV SOLN: 500.0000 mL | INTRAVENOUS | Status: DC

## 2018-10-21 NOTE — Progress Notes (Signed)
PT taken to PACU. Monitors in place. VSS. Report given to RN. 

## 2018-10-21 NOTE — Progress Notes (Signed)
Called to room to assist during endoscopic procedure.  Patient ID and intended procedure confirmed with present staff. Received instructions for my participation in the procedure from the performing physician.  

## 2018-10-21 NOTE — Progress Notes (Signed)
[place], [place], [place]

## 2018-10-21 NOTE — Progress Notes (Signed)
Pro-air not covered by her insurance.  Recommendation by pharmacy was to send over prescription for Xopenex

## 2018-10-21 NOTE — Patient Instructions (Addendum)
Handouts given for Diverticulosis, Polyps, esophagitis and gastritis. Add Fiber (see procedure report) Pick up Rx for prilosec.  YOU HAD AN ENDOSCOPIC PROCEDURE TODAY AT [facility]:   Refer to the procedure report that was given to you for any specific questions about what was found during the examination.  If the procedure report does not answer your questions, please call your gastroenterologist to clarify.  If you requested that your care partner not be given the details of your procedure findings, then the procedure report has been included in a sealed envelope for you to review at your convenience later.  YOU SHOULD EXPECT: Some feelings of bloating in the abdomen. Passage of more gas than usual.  Walking can help get rid of the air that was put into your GI tract during the procedure and reduce the bloating. If you had a lower endoscopy (such as a colonoscopy or flexible sigmoidoscopy) you may notice spotting of blood in your stool or on the toilet paper. If you underwent a bowel prep for your procedure, you may not have a normal bowel movement for a few days.  Please Note:  You might notice some irritation and congestion in your nose or some drainage.  This is from the oxygen used during your procedure.  There is no need for concern and it should clear up in a day or so.  SYMPTOMS TO REPORT IMMEDIATELY:   Following lower endoscopy (colonoscopy or flexible sigmoidoscopy):  Excessive amounts of blood in the stool  Significant tenderness or worsening of abdominal pains  Swelling of the abdomen that is new, acute  Fever of 100F or higher   Following upper endoscopy (EGD)  Vomiting of blood or coffee ground material  New chest pain or pain under the shoulder blades  Painful or persistently difficult swallowing  New shortness of breath  Fever of 100F or higher  Black, tarry-looking stools  For urgent or emergent issues, a gastroenterologist can be reached at any hour  by calling [phone].   DIET:  We do recommend a small meal at first, but then you may proceed to your regular diet.  Drink plenty of fluids but you should avoid alcoholic beverages for 24 hours.  ACTIVITY:  You should plan to take it easy for the rest of today and you should NOT DRIVE or use heavy machinery until tomorrow (because of the sedation medicines used during the test).    FOLLOW UP: Our staff will call the number listed on your records 48-72 hours following your procedure to check on you and address any questions or concerns that you may have regarding the information given to you following your procedure. If we do not reach you, we will leave a message.  We will attempt to reach you two times.  During this call, we will ask if you have developed any symptoms of COVID 19. If you develop any symptoms (ie: fever, flu-like symptoms, shortness of breath, cough etc.) before then, please call [phone].  If you test positive for Covid 19 in the 2 weeks post procedure, please call and report this information to [place].    If any biopsies were taken you will be contacted by phone or by letter within the next 1-3 weeks.  Please call us at [phone] if you have not heard about the biopsies in 3 weeks.    SIGNATURES/CONFIDENTIALITY: You and/or your care partner have signed paperwork which will be entered into your electronic medical record.  These signatures  attest to the fact that that the information above on your After Visit Summary has been reviewed and is understood.  Full responsibility of the confidentiality of this discharge information lies with you and/or your care-partner.

## 2018-10-21 NOTE — Op Note (Signed)
[place] Patient Name: Virginia Logan Procedure Date: 10/21/2018 1:54 PM MRN: [id number] Endoscopist: Gerrit Heck , MD Age: 48 Referring MD:  Date of Birth: [birth year] Gender: Female Account #: [id number] Procedure:                Upper GI endoscopy Indications:              Diarrhea, Nausea with vomiting, Weight loss Medicines:                Monitored Anesthesia Care Procedure:                Pre-Anesthesia Assessment:                           - Prior to the procedure, a History and Physical                            was performed, and patient medications and                            allergies were reviewed. The patient's tolerance of                            previous anesthesia was also reviewed. The risks                            and benefits of the procedure and the sedation                            options and risks were discussed with the patient.                            All questions were answered, and informed consent                            was obtained. Prior Anticoagulants: The patient has                            taken no previous anticoagulant or antiplatelet                            agents. ASA Grade Assessment: II - A patient with                            mild systemic disease. After reviewing the risks                            and benefits, the patient was deemed in                            satisfactory condition to undergo the procedure.                           After obtaining informed consent, the endoscope was  passed under direct vision. Throughout the                            procedure, the patient's blood pressure, pulse, and                            oxygen saturations were monitored continuously. The                            Model GIF-HQ190 [id number]) scope was introduced                            through the mouth, and advanced to the second part                            of  duodenum. The upper GI endoscopy was                            accomplished without difficulty. The patient                            tolerated the procedure well. Scope In: Scope Out: Findings:                 LA Grade A (one or more mucosal breaks less than 5                            mm, not extending between tops of 2 mucosal folds)                            esophagitis with no bleeding was found 40 cm from                            the incisors.                           The upper third of the esophagus and middle third                            of the esophagus were normal.                           The gastroesophageal flap valve was visualized                            endoscopically and classified as Hill Grade II                            (fold present, opens with respiration).                           Diffuse mild inflammation characterized by erythema                            was found in  the gastric body and in the gastric                            antrum. Biopsies were taken with a cold forceps for                            Helicobacter pylori testing. Estimated blood loss                            was minimal.                           Localized mildly erythematous mucosa without active                            bleeding and with no stigmata of bleeding was found                            in the duodenal bulb. Biopsies were taken with a                            cold forceps for histology. Estimated blood loss                            was minimal.                           The first portion of the duodenum and second                            portion of the duodenum were normal. Biopsies for                            histology were taken with a cold forceps for                            evaluation of celiac disease. Estimated blood loss                            was minimal. Complications:            No immediate complications. Estimated Blood Loss:      Estimated blood loss was minimal. Impression:               - LA Grade A esophagitis.                           - Normal upper third of esophagus and middle third                            of esophagus.                           - Gastroesophageal flap valve classified as Hill  Grade II (fold present, opens with respiration).                           - Gastritis. Biopsied.                           - Erythematous duodenopathy. Biopsied.                           - Normal first portion of the duodenum and second                            portion of the duodenum. Biopsied. Recommendation:           - Patient has a contact number available for                            emergencies. The signs and symptoms of potential                            delayed complications were discussed with the                            patient. Return to normal activities tomorrow.                            Written discharge instructions were provided to the                            patient.                           - Resume previous diet today.                           - Continue present medications.                           - Await pathology results.                           - Use Prilosec (omeprazole) 20 mg PO BID for 6                            weeks to promote mucosal healing, then will reduce                            to the lowest effective dose. Gerrit Heck, MD 10/21/2018 2:41:21 PM

## 2018-10-21 NOTE — Op Note (Signed)
[facility] Patient Name: Virginia Logan Procedure Date: 10/21/2018 1:53 PM MRN: [id number] Endoscopist: Gerrit Heck , MD Age: 48 Referring MD:  Date of Birth: August 01, 1970 Gender: Female Account #: [id number] Procedure:                Colonoscopy Indications:              This is the patient's first colonoscopy,                            Hematochezia, Family history of Crohn's disease in                            a first-degree relative (brother), Change in bowel                            habits (mucus-like stools), Diarrhea, Weight loss Medicines:                Monitored Anesthesia Care Procedure:                Pre-Anesthesia Assessment:                           - Prior to the procedure, a History and Physical                            was performed, and patient medications and                            allergies were reviewed. The patient's tolerance of                            previous anesthesia was also reviewed. The risks                            and benefits of the procedure and the sedation                            options and risks were discussed with the patient.                            All questions were answered, and informed consent                            was obtained. Prior Anticoagulants: The patient has                            taken no previous anticoagulant or antiplatelet                            agents. ASA Grade Assessment: II - A patient with                            mild systemic disease. After reviewing the risks  and benefits, the patient was deemed in                            satisfactory condition to undergo the procedure.                           After obtaining informed consent, the colonoscope                            was passed under direct vision. Throughout the                            procedure, the patient's blood pressure, pulse, and                            oxygen  saturations were monitored continuously. The                            Colonoscope was introduced through the anus and                            advanced to the the terminal ileum. The colonoscopy                            was technically difficult and complex due to                            significant looping. Successful completion of the                            procedure was aided by using manual pressure. The                            patient tolerated the procedure well. The quality                            of the bowel preparation was adequate. The terminal                            ileum, ileocecal valve, appendiceal orifice, and                            rectum were photographed. Scope In: 2:11:39 PM Scope Out: 2:33:28 PM Scope Withdrawal Time: 0 hours 12 minutes 30 seconds  Total Procedure Duration: 0 hours 21 minutes 49 seconds  Findings:                 The perianal and digital rectal examinations were                            normal.                           A few small and large-mouthed diverticula were  found in the sigmoid colon, transverse colon and                            ascending colon.                           A diminutive polyp was found in the rectum. The                            polyp was sessile. The polyp was removed with a                            cold biopsy forceps. Resection and retrieval were                            complete. Estimated blood loss was minimal.                           Normal mucosa was otherwise found in the entire                            colon. Biopsies for histology were taken with a                            cold forceps from the right colon and left colon                            for evaluation of microscopic colitis.                           The retroflexed view of the distal rectum and anal                            verge was normal and showed no anal or rectal                             abnormalities.                           The terminal ileum appeared normal. Complications:            No immediate complications. Estimated Blood Loss:     Estimated blood loss was minimal. Impression:               - Diverticulosis in the sigmoid colon, in the                            transverse colon and in the ascending colon.                           - One diminutive polyp in the rectum, removed with                            a cold biopsy forceps. Resected and retrieved.                           -  Normal mucosa in the entire examined colon.                            Biopsied.                           - The distal rectum and anal verge are normal on                            retroflexion view.                           - The examined portion of the ileum was normal. Recommendation:           - Patient has a contact number available for                            emergencies. The signs and symptoms of potential                            delayed complications were discussed with the                            patient. Return to normal activities tomorrow.                            Written discharge instructions were provided to the                            patient.                           - Resume previous diet today.                           - Continue present medications.                           - Await pathology results.                           - Repeat colonoscopy in 7-10 years for surveillance                            based on pathology results.                           - Use fiber, for example Citrucel, Fibercon, Konsyl                            or Metamucil.                           - Return to GI clinic at appointment to be                            scheduled. [employer],  MD 10/21/2018 2:48:01 PM

## 2018-10-22 ENCOUNTER — Telehealth: Payer: Self-pay | Admitting: *Deleted

## 2018-10-22 NOTE — Telephone Encounter (Signed)
Form completed,faxed,confirmation received and scanned into patient's chart..Nykiah Ma Lynetta, CMA  

## 2018-10-23 ENCOUNTER — Telehealth: Payer: Self-pay | Admitting: *Deleted

## 2018-10-23 NOTE — Telephone Encounter (Signed)
  Follow up Call-  Call back number 10/21/2018  Post procedure Call Back phone  # [phone] cell  Permission to leave phone message Yes  Some recent data might be hidden     Patient questions:  Do you have a fever, pain , or abdominal swelling? No. Pain Score  0 *  Have you tolerated food without any problems? Yes.    Have you been able to return to your normal activities? Yes.    Do you have any questions about your discharge instructions: Diet   No. Medications  No. Follow up visit  No.  Do you have questions or concerns about your Care? No.  Actions: * If pain score is 4 or above: No action needed, pain <4.  1. Have you developed a fever since your procedure? no  2.   Have you had an respiratory symptoms (SOB or cough) since your procedure? no  3.   Have you tested positive for COVID 19 since your procedure no  4.   Have you had any family members/close contacts diagnosed with the COVID 19 since your procedure?  no   If any of these questions are a yes, please inquire if patient has been seen by family doctor and route this note to Joylene John, Therapist, sports.

## 2018-10-27 ENCOUNTER — Other Ambulatory Visit: Payer: Self-pay

## 2018-10-27 ENCOUNTER — Encounter: Payer: Self-pay | Admitting: Obstetrics & Gynecology

## 2018-10-27 ENCOUNTER — Encounter: Payer: Self-pay | Admitting: Family Medicine

## 2018-10-27 ENCOUNTER — Ambulatory Visit (INDEPENDENT_AMBULATORY_CARE_PROVIDER_SITE_OTHER): Payer: BLUE CROSS/BLUE SHIELD | Admitting: Obstetrics & Gynecology

## 2018-10-27 VITALS — BP 101/70 | HR 86 | Resp 16 | Ht 64.0 in | Wt 184.0 lb

## 2018-10-27 DIAGNOSIS — N3941 Urge incontinence: Secondary | ICD-10-CM

## 2018-10-27 DIAGNOSIS — R102 Pelvic and perineal pain: Secondary | ICD-10-CM | POA: Diagnosis not present

## 2018-10-27 DIAGNOSIS — N950 Postmenopausal bleeding: Secondary | ICD-10-CM | POA: Diagnosis not present

## 2018-10-27 MED ORDER — MISOPROSTOL 200 MCG PO TABS: ORAL_TABLET | ORAL | 0 refills | Status: DC

## 2018-10-27 MED ORDER — ALPRAZOLAM 0.5 MG PO TABS: ORAL_TABLET | ORAL | 0 refills | Status: DC

## 2018-10-27 NOTE — Progress Notes (Signed)
Post void residual of 2cc

## 2018-10-27 NOTE — Progress Notes (Signed)
Subjective:    Patient ID: Virginia Logan, female    DOB: 1971/05/03, 48 y.o.   MRN: [id number]  HPI  Virginia Logan is a 48 year old female who presents for an episode of postmenopausal bleeding and continued abdominal and lower pelvic pain.  Patient has been seen by Dr. Posey Pronto in [facility] for many years.  She is looking for change in provider to help everything in the [facility] system.  Patient was diagnosed with endometriosis based on symptoms.  She has had painful periods all of her life.  She did conceive and have 1 NSVD 20 years ago.  Patient went into perimenopausal around 2 and has been about 18 months without a period until this past month.  Patient had a very heavy.  That was also painful.  Transvaginal ultrasound held a nabothian cyst, normal ovaries, a normal-sized uterus with a 5 mm lining.  Attempted endometrial biopsy at her PCP.  She was unable to get tissue and the patient was very uncomfortable.  Has been having a lot of GI issues and had an upper endoscopy with biopsies of the small intestine as well as a colonoscopy.  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  Patient was started on loperamide.  She also may be undergoing a work-up for autoimmune disease.  Also recently started a gluten-free diet.  To my knowledge she has not been diagnosed with celiac sprue.  Also having issues with incontinence.  She is to have mild stress incontinence and now she has urge incontinence requiring her to wear a pad that she can leak through.  She will empty her whole bladder at times.  She has very little notice on when her bladder were empty.  She is never been to your urologist nor been on urge incontinence medications.  Patient has been on HRT and is not having hot flashes.  She would like to stop HRT and understands her hot flashes could occur.  Review of Systems  Constitutional: Positive for unexpected weight change.  Respiratory: Negative.   Cardiovascular: Negative.   Gastrointestinal: Positive for  abdominal pain, diarrhea, nausea and vomiting.  Genitourinary: Positive for pelvic pain and vaginal bleeding.       Urge incontinence  Psychiatric/Behavioral: Negative.        Objective:   Physical Exam Vitals signs reviewed.  Constitutional:      General: She is not in acute distress.    Appearance: She is well-developed.  HENT:     Head: Normocephalic and atraumatic.  Eyes:     Conjunctiva/sclera: Conjunctivae normal.  Cardiovascular:     Rate and Rhythm: Normal rate.  Pulmonary:     Effort: Pulmonary effort is normal.  Abdominal:     General: There is no distension.     Palpations: Abdomen is soft. There is no mass.     Tenderness: There is abdominal tenderness. There is no guarding or rebound.     Comments: Tenderness il right upper quadrant  Genitourinary:    Comments: Labia minora with increased melanocytes--pt had this biopsied by Dr.  Posey Pronto and no melanoma Mild atrophy of introitus--non tender Pain across levator ani bilaterally, some increased tone Bimanaul--mobile uterus, mild diffuse tenderness on bimanual Skin:    General: Skin is warm and dry.  Neurological:     Mental Status: She is alert and oriented to person, place, and time.       Assessment & Plan:  48 year old female presents with multiple complaints as follows:  1:  PMB--patient needs endometrial  biopsy with postmenopausal bleeding.  We will premedicate with Cytotec and Xanax and use lidocaine as necessary.  Patient understands she must have someone to drive her for her visit.  Patient also is to stop HRT as she is not having hot flashes and did not have a lot of problems with climacteric symptoms as she entered menopause.  She understands there could be some bleeding if she stops the Prometrium. 2.  Urge incontinence--referral to pelvic physical therapy for bladder retraining.  This will also be therapy for her chronic pelvic pain.  She does have increased tone in her pelvic diaphragm. 3--follow-up  with GI doctor for chronic nausea vomiting diarrhea.

## 2018-10-28 ENCOUNTER — Other Ambulatory Visit: Payer: Self-pay

## 2018-10-28 ENCOUNTER — Ambulatory Visit (INDEPENDENT_AMBULATORY_CARE_PROVIDER_SITE_OTHER): Payer: BC Managed Care – PPO | Admitting: Licensed Clinical Social Worker

## 2018-10-28 DIAGNOSIS — F331 Major depressive disorder, recurrent, moderate: Secondary | ICD-10-CM | POA: Diagnosis not present

## 2018-10-28 DIAGNOSIS — A048 Other specified bacterial intestinal infections: Secondary | ICD-10-CM

## 2018-10-28 DIAGNOSIS — F410 Panic disorder [episodic paroxysmal anxiety] without agoraphobia: Secondary | ICD-10-CM | POA: Diagnosis not present

## 2018-10-28 DIAGNOSIS — F411 Generalized anxiety disorder: Secondary | ICD-10-CM

## 2018-10-28 LAB — URINALYSIS, MICROSCOPIC ONLY
Bacteria, UA: NONE SEEN /HPF
Hyaline Cast: NONE SEEN /LPF

## 2018-10-28 MED ORDER — DOXYCYCLINE HYCLATE 100 MG PO CAPS: 100.0000 mg | ORAL_CAPSULE | Freq: Two times a day (BID) | ORAL | 0 refills | Status: AC

## 2018-10-28 MED ORDER — METRONIDAZOLE 250 MG PO TABS: 250.0000 mg | ORAL_TABLET | Freq: Four times a day (QID) | ORAL | 0 refills | Status: AC

## 2018-10-28 MED ORDER — BISMUTH SUBSALICYLATE 262 MG PO CHEW: 524.0000 mg | CHEWABLE_TABLET | Freq: Four times a day (QID) | ORAL | 0 refills | Status: AC

## 2018-10-28 NOTE — Progress Notes (Signed)
Virtual Visit via Telephone Note  I connected with [name] on 10/28/18 at  8:00 AM EDT by telephone and verified that I am speaking with the correct person using two identifiers.   I discussed the limitations, risks, security and privacy concerns of performing an evaluation and management service by telephone and the availability of in person appointments. I also discussed with the patient that there may be a patient responsible charge related to this service. The patient expressed understanding and agreed to proceed.  THERAPIST PROGRESS NOTE  Session Time: 8:03 AM to 8:58 AM  Participation Level: Active  Behavioral Response: CasualAlertEuthymic  Type of Therapy: Individual Therapy  Treatment Goals addressed:  patient will learn self-management skills for mental health, decrease depression, anxiety, panic, learn coping skills  Interventions: CBT, Solution Focused, Strength-based, Supportive and Other: self-esteem  Summary: Virginia Logan is a 48 y.o. female who presents with yesterday was the first day doesn't have nausea and throwing up. Doctors think she may have autoimmune disease. It may be Celiac or related disease. Went out and bought food that was glutton free after reading test results on my chart. Hasn't talked to gastroenterologist yet Found that uterus lining is too thick, had a biopsy, this area including intestine and bladder are inflammed. Waiting on results for next step.  I have prospects and my outlook is more positive toward this than two weeks ago. "I might live". One thing friends say is that I  might get down but get up and I am better when I get up. Definitely a survivor and know how to get through. "Has been a whirlwind" for past five years. Has not have a stable home life for awhile. It started when my husband  cheated on me with neighbor and friend, splitting up, mom and dad went into hospital, dad passed away, mom got better, things got a little better, a  better. Job was stressful with a lot of traveling. It has been a journey and when is it going to end? I need a little peace.  Anxiety is not too bad, biopsy a couple of weeks ago. That was a bad day. It hurt. Tired of hurting. Freak out over situations. 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 Flipped out before it happened. The unknown and not having a plan and not having an idea is always hard for me. Always anxiety when doesn't know the plan.  Shares yesterday doctor telling her what is going on, talking about it had anxiety. I don't understand why I am so emotional. Discussed natural to have emotions around events happening as she is worried and finding out information about her medical issues.   I have to change my attitude, make it the best days that there are if don't have many left.  Describes some of worries-A lot depends on biopsy. Medical treatment report. July 20 short-term disability ends. Not sure what to do, whether to apply for long-term disability. Needs to get back to work for financial reasons, but also anticipating that job is too stressful. Reviewed expected possibilities including if surgery would be out for 4-6 weeks. Realizing with medical procedures built in experience of anticipating for results, having to focus on moving through one thing at a time, in order to address medical issues.  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 "Moving along." Therapist noted some positive developments and used patient's words "Moving along" to describe handling medical issues. Spoke up with friends. Let her know where she stands. I over extend with people, set  tone and need to change behavior. Fear of not having friends and somebody to love her. Reviewed session and identified working on challenging anticipatory anxiety, recognizing self-esteem is unconditional, that supportive interventions by therapist are helpful.  Suicidal/Homicidal: No  Therapist Response: Therapist assessed patient current  functioning per report and noted some positive change in environmental stressors that help improve mental health symptoms.  Processed patient's feelings related to current stressors. Introduced concepts of CBT that it is not advance that necessarily because of stressors or perception of events.  Explained that we have thoughts that are actually biochemical signals, that we have thousands of them a day and they are not necessarily telling us the truth so we have to pay attention to them and challenge them for accuracy and helpfulness.  Discussed we have thoughts that fall into categories called cognitive distortions and explained specifically for anxiety thought patterns that patient falls into our projections of the future that will turn out negatively and we are not able to handle them.  Discussed challenging these thoughts by recognizing the future has not happened yet so these are merely projections we have made up in her head. Reinforced patient's mechanism of coping of moving through things, pointing out this is a present way of focusing that helps her deal with stressors by dealing with problems happening presently, taking an active role in things that she do about the problem. Describes self-esteem is unconditional self-worth, that patient comes to realize that she has unconditional value and provided positive feedback for reaching out to friends to get her needs met in her friendships. Work with patient on developing narrative that was more hopeful of current events and stressors.  Identified spirituality is an important part of coping Provided strength based and supportive intervention Completed treatment plan.  Plan: Return again in 2 weeks.2.Patient look at website getselfhelp.com to read about CBT strategies for anxiety and depression, particularly topic on automatic thoughts.3.  Therapist continue to work with patient on education on diagnosis and symptoms and learning coping strategies to  decrease anxiety, depression and manage stress.  Diagnosis: Axis I:  major depressive disorder, recurrent, moderate, generalized anxiety disorder, panic disorder    Axis II: No diagnosis  Follow Up Instructions:    I discussed the assessment and treatment plan with the patient. The patient was provided an opportunity to ask questions and all were answered. The patient agreed with the plan and demonstrated an understanding of the instructions.   The patient was advised to call back or seek an in-person evaluation if the symptoms worsen or if the condition fails to improve as anticipated.  I provided  55 minutes of non-face-to-face time during this encounter.    Cordella Register, LCSW 10/28/2018

## 2018-10-28 NOTE — Telephone Encounter (Signed)
Please call her work and see what form they need. I don't think this is the last form we received. This sounds different.

## 2018-10-29 ENCOUNTER — Encounter: Payer: Self-pay | Admitting: Physical Therapy

## 2018-10-29 ENCOUNTER — Telehealth: Payer: Self-pay | Admitting: Gastroenterology

## 2018-10-29 ENCOUNTER — Encounter: Payer: Self-pay | Admitting: Family Medicine

## 2018-10-29 ENCOUNTER — Ambulatory Visit: Payer: BLUE CROSS/BLUE SHIELD | Admitting: Gastroenterology

## 2018-10-29 ENCOUNTER — Ambulatory Visit: Payer: BLUE CROSS/BLUE SHIELD | Admitting: Physical Therapy

## 2018-10-29 NOTE — Telephone Encounter (Signed)
Patient called in requesting to speak with the nurse. She stated that she has some additional questions to ask from the conversation that she had on yesterday. Please return the call thanks.

## 2018-10-29 NOTE — Telephone Encounter (Signed)
Pt called back regarding her results. She is not clear on what she needs to do regarding possible meds causing her colitis.  Notes recorded by Marlon Pel, RN on 10/28/2018 at 2:37 PM EDT Patient notified of the results and recommendations rx sent New lab orders placed for 6 weeks  ------  Notes recorded by Lavena Bullion, DO on 10/27/2018 at 11:39 AM EDT Results from the recent upper endoscopy and colonoscopy are notable for the following: -The biopsies taken from your small intestine were normal and there was no evidence of Celiac Disease.  ------------------------------------------------------------- -The biopsies from the stomach were notable for H. Pylori gastritis, and will plan on treating with quad therapy as below. Please confirm no medication allergies to the prescribed regimen.   1) Omeprazole 20 mg 2 times a day x 14 d 2) Pepto Bismol 2 tabs (262 mg each) 4 times a day x 14 d 3) Metronidazole 250 mg 4 times a day x 14 d 4) doxycycline 100 mg 2 times a day x 14 d  After 4 weeks after treatment completed, check H. Pylori stool antigen to confirm eradication (must be off acid suppression therapy)  --------------------------------------------------------------- -The biopsies from your recent colonoscopy were notable for Microscopic Colitis (Lymphocytic Colitis). This is a chronic inflammatory condition that is typically due to medications but can also be associated with other autoimmune diseases (i.e. diabetes, thyroid disorders, celiac disease, etc.). Treatment of this condition should be as follows  -Review for associated medications, most commonly NSAIDs, PPIs, ranitidine, statins, SSRIs. -If currently smoking, strongly recommend smoking cessation -Start with loperamide for mild diarrhea (<3 stools daily).  -If no improvement with loperamide, will plan to start budesonide       Pt wants Dr. Tracie Harrier to review her meds and see what meds she should try to eliminate. She  states there are quite a few she cannot stop and she will be on PPI for a while with the H pylori tx. Please advise.

## 2018-10-29 NOTE — Telephone Encounter (Signed)
Please see my chart note.  We never actually received page 2 of 3.  We only received page 3 of 3 and that is the one that we completed and have faxed multiple times.  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

## 2018-10-30 ENCOUNTER — Telehealth: Payer: Self-pay | Admitting: *Deleted

## 2018-10-30 NOTE — Telephone Encounter (Signed)
Forms in Dr Gardiner Ramus basket.

## 2018-10-30 NOTE — Telephone Encounter (Signed)
I reviewed her medication list, and the venlafaxine is a possible contributory agent. The PPI prescribed for the H pylori is ok to take in the short term. Will plan to treat the Lymphocytic Colitis as prescribed and in the meantime can discuss with her PCM re: ongoing venlafaxine. I would not stop this agent until able to discuss w/ her PCM. Thanks.

## 2018-10-30 NOTE — Telephone Encounter (Signed)
Form completed,faxed,confirmation received and scanned into patient's chart..Virginia Logan, CMA  

## 2018-10-31 ENCOUNTER — Encounter: Payer: Self-pay | Admitting: Family Medicine

## 2018-11-03 NOTE — Telephone Encounter (Signed)
Done

## 2018-11-03 NOTE — Telephone Encounter (Signed)
Let us just call them and get a new blank form for [place] to complete.  Initially we only received 1 page and we completed it and faxed it back panel received the second page and completed it and fax it back but evidently that is not good enough for the company.  So if we can just get a blank complete form which should be to change as long and I will fill it out again and sign it I will be happy to do so.

## 2018-11-03 NOTE — Telephone Encounter (Signed)
Pt notified via mychart

## 2018-11-04 ENCOUNTER — Ambulatory Visit (INDEPENDENT_AMBULATORY_CARE_PROVIDER_SITE_OTHER): Payer: BC Managed Care – PPO | Admitting: Psychiatry

## 2018-11-04 ENCOUNTER — Ambulatory Visit: Payer: BC Managed Care – PPO | Admitting: Gastroenterology

## 2018-11-04 ENCOUNTER — Encounter (HOSPITAL_COMMUNITY): Payer: Self-pay | Admitting: Psychiatry

## 2018-11-04 DIAGNOSIS — F411 Generalized anxiety disorder: Secondary | ICD-10-CM | POA: Diagnosis not present

## 2018-11-04 DIAGNOSIS — F331 Major depressive disorder, recurrent, moderate: Secondary | ICD-10-CM

## 2018-11-04 DIAGNOSIS — F410 Panic disorder [episodic paroxysmal anxiety] without agoraphobia: Secondary | ICD-10-CM

## 2018-11-04 NOTE — Progress Notes (Signed)
Patient ID: Virginia Logan, female   DOB: 1971-04-23, 48 y.o.   MRN: [id number] [facility]-up Outpatient Visit Tele psych Virginia Logan [id number] 48 y.o.  11/04/2018  Chief Complaint: follow up anxiety History of Present Illness:   Patient returns for Medication Follow up and is diagnosed with Major depressive disorder moderate and generalized anxiety disorder. Panic disorder.  I connected with [name] on 11/04/18 at  3:00 PM EDT by telephone and verified that I am speaking with the correct person using two identifiers.   I discussed the limitations, risks, security and privacy concerns of performing an evaluation and management service by telephone and the availability of in person appointments. I also discussed with the patient that there may be a patient responsible charge related to this service. The patient expressed understanding and agreed to proceed.  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  Last visit buspar was started, she didn't take for long.   More concern of H Pylori and GI symptoms more so during the day.     Insomnia: on trazadone.   Aggravating factor : job stress, Modifying factors; some friends  Severity of depression:worse more so anxiety as of now  Associated symptoms: insomnia at times. No psychosis  Past Medical History:  Diagnosis Date  . Alopecia   . Anemia   . Cancer (HCC)    cervical cancer, melanoma of vulva  . Cervical cancer ([place])   . Colitis   . Depression   . Endometriosis    Family History  Problem Relation Age of Onset  . Diabetes Mother   . Hyperlipidemia Mother   . Heart attack Mother   . Lung cancer Mother 70  . Uterine cancer Mother   . Cancer Father        prostate  . Graves' disease  Father   . Depression Father   . Anxiety disorder Father   . Crohn's disease Brother   . Dementia Maternal Aunt   . Colon cancer Maternal Aunt 62       died at 30  . Dementia Maternal Uncle   . Dementia Paternal Grandmother   . Diverticulitis Paternal Grandmother   . Scleroderma Paternal Grandmother   . Bipolar disorder Neg Hx   . Schizophrenia Neg Hx   . OCD Neg Hx   . Alcohol abuse Neg Hx   . Drug abuse Neg Hx   . Esophageal cancer Neg Hx   . Rectal cancer Neg Hx   . Stomach cancer Neg Hx     Outpatient Encounter Medications as of 11/04/2018  Medication Sig  . ALPRAZolam (XANAX) 0.5 MG tablet Take one tablet one hour prior to procedure and one tablet upon arrival to the office is still having anxiety  . bismuth subsalicylate (PEPTO-BISMOL) 262 MG chewable tablet Chew 2 tablets (524 mg total) by mouth 4 (four) times daily for 14 days.  . clobetasol (TEMOVATE) 0.05 % external solution Apply 1 application topically at bedtime. After 2 weeks of use take a break for one week.  . clonazePAM (KLONOPIN) 0.5 MG tablet Take 1 tablet (0.5 mg total) by mouth 2 (two) times daily as needed for anxiety.  Marland Kitchen doxycycline (VIBRAMYCIN) 100 MG capsule Take 1 capsule (100 mg total) by mouth 2 (two) times  daily for 14 days.  Marland Kitchen estradiol (VIVELLE-DOT) 0.025 MG/24HR 1 PATCH ONTO THE SKIN EVERY WED AND SAT  . levalbuterol (XOPENEX HFA) 45 MCG/ACT inhaler Inhale 2 puffs into the lungs every 4 (four) hours as needed for wheezing or shortness of breath. (Patient not taking: Reported on 10/21/2018)  . metroNIDAZOLE (FLAGYL) 250 MG tablet Take 1 tablet (250 mg total) by mouth 4 (four) times daily for 14 days.  . misoprostol (CYTOTEC) 200 MCG tablet Insert 2 tablets 6-8 hours prior to procedure.  Marland Kitchen omeprazole (PRILOSEC) 20 MG capsule Take 1 capsule (20 mg total) by mouth 2 (two) times daily before a meal. After 6 weeks will reduce dose to the lowest effective dose.  . progesterone (PROMETRIUM) 100 MG capsule Take 1  capsule by mouth every evening.  . promethazine (PHENERGAN) 25 MG tablet Take 1 tablet (25 mg total) by mouth every 8 (eight) hours as needed for nausea or vomiting.  . traZODone (DESYREL) 100 MG tablet Take 1 tablet (100 mg total) by mouth at bedtime as needed for sleep. Due for follow up visit (Patient taking differently: Take 100 mg by mouth at bedtime. Due for follow up visit)  . venlafaxine XR (EFFEXOR-XR) 150 MG 24 hr capsule TAKE 1 CAPSULE (150 MG TOTAL) BY MOUTH DAILY WITH BREAKFAST.   No facility-administered encounter medications on file as of 11/04/2018.     Recent Results (from the past 2160 hour(s))  Urinalysis, Routine w reflex microscopic     Status: Abnormal   Collection Time: 09/23/18  1:45 PM  Result Value Ref Range   Color, Urine ORANGE (A) YELLOW   APPearance CLEAR CLEAR   Specific Gravity, Urine 1.020 1.001 - 1.03   pH 6.5 5.0 - 8.0   Glucose, UA NEGATIVE NEGATIVE   Bilirubin Urine NEGATIVE NEGATIVE   Ketones, ur NEGATIVE NEGATIVE   Hgb urine dipstick 3+ (A) NEGATIVE   Protein, ur 2+ (A) NEGATIVE   Nitrite NEGATIVE NEGATIVE   Leukocytes,Ua TRACE (A) NEGATIVE   WBC, UA NONE SEEN 0 - 5 /HPF   RBC / HPF 20-40 (A) 0 - 2 /HPF   Squamous Epithelial / LPF 0-5 < OR = 5 /HPF   Bacteria, UA NONE SEEN NONE SEEN /HPF   Hyaline Cast NONE SEEN NONE SEEN /LPF  Urine Culture     Status: None   Collection Time: 09/23/18  1:45 PM  Result Value Ref Range   MICRO NUMBER: [id number]    SPECIMEN QUALITY: Adequate    Sample Source URINE    STATUS: FINAL    Result: No Growth   Sed Rate (ESR)     Status: None   Collection Time: 10/13/18  1:04 PM  Result Value Ref Range   Sed Rate 10 0 - 30 mm/hr  C-reactive protein     Status: None   Collection Time: 10/13/18  1:04 PM  Result Value Ref Range   CRP <1.0 0.5 - 20.0 mg/dL  CBC with Differential/Platelet     Status: Abnormal   Collection Time: 10/13/18  1:04 PM  Result Value Ref Range   WBC 7.1 4.0 - 10.5 K/uL   RBC 5.17 (H) 3.87  - 5.11 Mil/uL   Hemoglobin 16.4 (H) 12.0 - 15.0 g/dL   HCT 46.7 (H) 36.0 - 46.0 %   MCV 90.3 78.0 - 100.0 fl   MCHC 35.1 30.0 - 36.0 g/dL   RDW 13.4 11.5 - 15.5 %   Platelets 214.0 150.0 - 400.0 K/uL   Neutrophils  Relative % 59.3 43.0 - 77.0 %   Lymphocytes Relative 31.3 12.0 - 46.0 %   Monocytes Relative 5.9 3.0 - 12.0 %   Eosinophils Relative 2.6 0.0 - 5.0 %   Basophils Relative 0.9 0.0 - 3.0 %   Neutro Abs 4.2 1.4 - 7.7 K/uL   Lymphs Abs 2.2 0.7 - 4.0 K/uL   Monocytes Absolute 0.4 0.1 - 1.0 K/uL   Eosinophils Absolute 0.2 0.0 - 0.7 K/uL   Basophils Absolute 0.1 0.0 - 0.1 K/uL  Gastrointestinal Pathogen Panel PCR     Status: None   Collection Time: 10/14/18  2:08 PM  Result Value Ref Range   Campylobacter, PCR NOT DETECTED NOT DETECT   C. difficile Tox A/B, PCR NOT DETECTED NOT DETECT   E coli 0157, PCR NOT DETECTED NOT DETECT   E coli (ETEC) LT/ST PCR NOT DETECTED NOT DETECT   E coli (STEC) stx1/stx2, PCR NOT DETECTED NOT DETECT   Salmonella, PCR NOT DETECTED NOT DETECT   Shigella, PCR NOT DETECTED NOT DETECT   Norovirus, PCR NOT DETECTED NOT DETECT   Rotavirus A, PCR NOT DETECTED NOT DETECT   Giardia lamblia, PCR NOT DETECTED NOT DETECT   Cryptosporidium, PCR NOT DETECTED NOT DETECT    Comment: . The xTAG(R) Gastrointestinal Pathogen Panel results are presumptive and must be confirmed by FDA-cleared tests or other acceptable reference methods. The results of this test should not be used as the sole basis for diagnosis, treatment, or other patient management decisions. . Performed using the Luminex xTAG(R) Gastrointestinal Pathogen Panel test kit.   Urine Microscopic Only     Status: None   Collection Time: 10/27/18  2:22 PM  Result Value Ref Range   WBC, UA 0-5 0 - 5 /HPF   RBC / HPF 0-2 0 - 2 /HPF   Squamous Epithelial / LPF 0-5 < OR = 5 /HPF   Bacteria, UA NONE SEEN NONE SEEN /HPF   Hyaline Cast NONE SEEN NONE SEEN /LPF    There were no vitals taken for this  visit.   Review of Systems  Constitutional: Negative for fever.  Cardiovascular: Negative for chest pain.  Gastrointestinal: Negative for nausea.  Skin: Negative for rash.  Psychiatric/Behavioral: Negative for substance abuse.    Mental Status Examination  Appearance: casual Alert: Yes Attention: fair  Cooperative: Yes Eye Contact: Speech: normal tone Psychomotor Activity:  Memory/Concentration: adequate Oriented: person, place, time/date and situation Mood:somewhat stressed Affect: Congruent Thought Processes and Associations: Coherent Fund of Knowledge: Fair Thought Content: Suicidal ideation and Homicidal ideation were denied Insight: Fair Judgement: Fair  Diagnosis/ Treatment: Maj. depressive disorder recurrent moderate.  Not worse, continue effexor  Generalized anxiety disorder  Stressed. Not wanting to be taking buspar for now. Continue effexor  May add dose of effexor but will wait till GI appointment and H pylori management or discussion.  Work on Neurosurgeon and therapy   Panic disorder: panic more so related to joining back of work . Continue effexor and therapy , can add more effexor if needed or consider gabapentin Has lost weight as she is walking more    I discussed the assessment and treatment plan with the patient. The patient was provided an opportunity to ask questions and all were answered. The patient agreed with the plan and demonstrated an understanding of the instructions.   The patient was advised to call back or seek an in-person evaluation if the symptoms worsen or if the condition fails to improve as  anticipated.  I provided 65mnutes of non-face-to-face time during this encounter. Fu 4 w or earlier if needed   NMerian Capron MD 11/04/2018

## 2018-11-05 ENCOUNTER — Other Ambulatory Visit: Payer: Self-pay

## 2018-11-05 ENCOUNTER — Telehealth (INDEPENDENT_AMBULATORY_CARE_PROVIDER_SITE_OTHER): Payer: BC Managed Care – PPO | Admitting: Gastroenterology

## 2018-11-05 ENCOUNTER — Encounter: Payer: Self-pay | Admitting: Gastroenterology

## 2018-11-05 VITALS — Ht 65.0 in | Wt 174.0 lb

## 2018-11-05 DIAGNOSIS — R112 Nausea with vomiting, unspecified: Secondary | ICD-10-CM

## 2018-11-05 DIAGNOSIS — B9681 Helicobacter pylori [H. pylori] as the cause of diseases classified elsewhere: Secondary | ICD-10-CM

## 2018-11-05 DIAGNOSIS — K52832 Lymphocytic colitis: Secondary | ICD-10-CM | POA: Diagnosis not present

## 2018-11-05 DIAGNOSIS — F411 Generalized anxiety disorder: Secondary | ICD-10-CM

## 2018-11-05 DIAGNOSIS — R634 Abnormal weight loss: Secondary | ICD-10-CM | POA: Diagnosis not present

## 2018-11-05 DIAGNOSIS — K2970 Gastritis, unspecified, without bleeding: Secondary | ICD-10-CM | POA: Diagnosis not present

## 2018-11-05 NOTE — Progress Notes (Signed)
Chief Complaint: H. pylori gastritis, Lymphocytic Colitis  Referring Provider:     Hali Marry, MD  GI history: 48 year old female initially seen by me in 09/2018 4 routine CRC screening along with evaluation of nausea, vomiting, diarrhea and abnormal CT finding.  1) Nausea/vomiting: nausea daily and vomiting 3-5 days/week since 07/2018.  Decreased appetite/decreased p.o. intake, having 1 meal/day.  EGD with H. pylori gastritis, started on quadruple therapy earlier this month.  2) Diarrhea: Watery stools, 1-3 BM/day.  Mucus-like stools, with scant BRB x1 episode only.  Colonoscopy normal-appearing but biopsies notable for Lymphocytic Colitis, with plan to start loperamide  3) Weight loss: Lost 22#over 8 to 10-week period this year  4) Family history notable for brother with Crohn's Disease, father with PUD requiring partial gastrectomy.  Evaluation to date: - Pelvic ultrasound: Slightly thickened endometrium.  Referred to GYN at [facility] abdomen/pelvis (09/2018): Mild sigmoid diverticulosis with slightly prominent tissue at the tip of the cecum (collapsed bowel versus stool versus mucosal mass) - EGD (09/2018, Dr. Bryan Lemma): LA Grade A esophagitis, Hill grade 2 valve, H. pylori gastritis -Colonoscopy (09/2018, Dr. Bryan Lemma): Diverticulosis, benign diminutive rectal polyp, otherwise normal mucosa, but biopsies notable for Lymphocytic Colitis.  Normal TI - Normal ESR, CRP, CBC, GI PCR panel   HPI:    Due to current restrictions/limitations of in-office visits due to the COVID-19 pandemic, this scheduled clinical appointment was converted to a telehealth virtual consultation using [employer].  -Time of medical discussion: 27 minutes -The patient did consent to this virtual visit and is aware of possible charges through their insurance for this visit.  -Names of all parties present: Virginia Logan (patient), Gerrit Heck, DO, [facility] (physician) -Patient location:  Home -Physician location: Office  Virginia Logan is a 48 y.o. female referred to the Gastroenterology Clinic for routine follow-up.  She was initially seen by me in 09/2018 with multiple GI symptoms as outlined above.  Serologic work-up unrevealing, stool studies negative for infectious etiology.  EGD with H. pylori gastritis, LA Grade A esophagitis; prescribed quadruple therapy.  Colonoscopy with Lymphocytic Colitis; prescribed loperamide.  Today she states she continues to have n/v and diarrhea along with continued wt loss. Also having issues with Anxiety and Depression. Talked to her Psychiatrist yesterday re: Effexor and a possible association with Lymphocytic Colitis.  She has not started the quadruple therapy for H. pylori nor the loperamide for Lymphocytic Colitis.  Continues to have decreased appetite due to her upper GI symptoms.  Past medical history, past surgical history, social history, family history, medications, and allergies reviewed in the chart and with patient.    Past Medical History:  Diagnosis Date  . Alopecia   . Anemia   . Anxiety   . Cancer (HCC)    cervical cancer, melanoma of vulva  . Cervical cancer ([place])   . Colitis   . Depression   . Endometriosis   . GERD (gastroesophageal reflux disease)   . Panic attack      Past Surgical History:  Procedure Laterality Date  . DILATION AND CURETTAGE OF UTERUS    . left ankle    . WISDOM TOOTH EXTRACTION     Family History  Problem Relation Age of Onset  . Diabetes Mother   . Hyperlipidemia Mother   . Heart attack Mother   . Lung cancer Mother 18  . Uterine cancer Mother   . Cancer Father  prostate  . Graves' disease Father   . Depression Father   . Anxiety disorder Father   . Crohn's disease Brother   . Dementia Maternal Aunt   . Colon cancer Maternal Aunt 1       died at 60  . Dementia Maternal Uncle   . Dementia Paternal Grandmother   . Diverticulitis Paternal Grandmother   . Scleroderma  Paternal Grandmother   . Bipolar disorder Neg Hx   . Schizophrenia Neg Hx   . OCD Neg Hx   . Alcohol abuse Neg Hx   . Drug abuse Neg Hx   . Esophageal cancer Neg Hx   . Rectal cancer Neg Hx   . Stomach cancer Neg Hx    Social History   Tobacco Use  . Smoking status: Former Smoker    Packs/day: 0.30    Types: Cigarettes    Last attempt to quit: 08/20/2013    Years since quitting: 5.2  . Smokeless tobacco: Never Used  Substance Use Topics  . Alcohol use: No  . Drug use: No    Comment: Caffeine: Coffee 2-3 cupsper day.    Current Outpatient Medications  Medication Sig Dispense Refill  . estradiol (VIVELLE-DOT) 0.025 MG/24HR 1 PATCH ONTO THE SKIN EVERY WED AND SAT 24 patch 2  . levalbuterol (XOPENEX HFA) 45 MCG/ACT inhaler Inhale 2 puffs into the lungs every 4 (four) hours as needed for wheezing or shortness of breath. 1 Inhaler 1  . omeprazole (PRILOSEC) 20 MG capsule Take 1 capsule (20 mg total) by mouth 2 (two) times daily before a meal. After 6 weeks will reduce dose to the lowest effective dose. 90 capsule 3  . progesterone (PROMETRIUM) 100 MG capsule Take 1 capsule by mouth every evening.    . promethazine (PHENERGAN) 25 MG tablet Take 1 tablet (25 mg total) by mouth every 8 (eight) hours as needed for nausea or vomiting. 30 tablet 3  . traZODone (DESYREL) 100 MG tablet Take 1 tablet (100 mg total) by mouth at bedtime as needed for sleep. Due for follow up visit (Patient taking differently: Take 100 mg by mouth at bedtime. Due for follow up visit) 90 tablet 1  . venlafaxine XR (EFFEXOR-XR) 150 MG 24 hr capsule TAKE 1 CAPSULE (150 MG TOTAL) BY MOUTH DAILY WITH BREAKFAST. 90 capsule 0  . ALPRAZolam (XANAX) 0.5 MG tablet Take one tablet one hour prior to procedure and one tablet upon arrival to the office is still having anxiety (Patient not taking: Reported on 11/05/2018) 2 tablet 0  . bismuth subsalicylate (PEPTO-BISMOL) 262 MG chewable tablet Chew 2 tablets (524 mg total) by mouth 4  (four) times daily for 14 days. (Patient not taking: Reported on 11/05/2018) 112 tablet 0  . clobetasol (TEMOVATE) 0.05 % external solution Apply 1 application topically at bedtime. After 2 weeks of use take a break for one week. (Patient not taking: Reported on 11/05/2018) 50 mL 0  . clonazePAM (KLONOPIN) 0.5 MG tablet Take 1 tablet (0.5 mg total) by mouth 2 (two) times daily as needed for anxiety. (Patient not taking: Reported on 11/05/2018) 20 tablet 0  . doxycycline (VIBRAMYCIN) 100 MG capsule Take 1 capsule (100 mg total) by mouth 2 (two) times daily for 14 days. (Patient not taking: Reported on 11/05/2018) 28 capsule 0  . metroNIDAZOLE (FLAGYL) 250 MG tablet Take 1 tablet (250 mg total) by mouth 4 (four) times daily for 14 days. (Patient not taking: Reported on 11/05/2018) 56 tablet 0  . misoprostol (  CYTOTEC) 200 MCG tablet Insert 2 tablets 6-8 hours prior to procedure. (Patient not taking: Reported on 11/05/2018) 2 tablet 0   No current facility-administered medications for this visit.    Allergies  Allergen Reactions  . Bactrim [Sulfamethoxazole-Trimethoprim] Anaphylaxis  . Ambien [Zolpidem] Other (See Comments)    Emotional Instability.  . Paxil [Paroxetine Hcl] Other (See Comments)    Excess sedation   . Moxifloxacin Rash     Review of Systems: All systems reviewed and negative except where noted in HPI.     Physical Exam:    Complete physical exam not completed due to the nature of this telehealth communication.   Gen: Awake, alert, and oriented, and well communicative. HEENT: EOMI, non-icteric sclera, NCAT, MMM Neck: Normal movement of head and neck Pulm: No labored breathing, speaking in full sentences without conversational dyspnea Derm: No apparent lesions or bruising in visible field MS: Moves all visible extremities without noticeable abnormality Psych: Pleasant, cooperative, normal speech, thought processing seemingly intact   ASSESSMENT AND PLAN;   1) H. pylori  Gastritis: -Start quadruple therapy as prescribed - H. pylori stool study to confirm eradication 14 days after completion of therapy - Stop PPI when H. pylori therapy complete, so not to complicate the Lymphocytic Colitis  2) Lymphocytic Colitis: -Discussed the pathophysiology of Microscopic Colitis/Lymphocytic Colitis today.  She is a non-smoker, does not take any NSAIDs, and is largely without any typical provocative medications (i.e., statin, NSAIDs, PPI aside from the newly prescribed omeprazole, SSRI, etc.).  As an SNRI, the Effexor is not generally ascribed to Microscopic Colitis, and therefore do not feel that she needs to stop this at this time.  -Start loperamide -Reviewed medications as above -If no appreciable improvement with adequate course of loperamide, plan to use budesonide  3) Weight loss: Weight loss seems to be related to decreased appetite and poor p.o. intake, which certainly could be due to the above H. pylori gastritis.  Could be component of anxiety overlay as well.  Plan to observe for clinical improvement with the above treatment.    -Referral to Nutrition Clinic -If ongoing weight loss despite treatment of the above, plan for VCE versus CT enterography for further small bowel interrogation - Additional micronutrient evaluation and prealbumin pending response to therapy  4) Nausea/vomiting: -Observe for clinical improvement with treatment of H pylori -Antiemetics prn okay -Continue to intake as tolerated.  Recommend small, frequent meals with low-fat low fiber foods  RTC in 3 months or sooner as needed  I spent a total of 25 minutes of face-to-face time with the patient. Greater than 50% of the time was spent counseling and coordinating care.      Lavena Bullion, DO, FACG  11/05/2018, 10:35 AM   Hali Marry, *

## 2018-11-05 NOTE — Patient Instructions (Signed)
If you are age 50 or older, your body mass index should be between 23-30. Your Body mass index is 28.96 kg/m. If this is out of the aforementioned range listed, please consider follow up with your Primary Care Provider.  If you are age 68 or younger, your body mass index should be between 19-25. Your Body mass index is 28.96 kg/m. If this is out of the aformentioned range listed, please consider follow up with your Primary Care Provider.   To help prevent the possible spread of infection to our patients, communities, and staff; we will be implementing the following measures:  As of now we are not allowing any visitors/family members to accompany you to any upcoming appointments with [facility]. If you have any concerns about this please contact our office to discuss prior to the appointment.   We have sent a referral to Nutrition regarding weight loss. You will be receiving a call to schedule an appointment with them soon.  It was a pleasure to see you today!  Vito Cirigliano, D.O.

## 2018-11-13 ENCOUNTER — Ambulatory Visit (INDEPENDENT_AMBULATORY_CARE_PROVIDER_SITE_OTHER): Payer: BC Managed Care – PPO | Admitting: Licensed Clinical Social Worker

## 2018-11-13 DIAGNOSIS — F331 Major depressive disorder, recurrent, moderate: Secondary | ICD-10-CM

## 2018-11-13 DIAGNOSIS — F410 Panic disorder [episodic paroxysmal anxiety] without agoraphobia: Secondary | ICD-10-CM | POA: Diagnosis not present

## 2018-11-13 DIAGNOSIS — F411 Generalized anxiety disorder: Secondary | ICD-10-CM | POA: Diagnosis not present
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
## 2018-11-17 ENCOUNTER — Other Ambulatory Visit (HOSPITAL_COMMUNITY)
Admission: RE | Admit: 2018-11-17 | Discharge: 2018-11-17 | Disposition: A | Discharge status: Home / Self Care | Payer: BC Managed Care – PPO | Source: Ambulatory Visit | Attending: Family Medicine | Admitting: Family Medicine

## 2018-11-17 ENCOUNTER — Ambulatory Visit (INDEPENDENT_AMBULATORY_CARE_PROVIDER_SITE_OTHER): Payer: BC Managed Care – PPO | Admitting: Family Medicine

## 2018-11-17 ENCOUNTER — Encounter: Payer: Self-pay | Admitting: Family Medicine

## 2018-11-17 ENCOUNTER — Other Ambulatory Visit: Payer: Self-pay

## 2018-11-17 VITALS — BP 109/76 | HR 85 | Ht 65.0 in | Wt 184.0 lb

## 2018-11-17 DIAGNOSIS — N950 Postmenopausal bleeding: Secondary | ICD-10-CM | POA: Diagnosis not present

## 2018-11-17 DIAGNOSIS — Z3202 Encounter for pregnancy test, result negative: Secondary | ICD-10-CM | POA: Diagnosis not present

## 2018-11-17 DIAGNOSIS — Z01812 Encounter for preprocedural laboratory examination: Secondary | ICD-10-CM

## 2018-11-17 LAB — POCT URINE PREGNANCY: Preg Test, Ur: NEGATIVE

## 2018-11-17 NOTE — Progress Notes (Signed)
   Subjective:    Patient ID: Virginia Logan is a 48 y.o. female presenting with Gynecologic Exam (ENDOMETRIAL BIOPSY)  on 11/17/2018  HPI: Here today for EMB. Has episode of PMB, on HRT with dose change. Failed in office with PCP. Has taken cytotec today.  Review of Systems  Constitutional: Negative for chills and fever.  Respiratory: Negative for shortness of breath.   Cardiovascular: Negative for chest pain.  Gastrointestinal: Negative for abdominal pain, nausea and vomiting.  Genitourinary: Negative for dysuria.  Skin: Negative for rash.      Objective:    BP 109/76   Pulse 85   Ht 5\' 5"  (1.651 m)   Wt 184 lb (83.5 kg)   LMP 09/26/2018   BMI 30.62 kg/m  Physical Exam Constitutional:      General: She is not in acute distress.    Appearance: She is well-developed.  HENT:     Head: Normocephalic and atraumatic.  Eyes:     General: No scleral icterus. Neck:     Musculoskeletal: Neck supple.  Cardiovascular:     Rate and Rhythm: Normal rate.  Pulmonary:     Effort: Pulmonary effort is normal.  Abdominal:     Palpations: Abdomen is soft.  Skin:    General: Skin is warm and dry.  Neurological:     Mental Status: She is alert and oriented to person, place, and time.    ENDOMETRIAL BIOPSY     The indications for endometrial biopsy were reviewed.   Risks of the biopsy including cramping, bleeding, infection, uterine perforation, inadequate specimen and need for additional procedures  were discussed. The patient states she understands and agrees to undergo procedure today. Consent was signed. Time out was performed. Urine HCG was negative. During the pelvic exam, the cervix was prepped with Betadine. A single-toothed tenaculum was placed on the anterior lip of the cervix to stabilize it. An os finder was used to dilate the cervix. The 3 mm pipelle was introduced into the endometrial cavity without difficulty to a depth of 7cm, and a small amount of tissue was obtained and  sent to pathology. The instruments were removed from the patient's vagina. Minimal bleeding from the cervix was noted. The patient tolerated the procedure well. Routine post-procedure instructions were given to the patient.       Assessment & Plan:   Problem List Items Addressed This Visit      Unprioritized   Post-menopausal bleeding    S/p EMB today--will f/u results.      Relevant Orders   Surgical pathology( [place]/ [facility])    Other Visit Diagnoses    Encounter for preprocedural laboratory examination    -  Primary   Relevant Orders   POCT urine pregnancy (Completed)      Total face-to-face time with patient: 10 minutes. Over 50% of encounter was spent on counseling and coordination of care. Return in about 4 weeks (around 12/15/2018) for a follow-up.  Donnamae Jude 11/17/2018 2:34 PM

## 2018-11-17 NOTE — Assessment & Plan Note (Signed)
S/p EMB today--will f/u results.

## 2018-11-17 NOTE — Progress Notes (Signed)
Here today for Endometrial biopsy.  She was recently diagnosed with Hpylori and colititis.  She is on two antibiotics, one she cannot remember the name.   She was not able to take her Xanax preprocedure due to her ride did not show.

## 2018-11-17 NOTE — Patient Instructions (Signed)

## 2018-12-04 ENCOUNTER — Ambulatory Visit (INDEPENDENT_AMBULATORY_CARE_PROVIDER_SITE_OTHER): Payer: BC Managed Care – PPO | Admitting: Licensed Clinical Social Worker

## 2018-12-04 DIAGNOSIS — F331 Major depressive disorder, recurrent, moderate: Secondary | ICD-10-CM

## 2018-12-04 DIAGNOSIS — F411 Generalized anxiety disorder: Secondary | ICD-10-CM

## 2018-12-04 DIAGNOSIS — F410 Panic disorder [episodic paroxysmal anxiety] without agoraphobia: Secondary | ICD-10-CM | POA: Diagnosis not present

## 2018-12-04 NOTE — Progress Notes (Signed)
Virtual Visit via Telephone Note  I connected with Virginia Logan on 12/04/18 at  8:00 AM EDT by telephone and verified that I am speaking with the correct person using two identifiers.   I discussed the limitations, risks, security and privacy concerns of performing an evaluation and management service by telephone and the availability of in person appointments. I also discussed with the patient that there may be a patient responsible charge related to this service. The patient expressed understanding and agreed to proceed.  Follow Up Instructions:    I discussed the assessment and treatment plan with the patient. The patient was provided an opportunity to ask questions and all were answered. The patient agreed with the plan and demonstrated an understanding of the instructions.   The patient was advised to call back or seek an in-person evaluation if the symptoms worsen or if the condition fails to improve as anticipated.  I provided 56 minutes of non-face-to-face time during this encounter.   THERAPIST PROGRESS NOTE  Session Time: 8:01 AM to 8:57 AM  Participation Level: Active  Behavioral Response: CasualAlertAnxious and Dysphoric  Type of Therapy: Individual Therapy  Treatment Goals addressed:  patient will learn self-management skills for mental health, decrease depression, anxiety, panic, learn coping skills  Interventions: CBT, Solution Focused, Strength-based, Supportive, Reframing and Other: mindfulness, coping  Summary: Virginia Logan is a 48 y.o. female who presents with does not have uterine cancer and that is a relief. Describes motivational problem and doesn't feel like doing anything. Decided not to go for social security but to "get back in the game"  motivation problem, back in the game.  Shares going back to the situation that is overwhelming, does not have confidence that she can do the job, feels already behind when she goes back, concerns of traveling having a  panic attack being a reason to be on meds.  Patient shares she thinks she is being realistic as she has been there over a year.  She will start looking for something more suitable for where I am in life, less stress and less travel .  Need to simplify  Therapist reviewed attitudes and strategies that will help her cope with the stressful environment.  Pointed out cognitive distortions that need to be challenged including projecting negatively into the future.  Patient recognizes that she does this, needs to live in the moment but asked how?  Realizes she asked herself what is the point debates herself even before she gets started.  Reviewed self reflection skills (see below ) to help her and management of thoughts, (challenging thoughts.   Reviewed pression tied to lack of motivation and patient agrees mom passed in March, still crying days.  Reviewed thought that leads to depression and includes asking herself is the best I can do with my life.,  Not being financially secure, having to deal with mom's house, where can I live in it is all overwhelming.  Is anticipating the worst.  Reviewed patient anticipating the worst, looking at evidence to provide a more realistic expectation.  Lots of [place] not knowing what the future will bring, staying focused on the present and doing the best we can rch crying some days. Depressed and this is the best I can do with my life. Being alone. Not being financially secure. Having to deal with mom's house, asking where I can live?[place]  Therapist challenged thoughts by having realistic perspective of people and challenges they face as well, recognizing living life doing the best  to manage challenges. Looking at how her situation might not be so bad, focusing on steps to make it the best she can and create it the way she wants taking things a step at a time.     Suicidal/Homicidal: No  Therapist Response: Therapist assessed patient current functioning and processed patient's feelings  related to current stressors.  Work with patient on challenging cognitive distortions of negative projections into the future, pointing out that they are not real as they have not happened, also recognizing she is automatically going to worse case scenario even though it has not happened.  Reviewed stressors of job that she is going back to, discussed coping strategies to deal with stressful environment.  Discussed helpful attitudes such as do the best that you can and this is all she can do to be okay with this.  Also recognizing situation with a lot of uncertainty, overwhelming having perspective of situation helps and adapting.  Also encouraging attitude of seeing things in perspective where we do not over exaggerate the importance of things that increased stress.  Reviewed plan also includes respecting what is best for her to meet her needs so it includes looking for other types of jobs where she would be happier. Reviewed motivation is indication of depression, the key to motivation is to start an activity and that can help build motivation.  Specially with depression you want to start off small, maybe do something for 5 minutes and see if you are able to continue.  Reviewed behavior activation putting activities into her day that she enjoys, treatment intervention of doing activities as helpful to decrease depression. Reviewed mindfulness as a stage of nonjudgmental awareness was happening in the present moment, including awareness of one's own thoughts feelings and senses.  Introduced practices of mindfulness to help patient and more observation of thoughts that will help her in gaining control of thoughts. Continue to challenge patient on her underestimation of her skills and strengths.  Provided strength based and supportive intervention  Plan: Return again in 2-3 weeks.2.  Therapist will send patient explanation of mindfulness to help patient build skills around awareness of thoughts and emotions for  self-regulation skills to help decrease anxiety.  Therapist send 1 practice exercise for mindfulness.  Please send information on behavior activation so that patient sets up schedule where she complete simple activities during the day to help decrease depression. 3.  Patient will review applications for mindfulness and begin to work on changing negative patterns of thought. 4.  Therapist continue to work on strategies to decrease anxiety and depression 5.Patient review video "Why Mindfulness is A Super Hero"  Diagnosis: Axis I:  major depressive disorder, recurrent, moderate, generalized anxiety disorder, panic disorder    Axis II: No diagnosis    Cordella Register, LCSW 12/04/2018

## 2018-12-08 ENCOUNTER — Encounter: Payer: Self-pay | Admitting: Obstetrics & Gynecology

## 2018-12-08 ENCOUNTER — Other Ambulatory Visit: Payer: Self-pay

## 2018-12-08 ENCOUNTER — Ambulatory Visit (INDEPENDENT_AMBULATORY_CARE_PROVIDER_SITE_OTHER): Payer: BC Managed Care – PPO | Admitting: Obstetrics & Gynecology

## 2018-12-08 VITALS — Resp 16 | Ht 65.0 in | Wt 180.0 lb

## 2018-12-08 DIAGNOSIS — Z01419 Encounter for gynecological examination (general) (routine) without abnormal findings: Secondary | ICD-10-CM

## 2018-12-08 DIAGNOSIS — N951 Menopausal and female climacteric states: Secondary | ICD-10-CM | POA: Diagnosis not present

## 2018-12-08 DIAGNOSIS — N3941 Urge incontinence: Secondary | ICD-10-CM | POA: Diagnosis not present

## 2018-12-08 NOTE — Progress Notes (Signed)
   Subjective:    Patient ID: Virginia Logan, female    DOB: 29-May-1970, 48 y.o.   MRN: [id number]  HPI  48 yo female presents for f/u of PMB.  Benign endometrial biopsy.  Pt stil on HRT>  She is interested in stopping b/c she does not want the increased risk of breast cancer.  Pt has colitis and being treated.  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    Review of Systems  Constitutional: Negative.   Respiratory: Negative.   Cardiovascular: Negative.   Genitourinary: Positive for pelvic pain. Negative for vaginal bleeding.  Psychiatric/Behavioral: Negative.        Objective:   Physical Exam Vitals signs reviewed.  Constitutional:      General: She is not in acute distress.    Appearance: She is well-developed.  HENT:     Head: Normocephalic and atraumatic.  Eyes:     Conjunctiva/sclera: Conjunctivae normal.  Cardiovascular:     Rate and Rhythm: Normal rate.  Pulmonary:     Effort: Pulmonary effort is normal.  Skin:    General: Skin is warm and dry.  Neurological:     Mental Status: She is alert and oriented to person, place, and time.     Assessment & Plan:  48 yo female with PMB now resolved.  1.  Benign Bx. 2.  Counseling on HRT and pt elects to stop.  She is aware that she could have spotting when she stops.  This would not need a work up.  If she finds the lethargy and mental fog unbearable, she can restart.   3.  Mammogram 4.  Pelvic PT for bladder retraining.    25 minutes spent face to face with greater than 50% counseling.

## 2018-12-09 ENCOUNTER — Telehealth: Payer: Self-pay

## 2018-12-09 NOTE — Telephone Encounter (Signed)
-----   Message from Marlon Pel, RN sent at 10/28/2018  2:38 PM EDT -----  ----- Message ----- From: Marlon Pel, RN Sent: 10/28/2018   2:37 PM EDT To: Mohammed Kindle, RN  Needs h pylori stool antigen after completing antibiotics- [place]

## 2018-12-09 NOTE — Telephone Encounter (Signed)
Called and spoke with patient-informed patient of need for recheck for H. Pylori-patient verbalized understanding of information/instructions; patient advised to call back to the office should questions/concerns arise;

## 2018-12-12 ENCOUNTER — Ambulatory Visit (INDEPENDENT_AMBULATORY_CARE_PROVIDER_SITE_OTHER): Payer: BC Managed Care – PPO | Admitting: Psychiatry

## 2018-12-12 ENCOUNTER — Encounter (HOSPITAL_COMMUNITY): Payer: Self-pay | Admitting: Psychiatry

## 2018-12-12 DIAGNOSIS — F411 Generalized anxiety disorder: Secondary | ICD-10-CM | POA: Diagnosis not present

## 2018-12-12 DIAGNOSIS — F410 Panic disorder [episodic paroxysmal anxiety] without agoraphobia: Secondary | ICD-10-CM

## 2018-12-12 DIAGNOSIS — F331 Major depressive disorder, recurrent, moderate: Secondary | ICD-10-CM

## 2018-12-12 MED ORDER — VENLAFAXINE HCL ER 150 MG PO CP24: 150.0000 mg | ORAL_CAPSULE | Freq: Every day | ORAL | 0 refills | Status: DC

## 2018-12-12 MED ORDER — TRAZODONE HCL 100 MG PO TABS: 100.0000 mg | ORAL_TABLET | Freq: Every evening | ORAL | 1 refills | Status: DC | PRN

## 2018-12-12 NOTE — Progress Notes (Signed)
Patient ID: Virginia Logan, female   DOB: 02-14-1971, 48 y.o.   MRN: [id number] [place] Follow-up Outpatient Visit Tele psych Virginia Logan [id number] 48 y.o.  12/12/2018  Chief Complaint: follow up anxiety History of Present Illness:   Patient returns for Medication Follow up and is diagnosed with Major depressive disorder moderate and generalized anxiety disorder. Panic disorder.   I connected with Virginia Logan on 12/12/18 at 10:30 AM EDT by telephone and verified that I am speaking with the correct person using two identifiers.  I discussed the limitations, risks, security and privacy concerns of performing an evaluation and management service by telephone and the availability of in person appointments. I also discussed with the patient that there may be a patient responsible charge related to this service. The patient expressed understanding and agreed to proceed.  Colonoscopy came back negative Therapy helping Mood is better, also on hormonal therap   Planning to go back work Monday, less panic   Insomnia: on trazadone.   Aggravating factor : job stress, Modifying factors; some friends  Severity of depression:worse more so anxiety as of now  Associated symptoms: insomnia at times. No psychosis  Past Medical History:  Diagnosis Date  . Alopecia   . Anemia   . Anxiety   . Cancer (HCC)    cervical cancer, melanoma of vulva  . Cervical cancer ([place])   . Colitis   . Depression   . Endometriosis   . GERD (gastroesophageal reflux disease)   . Panic attack    Family History  Problem Relation Age of Onset  . Diabetes Mother   . Hyperlipidemia Mother   . Heart attack Mother   . Lung cancer Mother 69  . Uterine cancer Mother   . Cancer Father        prostate  . Graves' disease Father   . Depression Father   . Anxiety disorder Father   . Crohn's disease Brother   . Dementia Maternal Aunt   . Colon cancer Maternal Aunt 40       died at 68  .  Dementia Maternal Uncle   . Dementia Paternal Grandmother   . Diverticulitis Paternal Grandmother   . Scleroderma Paternal Grandmother   . Bipolar disorder Neg Hx   . Schizophrenia Neg Hx   . OCD Neg Hx   . Alcohol abuse Neg Hx   . Drug abuse Neg Hx   . Esophageal cancer Neg Hx   . Rectal cancer Neg Hx   . Stomach cancer Neg Hx     Outpatient Encounter Medications as of 12/12/2018  Medication Sig  . clobetasol (TEMOVATE) 0.05 % external solution Apply 1 application topically at bedtime. After 2 weeks of use take a break for one week.  . clonazePAM (KLONOPIN) 0.5 MG tablet Take 1 tablet (0.5 mg total) by mouth 2 (two) times daily as needed for anxiety.  Marland Kitchen levalbuterol (XOPENEX HFA) 45 MCG/ACT inhaler Inhale 2 puffs into the lungs every 4 (four) hours as needed for wheezing or shortness of breath.  Marland Kitchen omeprazole (PRILOSEC) 20 MG capsule Take 1 capsule (20 mg total) by mouth 2 (two) times daily before a meal. After 6 weeks will reduce dose to the lowest effective dose.  . promethazine (PHENERGAN) 25 MG tablet Take 1 tablet (25 mg total) by mouth every 8 (eight) hours as needed for nausea or vomiting.  . traZODone (DESYREL) 100 MG tablet Take 1 tablet (100 mg total) by mouth at bedtime as needed for sleep.  Due for follow up visit  . venlafaxine XR (EFFEXOR-XR) 150 MG 24 hr capsule Take 1 capsule (150 mg total) by mouth daily with breakfast.  . [DISCONTINUED] traZODone (DESYREL) 100 MG tablet Take 1 tablet (100 mg total) by mouth at bedtime as needed for sleep. Due for follow up visit (Patient taking differently: Take 100 mg by mouth at bedtime. Due for follow up visit)  . [DISCONTINUED] venlafaxine XR (EFFEXOR-XR) 150 MG 24 hr capsule TAKE 1 CAPSULE (150 MG TOTAL) BY MOUTH DAILY WITH BREAKFAST.   No facility-administered encounter medications on file as of 12/12/2018.     Recent Results (from the past 2160 hour(s))  Urinalysis, Routine w reflex microscopic     Status: Abnormal   Collection  Time: 09/23/18  1:45 PM  Result Value Ref Range   Color, Urine ORANGE (A) YELLOW   APPearance CLEAR CLEAR   Specific Gravity, Urine 1.020 1.001 - 1.03   pH 6.5 5.0 - 8.0   Glucose, UA NEGATIVE NEGATIVE   Bilirubin Urine NEGATIVE NEGATIVE   Ketones, ur NEGATIVE NEGATIVE   Hgb urine dipstick 3+ (A) NEGATIVE   Protein, ur 2+ (A) NEGATIVE   Nitrite NEGATIVE NEGATIVE   Leukocytes,Ua TRACE (A) NEGATIVE   WBC, UA NONE SEEN 0 - 5 /HPF   RBC / HPF 20-40 (A) 0 - 2 /HPF   Squamous Epithelial / LPF 0-5 < OR = 5 /HPF   Bacteria, UA NONE SEEN NONE SEEN /HPF   Hyaline Cast NONE SEEN NONE SEEN /LPF  Urine Culture     Status: None   Collection Time: 09/23/18  1:45 PM   Specimen: Urine  Result Value Ref Range   MICRO NUMBER: [id number]    SPECIMEN QUALITY: Adequate    Sample Source URINE    STATUS: FINAL    Result: No Growth   Sed Rate (ESR)     Status: None   Collection Time: 10/13/18  1:04 PM  Result Value Ref Range   Sed Rate 10 0 - 30 mm/hr  C-reactive protein     Status: None   Collection Time: 10/13/18  1:04 PM  Result Value Ref Range   CRP <1.0 0.5 - 20.0 mg/dL  CBC with Differential/Platelet     Status: Abnormal   Collection Time: 10/13/18  1:04 PM  Result Value Ref Range   WBC 7.1 4.0 - 10.5 K/uL   RBC 5.17 (H) 3.87 - 5.11 Mil/uL   Hemoglobin 16.4 (H) 12.0 - 15.0 g/dL   HCT 46.7 (H) 36.0 - 46.0 %   MCV 90.3 78.0 - 100.0 fl   MCHC 35.1 30.0 - 36.0 g/dL   RDW 13.4 11.5 - 15.5 %   Platelets 214.0 150.0 - 400.0 K/uL   Neutrophils Relative % 59.3 43.0 - 77.0 %   Lymphocytes Relative 31.3 12.0 - 46.0 %   Monocytes Relative 5.9 3.0 - 12.0 %   Eosinophils Relative 2.6 0.0 - 5.0 %   Basophils Relative 0.9 0.0 - 3.0 %   Neutro Abs 4.2 1.4 - 7.7 K/uL   Lymphs Abs 2.2 0.7 - 4.0 K/uL   Monocytes Absolute 0.4 0.1 - 1.0 K/uL   Eosinophils Absolute 0.2 0.0 - 0.7 K/uL   Basophils Absolute 0.1 0.0 - 0.1 K/uL  Gastrointestinal Pathogen Panel PCR     Status: None   Collection Time: 10/14/18   2:08 PM  Result Value Ref Range   Campylobacter, PCR NOT DETECTED NOT DETECT   C. difficile Tox A/B, PCR NOT  DETECTED NOT DETECT   E coli 0157, PCR NOT DETECTED NOT DETECT   E coli (ETEC) LT/ST PCR NOT DETECTED NOT DETECT   E coli (STEC) stx1/stx2, PCR NOT DETECTED NOT DETECT   Salmonella, PCR NOT DETECTED NOT DETECT   Shigella, PCR NOT DETECTED NOT DETECT   Norovirus, PCR NOT DETECTED NOT DETECT   Rotavirus A, PCR NOT DETECTED NOT DETECT   Giardia lamblia, PCR NOT DETECTED NOT DETECT   Cryptosporidium, PCR NOT DETECTED NOT DETECT    Comment: . The xTAG(R) Gastrointestinal Pathogen Panel results are presumptive and must be confirmed by FDA-cleared tests or other acceptable reference methods. The results of this test should not be used as the sole basis for diagnosis, treatment, or other patient management decisions. . Performed using the [employer] xTAG(R) Gastrointestinal Pathogen Panel test kit.   Urine Microscopic Only     Status: None   Collection Time: 10/27/18  2:22 PM  Result Value Ref Range   WBC, UA 0-5 0 - 5 /HPF   RBC / HPF 0-2 0 - 2 /HPF   Squamous Epithelial / LPF 0-5 < OR = 5 /HPF   Bacteria, UA NONE SEEN NONE SEEN /HPF   Hyaline Cast NONE SEEN NONE SEEN /LPF  POCT urine pregnancy     Status: Normal   Collection Time: 11/17/18 10:41 AM  Result Value Ref Range   Preg Test, Ur Negative Negative    There were no vitals taken for this visit.   Review of Systems  Cardiovascular: Negative for chest pain.  Skin: Negative for rash.  Psychiatric/Behavioral: Negative for substance abuse.    Mental Status Examination  Appearance: casual Alert: Yes Attention: fair  Cooperative: Yes Eye Contact: Speech: normal tone Psychomotor Activity:  Memory/Concentration: adequate Oriented: person, place, time/date and situation Mood:better Affect: Congruent Thought Processes and Associations: Coherent Fund of Knowledge: Fair Thought Content: Suicidal ideation and  Homicidal ideation were denied Insight: Fair Judgement: Fair  Diagnosis/ Treatment: Maj. depressive disorder recurrent moderate. Better. Continue effexor   Generalized anxiety disorder better. Continue effexor Insomnia: doing fair on trazadone. continue Work on Neurosurgeon and therapy   Panic disorder: panic improved, planning to go back work Monday Has lost weight as she is walking more    I discussed the assessment and treatment plan with the patient. The patient was provided an opportunity to ask questions and all were answered. The patient agreed with the plan and demonstrated an understanding of the instructions.   The patient was advised to call back or seek an in-person evaluation if the symptoms worsen or if the condition fails to improve as anticipated.  I provided 39mnutes of non-face-to-face time during this encounter. Fu 355mNaMerian CapronMD 12/12/2018

## 2018-12-19 ENCOUNTER — Ambulatory Visit (INDEPENDENT_AMBULATORY_CARE_PROVIDER_SITE_OTHER): Payer: BC Managed Care – PPO | Admitting: Licensed Clinical Social Worker

## 2018-12-19 DIAGNOSIS — F411 Generalized anxiety disorder: Secondary | ICD-10-CM | POA: Diagnosis not present

## 2018-12-19 DIAGNOSIS — F410 Panic disorder [episodic paroxysmal anxiety] without agoraphobia: Secondary | ICD-10-CM | POA: Diagnosis not present

## 2018-12-19 DIAGNOSIS — F331 Major depressive disorder, recurrent, moderate: Secondary | ICD-10-CM | POA: Diagnosis not present

## 2018-12-19 NOTE — Progress Notes (Signed)
Virtual Visit via Telephone Note  I connected with [name] on 12/19/18 at  8:00 AM EDT by telephone and verified that I am speaking with the correct person using two identifiers.   I discussed the limitations, risks, security and privacy concerns of performing an evaluation and management service by telephone and the availability of in person appointments. I also discussed with the patient that there may be a patient responsible charge related to this service. The patient expressed understanding and agreed to proceed.  Follow Up Instructions:    I discussed the assessment and treatment plan with the patient. The patient was provided an opportunity to ask questions and all were answered. The patient agreed with the plan and demonstrated an understanding of the instructions.   The patient was advised to call back or seek an in-person evaluation if the symptoms worsen or if the condition fails to improve as anticipated.  I provided 55 minutes of non-face-to-face time during this encounter.   THERAPIST PROGRESS NOTE  Session Time: 8:01 AM to 8:56 AM  Participation Level: Active  Behavioral Response: CasualAlertDysphoric  Type of Therapy: Individual Therapy  Treatment Goals addressed:  patient will learn self-management skills for mental health, decrease depression, anxiety, panic, learn coping skills  Interventions: CBT, Motivational Interviewing, Strength-based, Supportive, Reframing and Other: coping  Summary: Carol Theys is a 48 y.o. female who presents with started back to my job, laid off first day I went back and now I have to look for a job. Not upset, I thought it might happen. Look for a job that is a little less stressful. .  Depression has not been so good. Call from attorney the other day. [place] about Forensic scientist. There is a problem because he is not helping me with it, degrading, talks over me, had a tirade over the phone. Didn't get mad, just fired him.  Reviewed on stressor for her is not moving forward with the estate.     Describes anxiety and depression coming from the unknown. I didn't think I would be in a situation of being homeless, without a job, can't get a place because I can't afford the rent.  Reviewed strategies to help her to get through this period, managing the estate to give her time to be more stable. Relates older brother will not be supportive, wants his share and doesn't care how it impacts patient. Patient shares that I have divorced him.   Shared feelings also have of being single and alone. Reflected that I was financially in a good position, a position where she would have credit to buy a new home. But things once again fell apart. Now no home, not stable, my life always been this way and maybe meant to be this way. I have always hoped and prayed that things would be different. I have a break through and then it falls through.  Therapist worked with patient on her perspective both for accuracy, and attitudes that help in managing challenges in life the most effectively. Patient shares in terms of adapting to uncertainty I am the type of person that needs to know where the pieces and parts go together before I understand.   Suicidal/Homicidal: No  Therapist Response: Therapist assessed patient current functioning per report, help patient processed feelings related to current stressors and discussed coping with stressors.  Reviewed attitudes that will help manage life, by looking at way life goes, that things happen as a process, they take time to be resolved and to  applied to patient's current stressors in her life.  Reviewed efforts she puts in to resolving stressors is important part but has to be done over a period of time, also helps to have faith in the process, faith can come from spirituality, from experiences of knowing things work out when effort is put in, reviewing once life experiencing and saying this as part of  accurate assessment of life processes. Engaged in problem solving, encouraging siblings come to agreement to support one another in managing mom's estate.  Reviewed difficulties in these relationships that does not make this option possible.  Utilize reframing to see being laid off may be a good thing for patient and give her the possibility of finding something where she can enjoy life more.  Reframe patient's hope of stability in helping patient recognize part of life is change, so stability is not maintained but challenged to adjust to changes.  Reviewed patient not following into compared to spare attitude, recognizing peoples outsides do not mean they are happy inside, this type of comparison leads to negative thoughts, anxiety, low self-esteem.  Recognize patients projection is only that not necessarily other peoples reality.  Reviewed acceptance as an attitude very helpful for coping and reviewed steps of excepting including recognizing and processing feelings, recognizing strengths and weaknesses, helping a person to release resources to move forward in life.   Reviewed self talk is helpful, self talk that accurately assesses situation can help with coping.   Reviewed unknowns as a part of life and healthy adjustment to life involves living comfortably with uncertainty knowing this is the way life is set up. Emphasized effort patient puts in 2 dressing stressors is how she creates things in her life.   Provided strength based and supportive interventions Plan: Return again in 2-3 weeks.2. Work with patient on helpful and accurate perspectives that will help with coping.3.  Look at anxiety and phobia book to help provide more education for patient on acceptance.4.  Work with patient on mood regulation skills and coping Diagnosis: Axis I:  major depressive disorder, recurrent, moderate, generalized anxiety disorder, panic disorder    Axis II: No diagnosis    Cordella Register, LCSW 12/19/2018

## 2018-12-22 ENCOUNTER — Ambulatory Visit: Payer: BC Managed Care – PPO | Admitting: Obstetrics & Gynecology

## 2019-01-08 ENCOUNTER — Other Ambulatory Visit: Payer: Self-pay

## 2019-01-08 ENCOUNTER — Ambulatory Visit (HOSPITAL_COMMUNITY): Payer: BC Managed Care – PPO | Admitting: Licensed Clinical Social Worker

## 2019-01-14 ENCOUNTER — Ambulatory Visit: Payer: BLUE CROSS/BLUE SHIELD

## 2019-01-16 ENCOUNTER — Ambulatory Visit (INDEPENDENT_AMBULATORY_CARE_PROVIDER_SITE_OTHER): Payer: BLUE CROSS/BLUE SHIELD | Admitting: Licensed Clinical Social Worker

## 2019-01-16 DIAGNOSIS — F331 Major depressive disorder, recurrent, moderate: Secondary | ICD-10-CM

## 2019-01-16 DIAGNOSIS — F411 Generalized anxiety disorder: Secondary | ICD-10-CM

## 2019-01-16 DIAGNOSIS — F410 Panic disorder [episodic paroxysmal anxiety] without agoraphobia: Secondary | ICD-10-CM

## 2019-01-16 NOTE — Progress Notes (Signed)
na

## 2019-02-18 ENCOUNTER — Ambulatory Visit: Payer: BLUE CROSS/BLUE SHIELD

## 2019-03-12 ENCOUNTER — Other Ambulatory Visit (HOSPITAL_COMMUNITY): Payer: Self-pay | Admitting: Psychiatry

## 2019-03-16 ENCOUNTER — Encounter (HOSPITAL_COMMUNITY): Payer: Self-pay | Admitting: Psychiatry

## 2019-03-16 ENCOUNTER — Ambulatory Visit (INDEPENDENT_AMBULATORY_CARE_PROVIDER_SITE_OTHER): Payer: Self-pay | Admitting: Psychiatry

## 2019-03-16 DIAGNOSIS — F410 Panic disorder [episodic paroxysmal anxiety] without agoraphobia: Secondary | ICD-10-CM

## 2019-03-16 DIAGNOSIS — F411 Generalized anxiety disorder: Secondary | ICD-10-CM

## 2019-03-16 DIAGNOSIS — F331 Major depressive disorder, recurrent, moderate: Secondary | ICD-10-CM

## 2019-03-16 MED ORDER — VENLAFAXINE HCL ER 150 MG PO CP24: 150.0000 mg | ORAL_CAPSULE | Freq: Every day | ORAL | 0 refills | Status: DC

## 2019-03-16 NOTE — Progress Notes (Signed)
Patient ID: Virginia Logan, female   DOB: Jun 28, 1970, 48 y.o.   MRN: [id number] [facility] Follow-up Outpatient Visit Tele psych Virginia Logan [id number] 48 y.o.  03/16/2019  Chief Complaint: follow up anxiety History of Present Illness:   Patient returns for Medication Follow up and is diagnosed with Major depressive disorder moderate and generalized anxiety disorder. Panic disorder.    I connected with Virginia Logan on 03/16/19 at 10:00 AM EDT by telephone and verified that I am speaking with the correct person using two identifiers.  I discussed the limitations, risks, security and privacy concerns of performing an evaluation and management service by telephone and the availability of in person appointments. I also discussed with the patient that there may be a patient responsible charge related to this service. The patient expressed understanding and agreed to proceed.  Got laid off from work  Looking for another rjob Managing stress reasonable Insomnia: on trazadone.   Aggravating factor : past  job stress, Modifying factors; some friends  Severity of depression:worse more so anxiety as of now  Associated symptoms: insomnia at times. No psychosis  Past Medical History:  Diagnosis Date  . Alopecia   . Anemia   . Anxiety   . Cancer (HCC)    cervical cancer, melanoma of vulva  . Cervical cancer ([place])   . Colitis   . Depression   . Endometriosis   . GERD (gastroesophageal reflux disease)   . Panic attack    Family History  Problem Relation Age of Onset  . Diabetes Mother   . Hyperlipidemia Mother   . Heart attack Mother   . Lung cancer Mother 18  . Uterine cancer Mother   . Cancer Father        prostate  . Graves' disease Father   . Depression Father   . Anxiety disorder Father   . Crohn's disease Brother   . Dementia Maternal Aunt   . Colon cancer Maternal Aunt 16       died at 22  . Dementia Maternal Uncle   . Dementia Paternal  Grandmother   . Diverticulitis Paternal Grandmother   . Scleroderma Paternal Grandmother   . Bipolar disorder Neg Hx   . Schizophrenia Neg Hx   . OCD Neg Hx   . Alcohol abuse Neg Hx   . Drug abuse Neg Hx   . Esophageal cancer Neg Hx   . Rectal cancer Neg Hx   . Stomach cancer Neg Hx     Outpatient Encounter Medications as of 03/16/2019  Medication Sig  . clobetasol (TEMOVATE) 0.05 % external solution Apply 1 application topically at bedtime. After 2 weeks of use take a break for one week.  . clonazePAM (KLONOPIN) 0.5 MG tablet Take 1 tablet (0.5 mg total) by mouth 2 (two) times daily as needed for anxiety.  Marland Kitchen levalbuterol (XOPENEX HFA) 45 MCG/ACT inhaler Inhale 2 puffs into the lungs every 4 (four) hours as needed for wheezing or shortness of breath.  Marland Kitchen omeprazole (PRILOSEC) 20 MG capsule Take 1 capsule (20 mg total) by mouth 2 (two) times daily before a meal. After 6 weeks will reduce dose to the lowest effective dose.  . promethazine (PHENERGAN) 25 MG tablet Take 1 tablet (25 mg total) by mouth every 8 (eight) hours as needed for nausea or vomiting.  . traZODone (DESYREL) 100 MG tablet Take 1 tablet (100 mg total) by mouth at bedtime as needed for sleep. Due for follow up visit  . venlafaxine XR (  EFFEXOR-XR) 150 MG 24 hr capsule Take 1 capsule (150 mg total) by mouth daily with breakfast.  . [DISCONTINUED] venlafaxine XR (EFFEXOR-XR) 150 MG 24 hr capsule Take 1 capsule (150 mg total) by mouth daily with breakfast.   No facility-administered encounter medications on file as of 03/16/2019.     No results found for this or any previous visit (from the past 2160 hour(s)).  There were no vitals taken for this visit.   Review of Systems  Cardiovascular: Negative for chest pain.  Skin: Negative for rash.  Psychiatric/Behavioral: Negative for substance abuse.    Mental Status Examination  Appearance: casual Alert: Yes Attention: fair  Cooperative: Yes Eye Contact: Speech: normal  tone Psychomotor Activity:  Memory/Concentration: adequate Oriented: person, place, time/date and situation Mood:fair Affect: Congruent Thought Processes and Associations: Coherent Fund of Knowledge: Fair Thought Content: Suicidal ideation and Homicidal ideation were denied Insight: Fair Judgement: Fair  Diagnosis/ Treatment: Maj. depressive disorder recurrent moderate. Better. Continue effexor   Generalized anxiety disorder : doing fair  Continue effexor Insomnia: doing fair on trazadone. continue Work on Neurosurgeon and therapy   Panic disorder: not worse,  Has lost weight as she is walking more    I discussed the assessment and treatment plan with the patient. The patient was provided an opportunity to ask questions and all were answered. The patient agreed with the plan and demonstrated an understanding of the instructions.   The patient was advised to call back or seek an in-person evaluation if the symptoms worsen or if the condition fails to improve as anticipated.  I provided 62minutes of non-face-to-face time during this encounter. Fu 45m  Merian Capron, MD 03/16/2019

## 2019-06-02 ENCOUNTER — Emergency Department (INDEPENDENT_AMBULATORY_CARE_PROVIDER_SITE_OTHER)
Admission: EM | Admit: 2019-06-02 | Discharge: 2019-06-02 | Disposition: A | Discharge status: Home / Self Care | Payer: Self-pay | Source: Home / Self Care

## 2019-06-02 ENCOUNTER — Encounter: Payer: Self-pay | Admitting: Emergency Medicine

## 2019-06-02 ENCOUNTER — Other Ambulatory Visit: Payer: Self-pay

## 2019-06-02 DIAGNOSIS — M62830 Muscle spasm of back: Secondary | ICD-10-CM

## 2019-06-02 DIAGNOSIS — S39012A Strain of muscle, fascia and tendon of lower back, initial encounter: Secondary | ICD-10-CM

## 2019-06-02 MED ORDER — METHOCARBAMOL 500 MG PO TABS: 500.0000 mg | ORAL_TABLET | Freq: Four times a day (QID) | ORAL | 0 refills | Status: DC | PRN

## 2019-06-02 MED ORDER — KETOROLAC TROMETHAMINE 60 MG/2ML IM SOLN
60.0000 mg | Freq: Once | INTRAMUSCULAR | Status: AC
  Administered 2019-06-02: 60 mg via INTRAMUSCULAR

## 2019-06-02 MED ORDER — MELOXICAM 15 MG PO TABS: 15.0000 mg | ORAL_TABLET | Freq: Every day | ORAL | 0 refills | Status: DC

## 2019-06-02 NOTE — ED Triage Notes (Signed)
Pt states yesterday her right side of her back started hurting severely. States she has been very active the last few day.

## 2019-06-02 NOTE — ED Provider Notes (Signed)
[name]    CSN: [id number] Arrival date & time: 06/02/19  0805      History   Chief Complaint Chief Complaint  Patient presents with  . Back Pain    HPI Virginia Logan is a 49 y.o. female.   49 year old female, with history of anemia, anxiety, depression, endometriosis, presenting today complaining of back pain.  Patient states that she has had several injuries to her right lumbar region over the past 3 to 4 days.  States that she fell about 3 days ago and twisted her low back the following day.  States it was feeling better yesterday after using ice.  States that last night, she was walking her dog and the dog pulled her, reinjuring the affected area.  Increased pain this morning with some associated spasms.  No radiation of the pain.  No loss of bowel or bladder function, saddle anesthesia, numbness or weakness in the lower extremities.  The history is provided by the patient.  Back Pain Location:  Lumbar spine Quality:  Aching Radiates to:  Does not radiate Pain severity:  Moderate Onset quality:  Gradual Duration:  1 day Timing:  Constant Progression:  Worsening Chronicity:  New Context: falling and twisting   Context: not emotional stress, not jumping from heights and not lifting heavy objects   Relieved by:  Cold packs Worsened by:  Bending, twisting and ambulation Associated symptoms: no abdominal pain, no abdominal swelling, no bladder incontinence, no bowel incontinence, no chest pain, no dysuria, no fever, no headaches, no leg pain, no numbness, no paresthesias, no pelvic pain, no perianal numbness, no tingling, no weakness and no weight loss   Risk factors: no hx of osteoporosis, no lack of exercise, no menopause, not obese, not pregnant, no recent surgery, no steroid use and no vascular disease     Past Medical History:  Diagnosis Date  . Alopecia   . Anemia   . Anxiety   . Cancer (HCC)    cervical cancer, melanoma of vulva  . Cervical cancer  ([place])   . Colitis   . Depression   . Endometriosis   . GERD (gastroesophageal reflux disease)   . Panic attack     Patient Active Problem List   Diagnosis Date Noted  . Post-menopausal bleeding 10/27/2018  . Urge incontinence 10/27/2018  . Insomnia 08/23/2017  . Hair loss 11/30/2015  . Alopecia areata 11/30/2015  . Panic disorder 07/31/2013  . Depression, major, single episode, moderate ([place]) 07/08/2013  . Generalized anxiety disorder 07/08/2013  . Depression with anxiety 07/24/2012  . Endometriosis 08/31/2010  . ASTHMA UNSPECIFIED WITH EXACERBATION 11/10/2009    Past Surgical History:  Procedure Laterality Date  . DILATION AND CURETTAGE OF UTERUS    . left ankle    . WISDOM TOOTH EXTRACTION      OB History    Gravida  3   Para  1   Term      Preterm      AB  2   Living  1     SAB  2   TAB      Ectopic      Multiple      Live Births               Home Medications    Prior to Admission medications   Medication Sig Start Date End Date Taking? Authorizing Provider  clobetasol (TEMOVATE) 0.05 % external solution Apply 1 application topically at bedtime. After 2 weeks of  use take a break for one week. 08/19/18   Hali Marry, MD  clonazePAM (KLONOPIN) 0.5 MG tablet Take 1 tablet (0.5 mg total) by mouth 2 (two) times daily as needed for anxiety. 06/25/18   Donella Stade, PA-C  levalbuterol (XOPENEX HFA) 45 MCG/ACT inhaler Inhale 2 puffs into the lungs every 4 (four) hours as needed for wheezing or shortness of breath. 10/21/18   Hali Marry, MD  meloxicam (MOBIC) 15 MG tablet Take 1 tablet (15 mg total) by mouth daily. 06/02/19   Cooper Moroney C, PA-C  methocarbamol (ROBAXIN) 500 MG tablet Take 1 tablet (500 mg total) by mouth every 6 (six) hours as needed for muscle spasms. 06/02/19   Dastan Krider C, PA-C  omeprazole (PRILOSEC) 20 MG capsule Take 1 capsule (20 mg total) by mouth 2 (two) times daily before a meal. After 6 weeks will reduce  dose to the lowest effective dose. 10/21/18 12/02/18  Cirigliano, Vito V, DO  promethazine (PHENERGAN) 25 MG tablet Take 1 tablet (25 mg total) by mouth every 8 (eight) hours as needed for nausea or vomiting. 10/09/18   Cirigliano, Vito V, DO  traZODone (DESYREL) 100 MG tablet Take 1 tablet (100 mg total) by mouth at bedtime as needed for sleep. Due for follow up visit 12/12/18   Merian Capron, MD  venlafaxine XR (EFFEXOR-XR) 150 MG 24 hr capsule Take 1 capsule (150 mg total) by mouth daily with breakfast. 03/16/19   Merian Capron, MD    Family History Family History  Problem Relation Age of Onset  . Diabetes Mother   . Hyperlipidemia Mother   . Heart attack Mother   . Lung cancer Mother 84  . Uterine cancer Mother   . Cancer Father        prostate  . Graves' disease Father   . Depression Father   . Anxiety disorder Father   . Crohn's disease Brother   . Dementia Maternal Aunt   . Colon cancer Maternal Aunt 76       died at 92  . Dementia Maternal Uncle   . Dementia Paternal Grandmother   . Diverticulitis Paternal Grandmother   . Scleroderma Paternal Grandmother   . Bipolar disorder Neg Hx   . Schizophrenia Neg Hx   . OCD Neg Hx   . Alcohol abuse Neg Hx   . Drug abuse Neg Hx   . Esophageal cancer Neg Hx   . Rectal cancer Neg Hx   . Stomach cancer Neg Hx     Social History Social History   Tobacco Use  . Smoking status: Former Smoker    Packs/day: 0.30    Types: Cigarettes    Quit date: 08/20/2013    Years since quitting: 5.7  . Smokeless tobacco: Never Used  Substance Use Topics  . Alcohol use: No  . Drug use: No    Comment: Caffeine: Coffee 2-3 cupsper day.      Allergies   Bactrim [sulfamethoxazole-trimethoprim], Ambien [zolpidem], Paxil [paroxetine hcl], and Moxifloxacin   Review of Systems Review of Systems  Constitutional: Negative for chills, fever and weight loss.  HENT: Negative for ear pain and sore throat.   Eyes: Negative for pain and visual  disturbance.  Respiratory: Negative for cough and shortness of breath.   Cardiovascular: Negative for chest pain and palpitations.  Gastrointestinal: Negative for abdominal pain, bowel incontinence and vomiting.  Genitourinary: Negative for bladder incontinence, dysuria, hematuria and pelvic pain.  Musculoskeletal: Positive for back pain. Negative for  arthralgias.  Skin: Negative for color change and rash.  Neurological: Negative for tingling, seizures, syncope, weakness, numbness, headaches and paresthesias.  All other systems reviewed and are negative.    Physical Exam Triage Vital Signs ED Triage Vitals  Enc Vitals Group     BP 06/02/19 0824 130/84     Pulse Rate 06/02/19 0824 74     Resp --      Temp 06/02/19 0824 98.2 F (36.8 C)     Temp Source 06/02/19 0824 Oral     SpO2 06/02/19 0824 99 %     Weight --      Height --      Head Circumference --      Peak Flow --      Pain Score 06/02/19 0821 10     Pain Loc --      Pain Edu? --      Excl. in [place]? --    No data found.  Updated Vital Signs BP 130/84 (BP Location: Right Arm)   Pulse 74   Temp 98.2 F (36.8 C) (Oral)   SpO2 99%   Visual Acuity Right Eye Distance:   Left Eye Distance:   Bilateral Distance:    Right Eye Near:   Left Eye Near:    Bilateral Near:     Physical Exam Vitals and nursing note reviewed.  Constitutional:      General: She is not in acute distress.    Appearance: She is well-developed.  HENT:     Head: Normocephalic and atraumatic.  Eyes:     Conjunctiva/sclera: Conjunctivae normal.  Cardiovascular:     Rate and Rhythm: Normal rate and regular rhythm.     Heart sounds: No murmur.  Pulmonary:     Effort: Pulmonary effort is normal. No respiratory distress.     Breath sounds: Normal breath sounds.  Abdominal:     Palpations: Abdomen is soft.     Tenderness: There is no abdominal tenderness.  Musculoskeletal:     Cervical back: Neck supple.     Lumbar back: Spasms and  tenderness present.       Back:     Comments: No midline back pain.  Tenderness of the right-sided lumbar musculature with spasm  Skin:    General: Skin is warm and dry.  Neurological:     Mental Status: She is alert.     GCS: GCS eye subscore is 4. GCS verbal subscore is 5. GCS motor subscore is 6.     Cranial Nerves: Cranial nerves are intact.     Sensory: Sensation is intact.     Motor: Motor function is intact.     Coordination: Coordination is intact.     Gait: Gait is intact.     Deep Tendon Reflexes: Reflexes are normal and symmetric.      UC Treatments / Results  Labs (all labs ordered are listed, but only abnormal results are displayed) Labs Reviewed - No data to display  EKG   Radiology No results found.  Procedures Procedures (including critical care time)  Medications Ordered in UC Medications  ketorolac (TORADOL) injection 60 mg (has no administration in time range)    Initial Impression / Assessment and Plan / UC Course  I have reviewed the triage vital signs and the nursing notes.  Pertinent labs & imaging results that were available during my care of the patient were reviewed by me and considered in my medical decision making (see chart for details).  Right-sided lumbar back pain after 3 injuries within a 4-day period.  Patient does have a documented history of cancer on her medical problem list.  She has no midline pain and no red flag symptoms.  Believe that this is musculoskeletal pain.  Will treat with anti-inflammatories and muscle relaxants. Final Clinical Impressions(s) / UC Diagnoses   Final diagnoses:  Muscle spasm of back  Strain of lumbar region, initial encounter     Discharge Instructions     Salonpas over-the-counter lidocaine patches may also help you with localized pain.    ED Prescriptions    Medication Sig Dispense Auth. Provider   methocarbamol (ROBAXIN) 500 MG tablet Take 1 tablet (500 mg total) by mouth every 6  (six) hours as needed for muscle spasms. 20 tablet Starlett Pehrson C, PA-C   meloxicam (MOBIC) 15 MG tablet Take 1 tablet (15 mg total) by mouth daily. 30 tablet Satsuki Zillmer C, PA-C     PDMP not reviewed this encounter.   Phebe Colla, [place] 06/02/19 [phone]

## 2019-06-02 NOTE — Discharge Instructions (Addendum)
Salonpas over-the-counter lidocaine patches may also help you with localized pain.

## 2019-06-08 ENCOUNTER — Institutional Professional Consult (permissible substitution): Payer: Self-pay | Admitting: Obstetrics & Gynecology

## 2019-07-10 ENCOUNTER — Other Ambulatory Visit (HOSPITAL_COMMUNITY): Payer: Self-pay | Admitting: Psychiatry

## 2019-07-10 ENCOUNTER — Other Ambulatory Visit: Payer: Self-pay | Admitting: Family Medicine

## 2019-07-15 ENCOUNTER — Encounter (HOSPITAL_COMMUNITY): Payer: Self-pay | Admitting: Psychiatry

## 2019-07-15 ENCOUNTER — Other Ambulatory Visit: Payer: Self-pay

## 2019-07-15 ENCOUNTER — Ambulatory Visit (INDEPENDENT_AMBULATORY_CARE_PROVIDER_SITE_OTHER): Payer: Self-pay | Admitting: Psychiatry

## 2019-07-15 DIAGNOSIS — F411 Generalized anxiety disorder: Secondary | ICD-10-CM

## 2019-07-15 DIAGNOSIS — F331 Major depressive disorder, recurrent, moderate: Secondary | ICD-10-CM

## 2019-07-15 DIAGNOSIS — F410 Panic disorder [episodic paroxysmal anxiety] without agoraphobia: Secondary | ICD-10-CM

## 2019-07-15 MED ORDER — TRAZODONE HCL 100 MG PO TABS: 100.0000 mg | ORAL_TABLET | Freq: Every evening | ORAL | 0 refills | Status: DC | PRN

## 2019-07-15 MED ORDER — VENLAFAXINE HCL ER 150 MG PO CP24: 150.0000 mg | ORAL_CAPSULE | Freq: Every day | ORAL | 0 refills | Status: DC

## 2019-07-15 NOTE — Progress Notes (Signed)
Patient ID: Virginia Logan, female   DOB: Apr 20, 1971, 49 y.o.   MRN: [id number] Virginia Logan Follow-up Outpatient Visit Tele psych [name] [id number] 49 y.o.  07/15/2019  Chief Complaint: follow up anxiety History of Present Illness:   Patient returns for Medication Follow up and is diagnosed with Major depressive disorder moderate and generalized anxiety disorder. Panic disorder.   I connected with [name] on 07/15/19 at 10:30 AM EST by telephone and verified that I am speaking with the correct person using two identifiers.    I discussed the limitations, risks, security and privacy concerns of performing an evaluation and management service by telephone and the availability of in person appointments. I also discussed with the patient that there may be a patient responsible charge related to this service. The patient expressed understanding and agreed to proceed.  Got laid off from work on [employer] Has to move out of Mom house as brothers want it to put on market Stressed but says will handle it ,  Does not want to increase meds  Looking for another rjob  Insomnia: on trazadone.   Aggravating factor : past  job stress, housing concern Modifying factors; some friends    Associated symptoms: insomnia at times. No psychosis  Past Medical History:  Diagnosis Date  . Alopecia   . Anemia   . Anxiety   . Cancer (HCC)    cervical cancer, melanoma of vulva  . Cervical cancer ([place])   . Colitis   . Depression   . Endometriosis   . GERD (gastroesophageal reflux disease)   . Panic attack    Family History  Problem Relation Age of Onset  . Diabetes Mother   . Hyperlipidemia Mother   . Heart attack Mother   . Lung cancer Mother 13  . Uterine cancer Mother   . Cancer Father        prostate  . Graves' disease Father   . Depression Father   . Anxiety disorder Father   . Crohn's disease Brother   . Dementia Maternal Aunt   . Colon cancer  Maternal Aunt 81       died at 69  . Dementia Maternal Uncle   . Dementia Paternal Grandmother   . Diverticulitis Paternal Grandmother   . Scleroderma Paternal Grandmother   . Bipolar disorder Neg Hx   . Schizophrenia Neg Hx   . OCD Neg Hx   . Alcohol abuse Neg Hx   . Drug abuse Neg Hx   . Esophageal cancer Neg Hx   . Rectal cancer Neg Hx   . Stomach cancer Neg Hx     Outpatient Encounter Medications as of 07/15/2019  Medication Sig  . clobetasol (TEMOVATE) 0.05 % external solution Apply 1 application topically at bedtime. After 2 weeks of use take a break for one week.  . clonazePAM (KLONOPIN) 0.5 MG tablet Take 1 tablet (0.5 mg total) by mouth 2 (two) times daily as needed for anxiety.  Marland Kitchen estradiol (VIVELLE-DOT) 0.025 MG/24HR 1 PATCH ONTO THE SKIN EVERY WED AND SAT  . levalbuterol (XOPENEX HFA) 45 MCG/ACT inhaler Inhale 2 puffs into the lungs every 4 (four) hours as needed for wheezing or shortness of breath.  . meloxicam (MOBIC) 15 MG tablet Take 1 tablet (15 mg total) by mouth daily.  . methocarbamol (ROBAXIN) 500 MG tablet Take 1 tablet (500 mg total) by mouth every 6 (six) hours as needed for muscle spasms.  Marland Kitchen omeprazole (PRILOSEC) 20 MG capsule Take 1  capsule (20 mg total) by mouth 2 (two) times daily before a meal. After 6 weeks will reduce dose to the lowest effective dose.  . promethazine (PHENERGAN) 25 MG tablet Take 1 tablet (25 mg total) by mouth every 8 (eight) hours as needed for nausea or vomiting.  . traZODone (DESYREL) 100 MG tablet Take 1 tablet (100 mg total) by mouth at bedtime as needed for sleep. Due for follow up visit  . venlafaxine XR (EFFEXOR-XR) 150 MG 24 hr capsule Take 1 capsule (150 mg total) by mouth daily with breakfast.  . [DISCONTINUED] traZODone (DESYREL) 100 MG tablet Take 1 tablet (100 mg total) by mouth at bedtime as needed for sleep. Due for follow up visit  . [DISCONTINUED] venlafaxine XR (EFFEXOR-XR) 150 MG 24 hr capsule Take 1 capsule (150 mg  total) by mouth daily with breakfast.   No facility-administered encounter medications on file as of 07/15/2019.    No results found for this or any previous visit (from the past 2160 hour(s)).  There were no vitals taken for this visit.   Review of Systems  Cardiovascular: Negative for chest pain.  Psychiatric/Behavioral: Negative for substance abuse.    Mental Status Examination  Appearance: casual Alert: Yes Attention: fair  Cooperative: Yes Eye Contact: Speech: normal tone Psychomotor Activity:  Memory/Concentration: adequate Oriented: person, place, time/date and situation Mood: subdued Affect: Congruent Thought Processes and Associations: Coherent Fund of Knowledge: Fair Thought Content: Suicidal ideation and Homicidal ideation were denied Insight: Fair Judgement: Fair  Diagnosis/ Treatment: Maj. depressive disorder recurrent moderate. Subdued, continue effexor. She does not want to increase med or consider therapy Provided supportive therapy  , says have unemployment and will manage it Advised to call within a week of status and she agrees    Generalized anxiety disorder : stressed due to circumstances, continue effexor, see above Insomnia: doing fair on trazadone. continue Work on Neurosurgeon and therapy   Panic disorder: baseline    I discussed the assessment and treatment plan with the patient. The patient was provided an opportunity to ask questions and all were answered. The patient agreed with the plan and demonstrated an understanding of the instructions.   The patient was advised to call back or seek an in-person evaluation if the symptoms worsen or if the condition fails to improve as anticipated.  I provided 8minutes of non-face-to-face time during this encounter. Fu 3-4w or earlier if needed  Merian Capron, MD 07/15/2019

## 2019-07-22 IMAGING — CT CT ABDOMEN AND PELVIS WITH CONTRAST
2 of 5 series · 16 of 46 positions shown, 18 images · IV contrast (APPLIED)
Comparison: None.

CLINICAL DATA: Diarrhea vomiting last 6 weeks. Pelvic perineal
pain. Distant history of cervical cancer.

EXAM:
CT ABDOMEN AND PELVIS WITH CONTRAST
TECHNIQUE: Multidetector CT imaging of the abdomen and pelvis was performed
using the standard protocol following bolus administration of
intravenous contrast.
CONTRAST:  100mL 1XTDHC-B99 IOPAMIDOL (1XTDHC-B99) INJECTION 61%

[Series 2: axial st · axial · 0.85mm/px · z∈[-516,-36]mm · 13 of 108 slices shown, 15 images]
[im 6/108  soft-tissue]
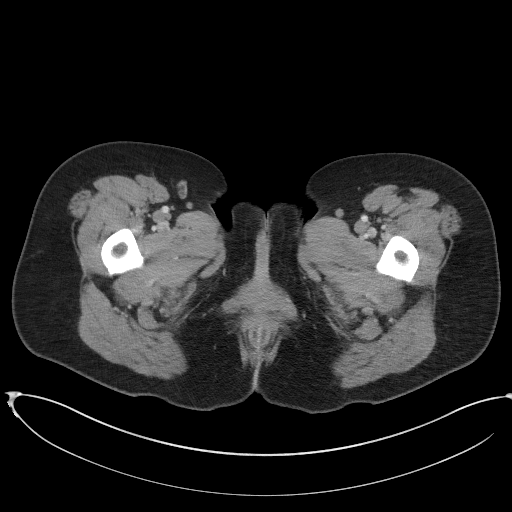
[im 6/108  bone]
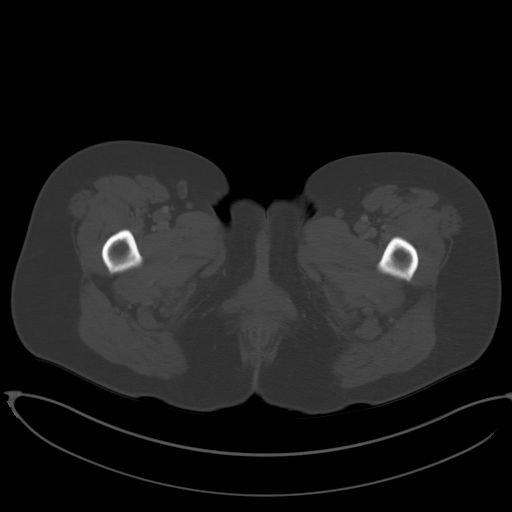
[im 12/108  soft-tissue]
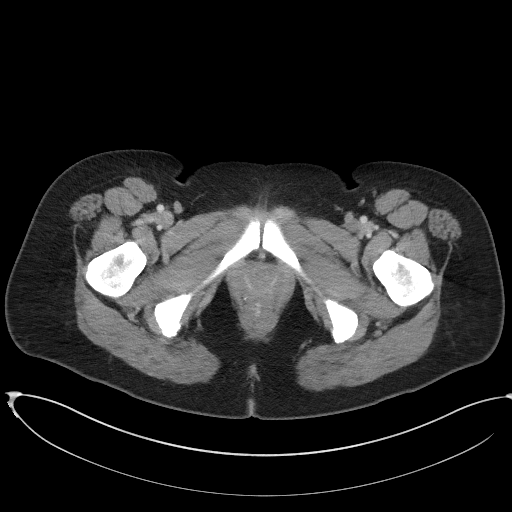
[im 24/108  soft-tissue]
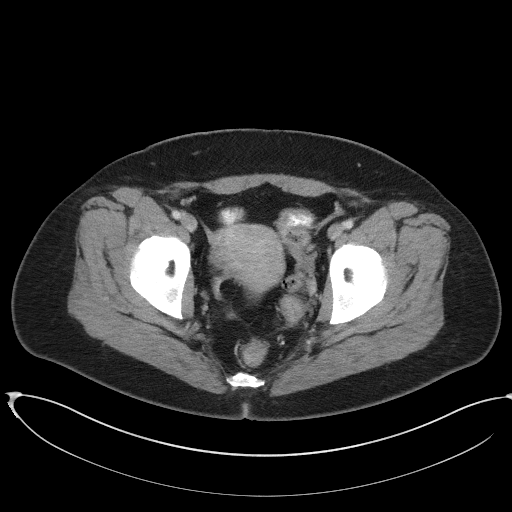
[im 30/108  soft-tissue]
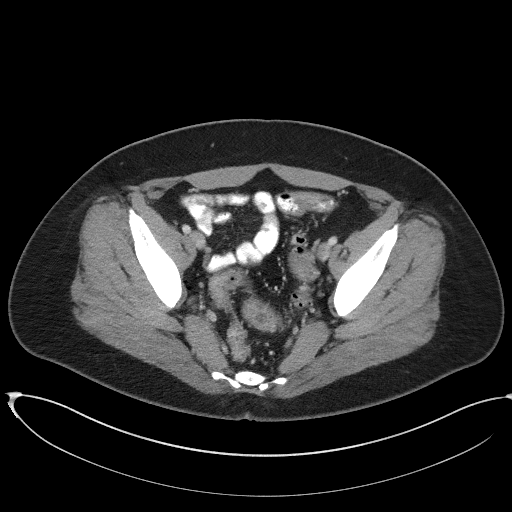
[im 36/108  soft-tissue]
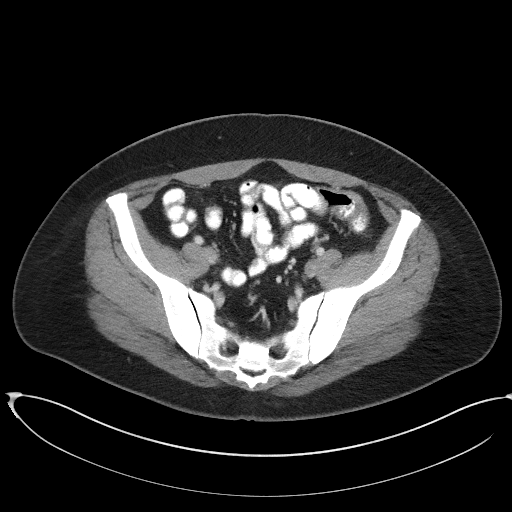
[im 48/108  soft-tissue]
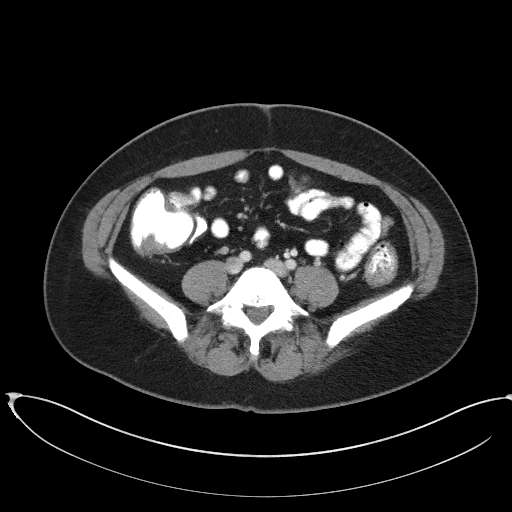
[im 54/108  soft-tissue]
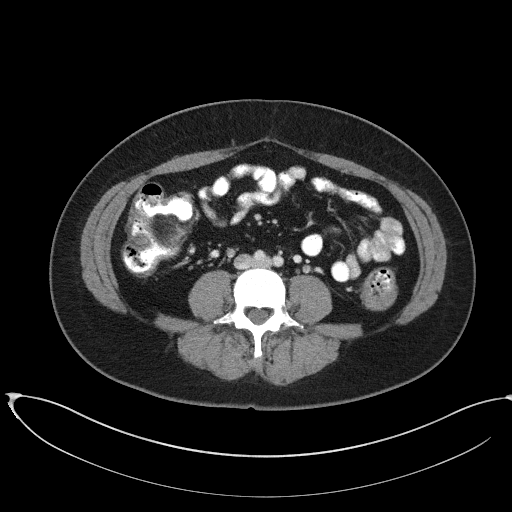
[im 60/108  soft-tissue]
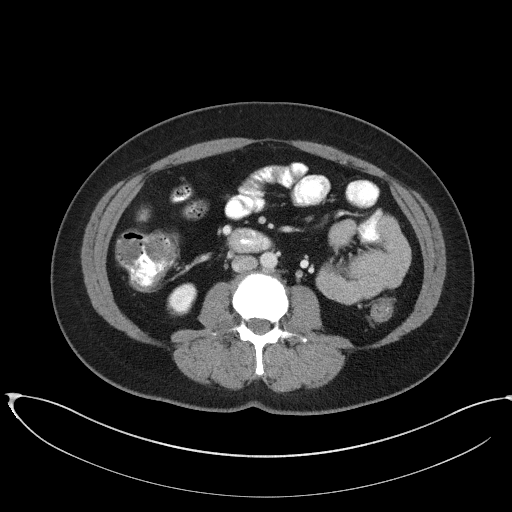
[im 72/108  soft-tissue]
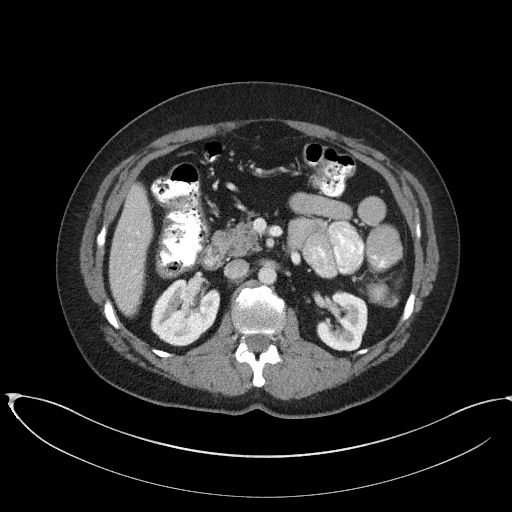
[im 72/108  bone]
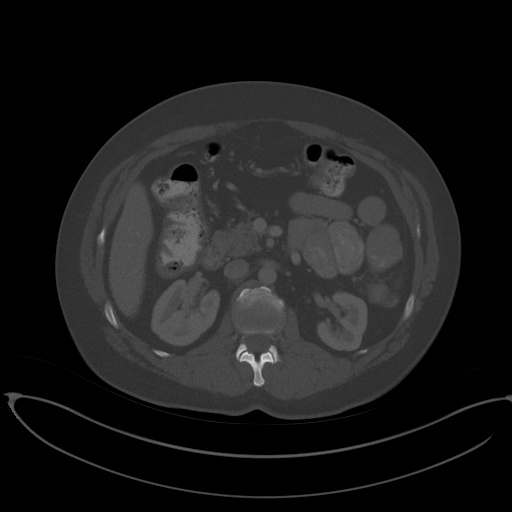
[im 78/108  soft-tissue]
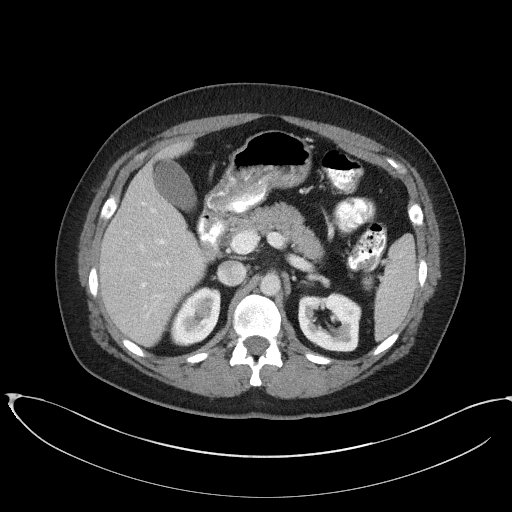
[im 84/108  soft-tissue]
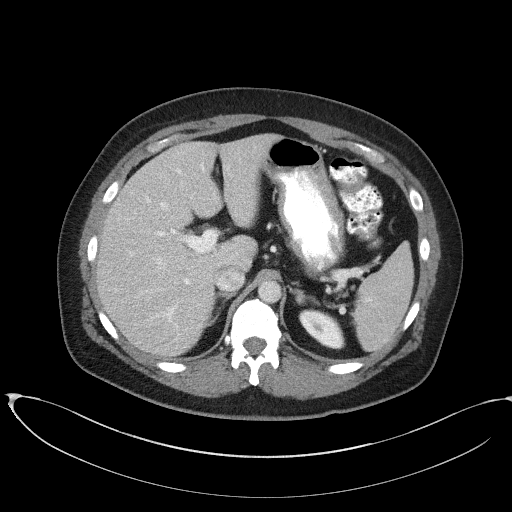
[im 96/108  soft-tissue]
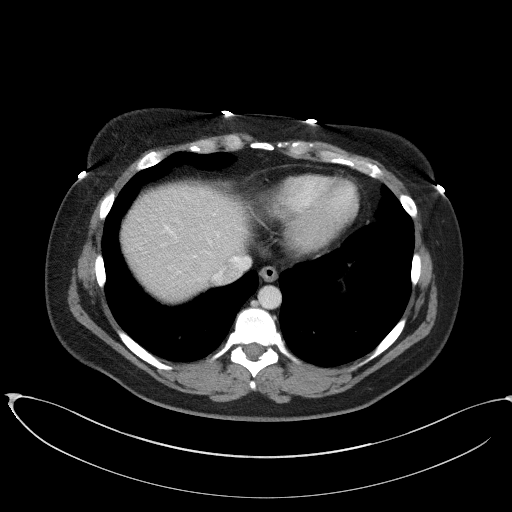
[im 102/108  soft-tissue]
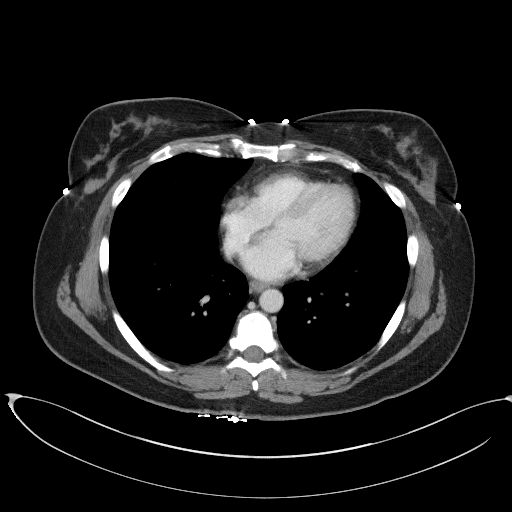

[Series 5: coronal st · coronal · 0.84mm/px · 3 of 99 slices shown]
[im 33/99  soft-tissue]
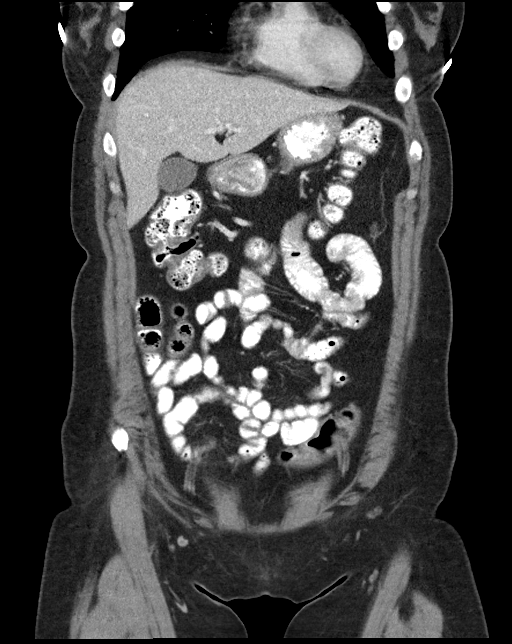
[im 44/99  soft-tissue]
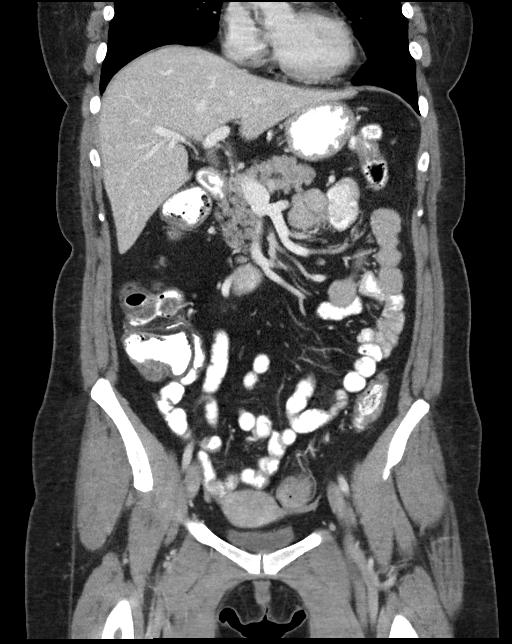
[im 55/99  soft-tissue]
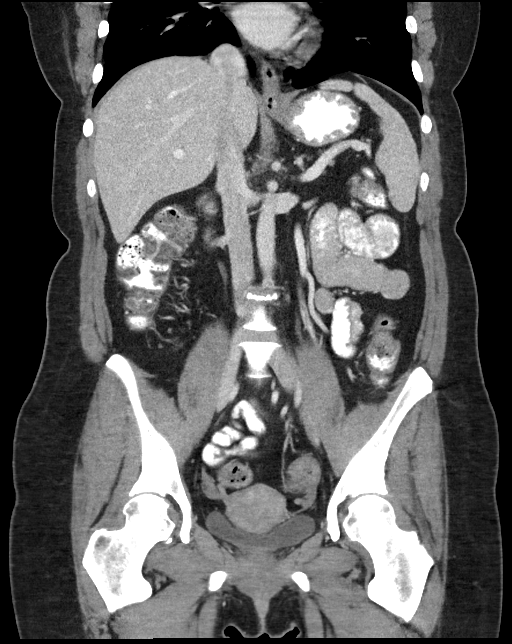

[16 of 46 positions shown; findings below may reference images not displayed]

FINDINGS: Lower chest: Normal

Hepatobiliary: Normal

Pancreas: Normal

Spleen: Normal

Adrenals/Urinary Tract: Adrenal glands are normal. Kidneys are
normal. Bladder is normal.

Stomach/Bowel: No acute or significant bowel finding. Mild sigmoid
diverticulosis without evidence of diverticulitis. At the tip of the
cecum, there is some slightly prominent tissue that may either
represent collapsed bowel or stool. However, I cannot completely
rule out the possibility of a mucosal mass lesion. Has the patient
had colonoscopy? That could be considered given her age.

Vascular/Lymphatic: Normal

Reproductive: Normal.  No perineal abnormality is seen.

Other: No free fluid or air.

Musculoskeletal: Normal
IMPRESSION: No abnormality seen to explain the presenting symptoms. No evidence
of solid organ pathology. No definite acute bowel finding. I
question the possibility of soft tissue prominence at the tip of the
cecum, but this is not a reliable finding by CT. Has the patient had
a recent colonoscopy? If not, that could be considered given the
patient's age.

## 2019-07-28 ENCOUNTER — Ambulatory Visit: Payer: Self-pay | Admitting: Certified Nurse Midwife

## 2019-07-28 ENCOUNTER — Encounter: Payer: Self-pay | Admitting: Certified Nurse Midwife

## 2019-07-28 ENCOUNTER — Other Ambulatory Visit: Payer: Self-pay

## 2019-07-28 VITALS — BP 127/73 | HR 87 | Temp 98.2°F | Ht 64.0 in | Wt 151.0 lb

## 2019-07-28 DIAGNOSIS — S3141XA Laceration without foreign body of vagina and vulva, initial encounter: Secondary | ICD-10-CM

## 2019-07-28 MED ORDER — LIDOCAINE 5 % EX OINT: 1.0000 "application " | TOPICAL_OINTMENT | CUTANEOUS | 0 refills | Status: DC | PRN

## 2019-07-28 NOTE — Progress Notes (Signed)
History:  [name] is a 49 y.o. [id number] who presents to clinic today for vaginal pain. She reports having IC for the first time in 3 years on Friday. Since then reports vaginal pain and pressure, "like something is coming out". She reports over time pain has decreased but not resolved. She denies using lubrication during intercourse.   She reports having spotting after intercourse, but denies current vaginal bleeding. Denies discharge or odor.    The following portions of the patient's history were reviewed and updated as appropriate: allergies, current medications, family history, past medical history, social history, past surgical history and problem list.  Review of Systems:  Review of Systems  Constitutional: Negative.   Respiratory: Negative.   Cardiovascular: Negative.   Gastrointestinal: Negative.   Genitourinary:       Vaginal pain and pressure  Musculoskeletal: Negative.   Neurological: Negative.   Psychiatric/Behavioral: Negative.      Objective:  Physical Exam BP 127/73   Pulse 87   Temp 98.2 F (36.8 C)   Ht 5\' 4"  (1.626 m)   Wt 151 lb (68.5 kg)   BMI 25.92 kg/m  Physical Exam Vitals reviewed. Exam conducted with a chaperone present.  Cardiovascular:     Rate and Rhythm: Normal rate and regular rhythm.  Pulmonary:     Effort: Pulmonary effort is normal. No respiratory distress.     Breath sounds: Normal breath sounds. No wheezing.  Abdominal:     General: There is no distension.     Palpations: Abdomen is soft. There is no mass.     Tenderness: There is no abdominal tenderness. There is no guarding.  Genitourinary:    Exam position: Lithotomy position.     Vagina: Normal. No prolapsed vaginal walls.     Cervix: Normal.     Uterus: Not enlarged, not tender and no uterine prolapse.      Adnexa: Right adnexa normal and left adnexa normal.       Comments: No urethral or rectal prolapse on examination  Neurological:     Mental Status: She is alert  and oriented to person, place, and time.  Psychiatric:        Mood and Affect: Mood normal.        Behavior: Behavior normal.        Thought Content: Thought content normal.     Assessment & Plan:  1. Vaginal laceration, initial encounter - Educated and discussed vaginal atrophy due to menopause (patient reports no cycle in 2 years)  - Encouraged use of lubrication prior to IC and during, samples given to patient  - Discussed rest and abstaining from IC until laceration healed, Rx for lidocaine to be applied to laceration and use of ice for pain  - lidocaine (XYLOCAINE) 5 % ointment; Apply 1 application topically as needed.  Dispense: 30 g; Refill: 0   Lajean Manes, [place] 07/28/2019 4:45 PM

## 2019-08-12 ENCOUNTER — Ambulatory Visit (INDEPENDENT_AMBULATORY_CARE_PROVIDER_SITE_OTHER): Payer: Self-pay | Admitting: Psychiatry

## 2019-08-12 ENCOUNTER — Encounter (HOSPITAL_COMMUNITY): Payer: Self-pay | Admitting: Psychiatry

## 2019-08-12 DIAGNOSIS — F410 Panic disorder [episodic paroxysmal anxiety] without agoraphobia: Secondary | ICD-10-CM

## 2019-08-12 DIAGNOSIS — F331 Major depressive disorder, recurrent, moderate: Secondary | ICD-10-CM

## 2019-08-12 DIAGNOSIS — F411 Generalized anxiety disorder: Secondary | ICD-10-CM

## 2019-08-12 DIAGNOSIS — G4700 Insomnia, unspecified: Secondary | ICD-10-CM

## 2019-08-12 MED ORDER — MIRTAZAPINE 7.5 MG PO TABS: 7.5000 mg | ORAL_TABLET | Freq: Every day | ORAL | 1 refills | Status: DC

## 2019-08-12 NOTE — Progress Notes (Signed)
Patient ID: Virginia Logan, female   DOB: 05/16/1971, 49 y.o.   MRN: [id number] [facility] Follow-up Outpatient Visit Tele psych Virginia Logan [id number] 49 y.o.  08/12/2019  Chief Complaint: follow up depression History of Present Illness:   Patient returns for Medication Follow up and is diagnosed with Major depressive disorder moderate and generalized anxiety disorder. Panic disorder.    I connected with Virginia Logan on 08/12/19 at 10:00 AM EDT by telephone and verified that I am speaking with the correct person using two identifiers.    I discussed the limitations, risks, security and privacy concerns of performing an evaluation and management service by telephone and the availability of in person appointments. I also discussed with the patient that there may be a patient responsible charge related to this service. The patient expressed understanding and agreed to proceed.  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 No psychosis  Past Medical History:  Diagnosis Date  . Alopecia   . Anemia   . Anxiety   . Cancer (HCC)    cervical cancer, melanoma of vulva  . Cervical cancer ([place])   . Colitis   . Depression   . Endometriosis   . GERD (gastroesophageal reflux disease)   . Panic attack    Family History  Problem Relation Age of Onset  . Diabetes Mother   . Hyperlipidemia Mother   . Heart attack Mother   . Lung cancer Mother 60  . Uterine cancer Mother   . Cancer Father        prostate  . Graves' disease Father   . Depression Father   . Anxiety disorder Father   . Crohn's disease Brother   . Dementia Maternal Aunt   .  Colon cancer Maternal Aunt 35       died at 63  . Dementia Maternal Uncle   . Dementia Paternal Grandmother   . Diverticulitis Paternal Grandmother   . Scleroderma Paternal Grandmother   . Bipolar disorder Neg Hx   . Schizophrenia Neg Hx   . OCD Neg Hx   . Alcohol abuse Neg Hx   . Drug abuse Neg Hx   . Esophageal cancer Neg Hx   . Rectal cancer Neg Hx   . Stomach cancer Neg Hx     Outpatient Encounter Medications as of 08/12/2019  Medication Sig  . clobetasol (TEMOVATE) 0.05 % external solution Apply 1 application topically at bedtime. After 2 weeks of use take a break for one week. (Patient not taking: Reported on 07/28/2019)  . clonazePAM (KLONOPIN) 0.5 MG tablet Take 1 tablet (0.5 mg total) by mouth 2 (two) times daily as needed for anxiety.  Marland Kitchen estradiol (VIVELLE-DOT) 0.025 MG/24HR 1 PATCH ONTO THE SKIN EVERY WED AND SAT  . levalbuterol (XOPENEX HFA) 45 MCG/ACT inhaler Inhale 2 puffs into the lungs every 4 (four) hours as needed for wheezing or shortness of breath.  . lidocaine (XYLOCAINE) 5 % ointment Apply 1 application topically as needed.  . meloxicam (MOBIC) 15 MG tablet Take 1 tablet (15 mg total) by mouth daily. (Patient not taking: Reported on 07/28/2019)  . methocarbamol (ROBAXIN) 500 MG tablet Take 1 tablet (500  mg total) by mouth every 6 (six) hours as needed for muscle spasms. (Patient not taking: Reported on 07/28/2019)  . mirtazapine (REMERON) 7.5 MG tablet Take 1 tablet (7.5 mg total) by mouth at bedtime.  Marland Kitchen omeprazole (PRILOSEC) 20 MG capsule Take 1 capsule (20 mg total) by mouth 2 (two) times daily before a meal. After 6 weeks will reduce dose to the lowest effective dose.  . progesterone (PROMETRIUM) 100 MG capsule TAKE 1 CAPSULE BY MOUTH EVERY DAY IN THE EVENING  . promethazine (PHENERGAN) 25 MG tablet Take 1 tablet (25 mg total) by mouth every 8 (eight) hours as needed for nausea or vomiting.  . traZODone (DESYREL) 100 MG tablet Take 1 tablet (100 mg total) by mouth at  bedtime as needed for sleep. Due for follow up visit  . venlafaxine XR (EFFEXOR-XR) 150 MG 24 hr capsule Take 1 capsule (150 mg total) by mouth daily with breakfast.   No facility-administered encounter medications on file as of 08/12/2019.    No results found for this or any previous visit (from the past 2160 hour(s)).  There were no vitals taken for this visit.   Review of Systems  Cardiovascular: Negative for chest pain.  Psychiatric/Behavioral: Positive for depression. Negative for substance abuse.    Mental Status Examination  Appearance: casual Alert: Yes Attention: fair  Cooperative: Yes Eye Contact: Speech: normal tone Psychomotor Activity:  Memory/Concentration: adequate Oriented: person, place, time/date and situation Mood: subdued Affect: Congruent Thought Processes and Associations: Coherent Fund of Knowledge: Fair Thought Content: Suicidal ideation and Homicidal ideation were denied Insight: Fair Judgement: Fair  Diagnosis/ Treatment: Maj. depressive disorder recurrent moderate. Subdued, continue effexor, add remeron7.5mg  qhs for sleep, apetite, depression  Advised to call within a week of status and she agrees    Generalized anxiety disorder : stressed, continue effexor, provided supportive therpapy, will add remeron for depression  Insomnia: reviewed sleep hygiene, continue trazadone  Work on distraction techniques and therapy   Panic disorder: baseline    I discussed the assessment and treatment plan with the patient. The patient was provided an opportunity to ask questions and all were answered. The patient agreed with the plan and demonstrated an understanding of the instructions.   The patient was advised to call back or seek an in-person evaluation if the symptoms worsen or if the condition fails to improve as anticipated.  I provided 49minutes of non-face-to-face time during this encounter. Fu 3-4w or earlier if needed  Virginia Capron,  MD 08/12/2019

## 2019-08-17 ENCOUNTER — Other Ambulatory Visit (HOSPITAL_COMMUNITY): Payer: Self-pay | Admitting: Psychiatry

## 2019-08-17 NOTE — Telephone Encounter (Signed)
Pt needs refill on klonopin [facility]

## 2019-08-17 NOTE — Telephone Encounter (Signed)
Spoke to patient and she will call her pcp to get refill

## 2019-08-17 NOTE — Telephone Encounter (Signed)
She gets from primary care office, can call them for renewal

## 2019-09-05 ENCOUNTER — Other Ambulatory Visit (HOSPITAL_COMMUNITY): Payer: Self-pay | Admitting: Psychiatry

## 2019-09-08 ENCOUNTER — Ambulatory Visit (INDEPENDENT_AMBULATORY_CARE_PROVIDER_SITE_OTHER): Payer: Self-pay | Admitting: Psychiatry

## 2019-09-08 ENCOUNTER — Encounter (HOSPITAL_COMMUNITY): Payer: Self-pay | Admitting: Psychiatry

## 2019-09-08 DIAGNOSIS — F331 Major depressive disorder, recurrent, moderate: Secondary | ICD-10-CM

## 2019-09-08 DIAGNOSIS — F410 Panic disorder [episodic paroxysmal anxiety] without agoraphobia: Secondary | ICD-10-CM

## 2019-09-08 DIAGNOSIS — F411 Generalized anxiety disorder: Secondary | ICD-10-CM

## 2019-09-08 MED ORDER — VENLAFAXINE HCL ER 150 MG PO CP24: 150.0000 mg | ORAL_CAPSULE | Freq: Every day | ORAL | 0 refills | Status: DC

## 2019-09-08 MED ORDER — TRAZODONE HCL 100 MG PO TABS: 100.0000 mg | ORAL_TABLET | Freq: Every evening | ORAL | 0 refills | Status: DC | PRN

## 2019-09-08 NOTE — Progress Notes (Signed)
Patient ID: Virginia Logan, female   DOB: 1971/02/28, 49 y.o.   MRN: [id number] [facility] Follow-up Outpatient Visit Tele psych Comfort Emde [id number] 49 y.o.  09/08/2019  Chief Complaint: depression follow up   History of Present Illness:   Patient returns for Medication Follow up and is diagnosed with Major depressive disorder moderate and generalized anxiety disorder. Panic disorder.   I connected with [name] on 09/08/19 at  2:00 PM EDT by telephone and verified that I am speaking with the correct person using two identifiers.  I discussed the limitations, risks, security and privacy concerns of performing an evaluation and management service by telephone and the availability of in person appointments. I also discussed with the patient that there may be a patient responsible charge related to this service. The patient expressed understanding and agreed to proceed.  Moms house now sold,  She is living in a camper , lady who sold was harrassing but not now remeron made her apetite go too high so she stop  overal doing fair and not depressed as she was last visit  Looking for another rjob  Insomnia: on trazadone  Aggravating factor : past  job stress, housing concern Modifying factors; some friends  Associated symptoms: insomnia at times. No psychosis  Past Medical History:  Diagnosis Date  . Alopecia   . Anemia   . Anxiety   . Cancer (HCC)    cervical cancer, melanoma of vulva  . Cervical cancer ([place])   . Colitis   . Depression   . Endometriosis   . GERD (gastroesophageal reflux disease)   . Panic attack    Family History  Problem Relation Age of Onset  . Diabetes Mother   . Hyperlipidemia Mother   . Heart attack Mother   . Lung cancer Mother 46  . Uterine cancer Mother   . Cancer Father        prostate  . Graves' disease Father   . Depression Father   . Anxiety disorder Father   . Crohn's disease Brother   . Dementia Maternal Aunt    . Colon cancer Maternal Aunt 24       died at 49  . Dementia Maternal Uncle   . Dementia Paternal Grandmother   . Diverticulitis Paternal Grandmother   . Scleroderma Paternal Grandmother   . Bipolar disorder Neg Hx   . Schizophrenia Neg Hx   . OCD Neg Hx   . Alcohol abuse Neg Hx   . Drug abuse Neg Hx   . Esophageal cancer Neg Hx   . Rectal cancer Neg Hx   . Stomach cancer Neg Hx     Outpatient Encounter Medications as of 09/08/2019  Medication Sig  . clobetasol (TEMOVATE) 0.05 % external solution Apply 1 application topically at bedtime. After 2 weeks of use take a break for one week. (Patient not taking: Reported on 07/28/2019)  . clonazePAM (KLONOPIN) 0.5 MG tablet Take 1 tablet (0.5 mg total) by mouth 2 (two) times daily as needed for anxiety.  Marland Kitchen estradiol (VIVELLE-DOT) 0.025 MG/24HR 1 PATCH ONTO THE SKIN EVERY WED AND SAT  . levalbuterol (XOPENEX HFA) 45 MCG/ACT inhaler Inhale 2 puffs into the lungs every 4 (four) hours as needed for wheezing or shortness of breath.  . lidocaine (XYLOCAINE) 5 % ointment Apply 1 application topically as needed.  . meloxicam (MOBIC) 15 MG tablet Take 1 tablet (15 mg total) by mouth daily. (Patient not taking: Reported on 07/28/2019)  . methocarbamol (ROBAXIN)  500 MG tablet Take 1 tablet (500 mg total) by mouth every 6 (six) hours as needed for muscle spasms. (Patient not taking: Reported on 07/28/2019)  . omeprazole (PRILOSEC) 20 MG capsule Take 1 capsule (20 mg total) by mouth 2 (two) times daily before a meal. After 6 weeks will reduce dose to the lowest effective dose.  . progesterone (PROMETRIUM) 100 MG capsule TAKE 1 CAPSULE BY MOUTH EVERY DAY IN THE EVENING  . promethazine (PHENERGAN) 25 MG tablet Take 1 tablet (25 mg total) by mouth every 8 (eight) hours as needed for nausea or vomiting.  . traZODone (DESYREL) 100 MG tablet Take 1 tablet (100 mg total) by mouth at bedtime as needed for sleep. Due for follow up visit  . venlafaxine XR (EFFEXOR-XR) 150  MG 24 hr capsule Take 1 capsule (150 mg total) by mouth daily with breakfast.  . [DISCONTINUED] mirtazapine (REMERON) 7.5 MG tablet TAKE 1 TABLET (7.5 MG TOTAL) BY MOUTH AT BEDTIME.  . [DISCONTINUED] traZODone (DESYREL) 100 MG tablet Take 1 tablet (100 mg total) by mouth at bedtime as needed for sleep. Due for follow up visit  . [DISCONTINUED] venlafaxine XR (EFFEXOR-XR) 150 MG 24 hr capsule Take 1 capsule (150 mg total) by mouth daily with breakfast.   No facility-administered encounter medications on file as of 09/08/2019.    No results found for this or any previous visit (from the past 2160 hour(s)).  There were no vitals taken for this visit.   Review of Systems  Cardiovascular: Negative for chest pain.  Psychiatric/Behavioral: Negative for substance abuse.    Mental Status Examination  Appearance: casual Alert: Yes Attention: fair  Cooperative: Yes Eye Contact: Speech: normal tone Psychomotor Activity:  Memory/Concentration: adequate Oriented: person, place, time/date and situation Mood: fair Affect: Congruent Thought Processes and Associations: Coherent Fund of Knowledge: Fair Thought Content: Suicidal ideation and Homicidal ideation were denied Insight: Fair Judgement: Fair  Diagnosis/ Treatment: Maj. depressive disorder recurrent moderate. Doing fair, continue effexor    Generalized anxiety disorder : some better continue effexor Insomnia: reviewed sleep hygiene, continue trazadone  Work on distraction techniques and therapy   Panic disorder: baseline    I discussed the assessment and treatment plan with the patient. The patient was provided an opportunity to ask questions and all were answered. The patient agreed with the plan and demonstrated an understanding of the instructions.   The patient was advised to call back or seek an in-person evaluation if the symptoms worsen or if the condition fails to improve as anticipated.  I provided 15 minutes of  non-face-to-face time during this encounter. Fu 20m  Merian Capron, MD 09/08/2019

## 2019-10-21 ENCOUNTER — Telehealth (HOSPITAL_COMMUNITY): Payer: Self-pay | Admitting: Psychiatry

## 2019-10-21 NOTE — Telephone Encounter (Signed)
Refill- trazodone 100mg  [facility] [address] [place]

## 2019-10-21 NOTE — Telephone Encounter (Signed)
90 tabs were sent on 4/13  . Should have enough or refill not due

## 2019-10-27 ENCOUNTER — Other Ambulatory Visit (HOSPITAL_COMMUNITY): Payer: Self-pay

## 2019-10-27 MED ORDER — TRAZODONE HCL 50 MG PO TABS: 50.0000 mg | ORAL_TABLET | Freq: Every evening | ORAL | 0 refills | Status: DC | PRN

## 2019-10-27 MED ORDER — VENLAFAXINE HCL ER 75 MG PO CP24: 150.0000 mg | ORAL_CAPSULE | Freq: Every day | ORAL | 0 refills | Status: DC

## 2019-11-03 ENCOUNTER — Other Ambulatory Visit (HOSPITAL_COMMUNITY): Payer: Self-pay | Admitting: Psychiatry

## 2019-11-18 ENCOUNTER — Other Ambulatory Visit (HOSPITAL_COMMUNITY): Payer: Self-pay | Admitting: Psychiatry

## 2019-11-26 ENCOUNTER — Other Ambulatory Visit (HOSPITAL_COMMUNITY): Payer: Self-pay

## 2019-11-26 MED ORDER — VENLAFAXINE HCL ER 75 MG PO CP24: 150.0000 mg | ORAL_CAPSULE | Freq: Every day | ORAL | 0 refills | Status: DC

## 2019-12-04 ENCOUNTER — Other Ambulatory Visit (HOSPITAL_COMMUNITY): Payer: Self-pay

## 2019-12-08 ENCOUNTER — Telehealth (INDEPENDENT_AMBULATORY_CARE_PROVIDER_SITE_OTHER): Payer: 59 | Admitting: Psychiatry

## 2019-12-08 ENCOUNTER — Encounter (HOSPITAL_COMMUNITY): Payer: Self-pay | Admitting: Psychiatry

## 2019-12-08 DIAGNOSIS — F410 Panic disorder [episodic paroxysmal anxiety] without agoraphobia: Secondary | ICD-10-CM | POA: Diagnosis not present

## 2019-12-08 DIAGNOSIS — F331 Major depressive disorder, recurrent, moderate: Secondary | ICD-10-CM | POA: Diagnosis not present

## 2019-12-08 DIAGNOSIS — F411 Generalized anxiety disorder: Secondary | ICD-10-CM | POA: Diagnosis not present

## 2019-12-08 MED ORDER — VENLAFAXINE HCL ER 75 MG PO CP24: 75.0000 mg | ORAL_CAPSULE | Freq: Every day | ORAL | 0 refills | Status: DC

## 2019-12-08 MED ORDER — TRAZODONE HCL 50 MG PO TABS: 50.0000 mg | ORAL_TABLET | Freq: Every evening | ORAL | 0 refills | Status: DC | PRN

## 2019-12-08 NOTE — Progress Notes (Signed)
Patient ID: Virginia Logan, female   DOB: 06/08/1970, 49 y.o.   MRN: [id number] [facility] Follow-up Outpatient Visit Tele psych Kayley Zeiders [id number] 49 y.o.  12/08/2019  Chief Complaint: depression follow up   History of Present Illness:   Patient returns for Medication Follow up and is diagnosed with Major depressive disorder moderate and generalized anxiety disorder. Panic disorder.     I connected with [name] on 12/08/19 at  4:30 PM EDT by telephone and verified that I am speaking with the correct person using two identifiers. I discussed the limitations, risks, security and privacy concerns of performing an evaluation and management service by telephone and the availability of in person appointments. I also discussed with the patient that there may be a patient responsible charge related to this service. The patient expressed understanding and agreed to proceed.  Patient location: car parked Provider location : home  Not living in camper , got an apartment and a job  Doing fair, have cut down effexor to 75mg  one a day and trazadone to 50mg  qhs Says helped to reduce apetite Feels hormones were effecting mood as well.    Insomnia: on trazadone  Aggravating factor : past job stressors, housing concern Modifying factors; some friends  Associated symptoms: insomnia at times. No psychosis  Past Medical History:  Diagnosis Date  . Alopecia   . Anemia   . Anxiety   . Cancer (HCC)    cervical cancer, melanoma of vulva  . Cervical cancer ([place])   . Colitis   . Depression   . Endometriosis   . GERD (gastroesophageal reflux disease)   . Panic attack    Family History  Problem Relation Age of Onset  . Diabetes Mother   . Hyperlipidemia Mother   . Heart attack Mother   . Lung cancer Mother 36  . Uterine cancer Mother   . Cancer Father        prostate  . Graves' disease Father   . Depression Father   . Anxiety disorder Father   . Crohn's  disease Brother   . Dementia Maternal Aunt   . Colon cancer Maternal Aunt 42       died at 26  . Dementia Maternal Uncle   . Dementia Paternal Grandmother   . Diverticulitis Paternal Grandmother   . Scleroderma Paternal Grandmother   . Bipolar disorder Neg Hx   . Schizophrenia Neg Hx   . OCD Neg Hx   . Alcohol abuse Neg Hx   . Drug abuse Neg Hx   . Esophageal cancer Neg Hx   . Rectal cancer Neg Hx   . Stomach cancer Neg Hx     Outpatient Encounter Medications as of 12/08/2019  Medication Sig  . clobetasol (TEMOVATE) 0.05 % external solution Apply 1 application topically at bedtime. After 2 weeks of use take a break for one week. (Patient not taking: Reported on 07/28/2019)  . clonazePAM (KLONOPIN) 0.5 MG tablet Take 1 tablet (0.5 mg total) by mouth 2 (two) times daily as needed for anxiety.  Marland Kitchen estradiol (VIVELLE-DOT) 0.025 MG/24HR 1 PATCH ONTO THE SKIN EVERY WED AND SAT  . levalbuterol (XOPENEX HFA) 45 MCG/ACT inhaler Inhale 2 puffs into the lungs every 4 (four) hours as needed for wheezing or shortness of breath.  . lidocaine (XYLOCAINE) 5 % ointment Apply 1 application topically as needed.  . meloxicam (MOBIC) 15 MG tablet Take 1 tablet (15 mg total) by mouth daily. (Patient not taking: Reported on 07/28/2019)  .  methocarbamol (ROBAXIN) 500 MG tablet Take 1 tablet (500 mg total) by mouth every 6 (six) hours as needed for muscle spasms. (Patient not taking: Reported on 07/28/2019)  . omeprazole (PRILOSEC) 20 MG capsule Take 1 capsule (20 mg total) by mouth 2 (two) times daily before a meal. After 6 weeks will reduce dose to the lowest effective dose.  . progesterone (PROMETRIUM) 100 MG capsule TAKE 1 CAPSULE BY MOUTH EVERY DAY IN THE EVENING  . promethazine (PHENERGAN) 25 MG tablet Take 1 tablet (25 mg total) by mouth every 8 (eight) hours as needed for nausea or vomiting.  . traZODone (DESYREL) 50 MG tablet Take 1 tablet (50 mg total) by mouth at bedtime as needed for sleep. Due for follow  up visit  . venlafaxine XR (EFFEXOR-XR) 75 MG 24 hr capsule Take 1 capsule (75 mg total) by mouth daily with breakfast.  . [DISCONTINUED] mirtazapine (REMERON) 7.5 MG tablet TAKE 1 TABLET (7.5 MG TOTAL) BY MOUTH AT BEDTIME.  . [DISCONTINUED] traZODone (DESYREL) 50 MG tablet TAKE 1 TABLET (50 MG TOTAL) BY MOUTH AT BEDTIME AS NEEDED FOR SLEEP. DUE FOR FOLLOW UP VISIT  . [DISCONTINUED] venlafaxine XR (EFFEXOR-XR) 75 MG 24 hr capsule Take 2 capsules (150 mg total) by mouth daily with breakfast.   No facility-administered encounter medications on file as of 12/08/2019.    No results found for this or any previous visit (from the past 2160 hour(s)).  There were no vitals taken for this visit.   Review of Systems  Cardiovascular: Negative for chest pain.  Psychiatric/Behavioral: Negative for substance abuse.    Mental Status Examination  Appearance: casual Alert: Yes Attention: fair  Cooperative: Yes Eye Contact: Speech: normal tone Psychomotor Activity:  Memory/Concentration: adequate Oriented: person, place, time/date and situation Mood: fair Affect: Congruent Thought Processes and Associations: Coherent Fund of Knowledge: Fair Thought Content: Suicidal ideation and Homicidal ideation were denied Insight: Fair Judgement: Fair  Diagnosis/ Treatment: Maj. depressive disorder recurrent moderate. Doing fair, now on effexor 80m, remains balance    Generalized anxiety disorder : manageable,  continue effexor Insomnia: reviewed sleep hygiene, continue trazadone  Work on distraction techniques and therapy   Panic disorder: baseline    I discussed the assessment and treatment plan with the patient. The patient was provided an opportunity to ask questions and all were answered. The patient agreed with the plan and demonstrated an understanding of the instructions.   The patient was advised to call back or seek an in-person evaluation if the symptoms worsen or if the condition fails  to improve as anticipated.  I provided 15 minutes of non-face-to-face time during this encounter. Fu 47m  Merian Capron, MD 12/08/2019

## 2019-12-21 ENCOUNTER — Other Ambulatory Visit (HOSPITAL_COMMUNITY): Payer: Self-pay

## 2019-12-21 MED ORDER — TRAZODONE HCL 50 MG PO TABS: 50.0000 mg | ORAL_TABLET | Freq: Every evening | ORAL | 0 refills | Status: DC | PRN

## 2019-12-23 ENCOUNTER — Other Ambulatory Visit (HOSPITAL_COMMUNITY): Payer: Self-pay

## 2019-12-23 MED ORDER — VENLAFAXINE HCL ER 75 MG PO CP24: 75.0000 mg | ORAL_CAPSULE | Freq: Every day | ORAL | 0 refills | Status: DC

## 2020-01-11 ENCOUNTER — Other Ambulatory Visit: Payer: Self-pay

## 2020-01-11 ENCOUNTER — Encounter: Payer: Self-pay | Admitting: Family Medicine

## 2020-01-11 ENCOUNTER — Ambulatory Visit (INDEPENDENT_AMBULATORY_CARE_PROVIDER_SITE_OTHER): Payer: 59 | Admitting: Family Medicine

## 2020-01-11 VITALS — BP 100/70 | HR 92 | Ht 64.0 in | Wt 167.0 lb

## 2020-01-11 DIAGNOSIS — Z780 Asymptomatic menopausal state: Secondary | ICD-10-CM

## 2020-01-11 DIAGNOSIS — Z0000 Encounter for general adult medical examination without abnormal findings: Secondary | ICD-10-CM

## 2020-01-11 DIAGNOSIS — N912 Amenorrhea, unspecified: Secondary | ICD-10-CM | POA: Diagnosis not present

## 2020-01-11 NOTE — Patient Instructions (Signed)

## 2020-01-11 NOTE — Progress Notes (Signed)
Subjective:     Virginia Logan is a 49 y.o. female and is here for a comprehensive physical exam. The patient reports problems - she is doing OK. she started a new job recently, working from home and at night.    She has been seeing Dr. De Nurse, psychiatry downstairs and has been on Effexor and trazodone for quite some time she feels very stable on her regimen and would like to transition those medications back to [place] if at all possible.  Would like to get some updated blood work done today as well.  Also had questions today about the Covid vaccine.  She also stopped her hormone therapy about a month ago.  She was just not feeling well and thought that it might be contributing she is actually lost which she felt like was too much weight over the last year but now she has gained some of it back since coming off the hormones she feels like maybe they were little too "strong".  Since coming off the medication she has noticed again the weight gain, joint achiness, increased fatigue and low energy and change in sleep quality.  She says she also noticed that she been getting some cysts in the skin in her groin which was the other reason she had stopped the hormones thinking that it might actually be triggering those they said that they were quite painful.    Social History   Socioeconomic History  . Marital status: Divorced    Spouse name: Not on file  . Number of children: Not on file  . Years of education: Not on file  . Highest education level: Not on file  Occupational History  . Occupation: Chartered certified accountant for [employer]  . Smoking status: Former Smoker    Packs/day: 0.30    Types: Cigarettes    Quit date: 08/20/2013    Years since quitting: 6.3  . Smokeless tobacco: Never Used  Vaping Use  . Vaping Use: Never used  Substance and Sexual Activity  . Alcohol use: No  . Drug use: No    Comment: Caffeine: Coffee 2-3 cupsper day.   Marland Kitchen Sexual activity: Yes    Partners: Male     Comment: Patient denies sexual side effects.  Other Topics Concern  . Not on file  Social History Narrative  . Not on file   Social Determinants of Health   Financial Resource Strain:   . Difficulty of Paying Living Expenses:   Food Insecurity:   . Worried About Charity fundraiser in the Last Year:   . Arboriculturist in the Last Year:   Transportation Needs:   . Film/video editor (Medical):   Marland Kitchen Lack of Transportation (Non-Medical):   Physical Activity:   . Days of Exercise per Week:   . Minutes of Exercise per Session:   Stress:   . Feeling of Stress :   Social Connections:   . Frequency of Communication with Friends and Family:   . Frequency of Social Gatherings with Friends and Family:   . Attends Religious Services:   . Active Member of Clubs or Organizations:   . Attends Archivist Meetings:   Marland Kitchen Marital Status:   Intimate Partner Violence:   . Fear of Current or Ex-Partner:   . Emotionally Abused:   Marland Kitchen Physically Abused:   . Sexually Abused:    Health Maintenance  Topic Date Due  . Hepatitis C Screening  Never done  . MAMMOGRAM  10/24/2017  . INFLUENZA VACCINE  12/27/2019  . COVID-19 Vaccine (1) 01/10/2021 (Originally 07/17/1980)  . PAP SMEAR-Modifier  07/02/2021  . TETANUS/TDAP  11/27/2025  . COLONOSCOPY  10/20/2028  . HIV Screening  Completed    The following portions of the patient's history were reviewed and updated as appropriate: allergies, current medications, past family history, past medical history, past social history, past surgical history and problem list.  Review of Systems A comprehensive review of systems was negative.   Objective:    BP 100/70   Pulse 92   Ht 5\' 4"  (1.626 m)   Wt 167 lb (75.8 kg)   SpO2 99%   BMI 28.67 kg/m  General appearance: alert, cooperative and appears stated age Head: Normocephalic, without obvious abnormality, atraumatic Eyes: conj clear, EOMI, PEERLA Ears: normal TM's and external ear canals  both ears Nose: Nares normal. Septum midline. Mucosa normal. No drainage or sinus tenderness. Throat: lips, mucosa, and tongue normal; teeth and gums normal Neck: no adenopathy, no carotid bruit, no JVD, supple, symmetrical, trachea midline and thyroid not enlarged, symmetric, no tenderness/mass/nodules Back: symmetric, no curvature. ROM normal. No CVA tenderness. Lungs: wheezing at the lung bases bilat Heart: regular rate and rhythm, S1, S2 normal, no murmur, click, rub or gallop Abdomen: soft, non-tender; bowel sounds normal; no masses,  no organomegaly Extremities: extremities normal, atraumatic, no cyanosis or edema Pulses: 2+ and symmetric Skin: Skin color, texture, turgor normal. No rashes or lesions Lymph nodes: Cervical, supraclavicular, and axillary nodes normal. Neurologic: Alert and oriented X 3, normal strength and tone. Normal symmetric reflexes. Normal coordination and gait    Assessment:    Healthy female exam.      Plan:     See After Visit Summary for Counseling Recommendations   Keep up a regular exercise program and make sure you are eating a healthy diet Try to eat 4 servings of dairy a day, or if you are lactose intolerant take a calcium with vitamin D daily.  Your vaccines are up to date.   She asked if I would take over if I would mind taking over her psychiatric medications which include venlafaxine and trazodone.  She says she has been on maintenance therapy that she did recently increase her trazodone 200 mg.  Ever since she came off of the hormone replacement therapy she is just not been sleeping as well.  Postmenopausal symptoms/HRT -she would like to recheck her hormones now that she has been off of the medication for a month to see what her baseline is.  We discussed possibly restarting her at a much lower dose than she was on previously. I don't think this was the cause of her cysts but consider could be.   Recommended COVID vaccine.

## 2020-01-12 LAB — CBC
HCT: 44.1 % (ref 35.0–45.0)
Hemoglobin: 15.3 g/dL (ref 11.7–15.5)
MCH: 31.2 pg (ref 27.0–33.0)
MCHC: 34.7 g/dL (ref 32.0–36.0)
MCV: 90 fL (ref 80.0–100.0)
MPV: 10.5 fL (ref 7.5–12.5)
Platelets: 237 10*3/uL (ref 140–400)
RBC: 4.9 10*6/uL (ref 3.80–5.10)
RDW: 12 % (ref 11.0–15.0)
WBC: 7 10*3/uL (ref 3.8–10.8)

## 2020-01-12 LAB — LUTEINIZING HORMONE: LH: 32.1 m[IU]/mL

## 2020-01-12 LAB — ESTRADIOL: Estradiol: 17 pg/mL

## 2020-01-12 LAB — COMPLETE METABOLIC PANEL WITH GFR
AG Ratio: 1.6 (calc) (ref 1.0–2.5)
ALT: 14 U/L (ref 6–29)
AST: 17 U/L (ref 10–35)
Albumin: 4.5 g/dL (ref 3.6–5.1)
Alkaline phosphatase (APISO): 84 U/L (ref 37–153)
BUN: 16 mg/dL (ref 7–25)
CO2: 28 mmol/L (ref 20–32)
Calcium: 9.7 mg/dL (ref 8.6–10.4)
Chloride: 101 mmol/L (ref 98–110)
Creat: 0.96 mg/dL (ref 0.50–1.05)
GFR, Est African American: 79 mL/min/{1.73_m2} (ref >=60)
GFR, Est Non African American: 68 mL/min/{1.73_m2} (ref >=60)
Globulin: 2.8 g/dL (calc) (ref 1.9–3.7)
Glucose, Bld: 83 mg/dL (ref 65–99)
Potassium: 3.9 mmol/L (ref 3.5–5.3)
Sodium: 138 mmol/L (ref 135–146)
Total Bilirubin: 0.5 mg/dL (ref 0.2–1.2)
Total Protein: 7.3 g/dL (ref 6.1–8.1)

## 2020-01-12 LAB — LIPID PANEL
Cholesterol: 226 mg/dL — ABNORMAL HIGH (ref <200)
HDL: 59 mg/dL (ref >=50)
LDL Cholesterol (Calc): 136 mg/dL (calc) — ABNORMAL HIGH
Non-HDL Cholesterol (Calc): 167 mg/dL (calc) — ABNORMAL HIGH (ref <130)
Total CHOL/HDL Ratio: 3.8 (calc) (ref <5.0)
Triglycerides: 174 mg/dL — ABNORMAL HIGH (ref <150)

## 2020-01-12 LAB — PROGESTERONE: Progesterone: 0.5 ng/mL

## 2020-01-12 LAB — FOLLICLE STIMULATING HORMONE: FSH: 43.8 m[IU]/mL

## 2020-01-12 LAB — TSH: TSH: 1.7 mIU/L

## 2020-01-13 ENCOUNTER — Encounter: Payer: Self-pay | Admitting: Family Medicine

## 2020-01-15 ENCOUNTER — Encounter: Payer: Self-pay | Admitting: Family Medicine

## 2020-01-18 ENCOUNTER — Encounter: Payer: Self-pay | Admitting: Family Medicine

## 2020-01-18 MED ORDER — LEVALBUTEROL TARTRATE 45 MCG/ACT IN AERO: 2.0000 | INHALATION_SPRAY | RESPIRATORY_TRACT | 1 refills | Status: DC | PRN

## 2020-01-18 MED ORDER — CLONAZEPAM 0.5 MG PO TABS: 0.5000 mg | ORAL_TABLET | Freq: Every day | ORAL | 0 refills | Status: DC | PRN

## 2020-01-18 MED ORDER — METHOCARBAMOL 500 MG PO TABS: 500.0000 mg | ORAL_TABLET | Freq: Four times a day (QID) | ORAL | 0 refills | Status: DC | PRN

## 2020-01-18 MED ORDER — TRAZODONE HCL 50 MG PO TABS: 50.0000 mg | ORAL_TABLET | Freq: Every evening | ORAL | 1 refills | Status: DC | PRN

## 2020-01-18 MED ORDER — ONDANSETRON 4 MG PO TBDP: ORAL_TABLET | ORAL | 0 refills | Status: DC

## 2020-01-18 MED ORDER — ESTRADIOL 0.025 MG/24HR TD PTTW: 1.0000 | MEDICATED_PATCH | TRANSDERMAL | 12 refills | Status: DC

## 2020-01-19 ENCOUNTER — Encounter: Payer: Self-pay | Admitting: Family Medicine

## 2020-01-19 MED ORDER — LEVALBUTEROL TARTRATE 45 MCG/ACT IN AERO: 2.0000 | INHALATION_SPRAY | RESPIRATORY_TRACT | 1 refills | Status: DC | PRN

## 2020-01-19 MED ORDER — ESTRADIOL 0.025 MG/24HR TD PTTW
1.0000 | MEDICATED_PATCH | TRANSDERMAL | 12 refills | Status: DC
Start: 2020-01-21 — End: 2020-01-21

## 2020-01-19 MED ORDER — CLONAZEPAM 0.5 MG PO TABS: 0.5000 mg | ORAL_TABLET | Freq: Every day | ORAL | 0 refills | Status: DC | PRN

## 2020-01-19 MED ORDER — METHOCARBAMOL 500 MG PO TABS
500.0000 mg | ORAL_TABLET | Freq: Four times a day (QID) | ORAL | 0 refills | Status: DC | PRN
Start: 2020-01-19 — End: 2021-03-22

## 2020-01-19 MED ORDER — ONDANSETRON 4 MG PO TBDP
ORAL_TABLET | ORAL | 0 refills | Status: DC
Start: 2020-01-19 — End: 2021-03-22

## 2020-01-19 NOTE — Telephone Encounter (Signed)
5 scripts sent to [facility]

## 2020-01-20 ENCOUNTER — Encounter: Payer: Self-pay | Admitting: Family Medicine

## 2020-01-20 ENCOUNTER — Other Ambulatory Visit: Payer: Self-pay | Admitting: Family Medicine

## 2020-01-20 MED ORDER — PROGESTERONE MICRONIZED 100 MG PO CAPS: 100.0000 mg | ORAL_CAPSULE | Freq: Every evening | ORAL | 5 refills | Status: DC | PRN

## 2020-01-21 MED ORDER — VIVELLE-DOT 0.025 MG/24HR TD PTTW: 1.0000 | MEDICATED_PATCH | TRANSDERMAL | 12 refills | Status: DC

## 2020-01-21 NOTE — Telephone Encounter (Signed)
New rx sent for brand only

## 2020-01-25 ENCOUNTER — Other Ambulatory Visit: Payer: Self-pay | Admitting: *Deleted

## 2020-01-25 MED ORDER — OMEPRAZOLE 20 MG PO CPDR: 20.0000 mg | DELAYED_RELEASE_CAPSULE | Freq: Two times a day (BID) | ORAL | 3 refills | Status: DC

## 2020-01-25 MED ORDER — TRAZODONE HCL 50 MG PO TABS: 50.0000 mg | ORAL_TABLET | Freq: Every evening | ORAL | 1 refills | Status: DC | PRN

## 2020-01-25 MED ORDER — CLOBETASOL PROPIONATE 0.05 % EX SOLN: 1.0000 "application " | Freq: Every day | CUTANEOUS | 0 refills | Status: DC

## 2020-02-23 MED ORDER — ESTRADIOL 0.025 MG/24HR TD PTTW: 1.0000 | MEDICATED_PATCH | TRANSDERMAL | 12 refills | Status: DC

## 2020-02-23 NOTE — Telephone Encounter (Signed)
New rx sent for generic.

## 2020-04-11 ENCOUNTER — Telehealth (HOSPITAL_COMMUNITY): Payer: 59 | Admitting: Psychiatry

## 2020-04-14 ENCOUNTER — Other Ambulatory Visit (HOSPITAL_COMMUNITY): Payer: Self-pay | Admitting: Psychiatry

## 2020-05-12 ENCOUNTER — Encounter: Payer: Self-pay | Admitting: Family Medicine

## 2020-05-13 ENCOUNTER — Other Ambulatory Visit: Payer: Self-pay

## 2020-05-13 ENCOUNTER — Encounter: Payer: Self-pay | Admitting: Physician Assistant

## 2020-05-13 ENCOUNTER — Ambulatory Visit (INDEPENDENT_AMBULATORY_CARE_PROVIDER_SITE_OTHER): Payer: Self-pay | Admitting: Physician Assistant

## 2020-05-13 VITALS — BP 135/73 | HR 88 | Ht 64.0 in | Wt 188.0 lb

## 2020-05-13 DIAGNOSIS — N898 Other specified noninflammatory disorders of vagina: Secondary | ICD-10-CM

## 2020-05-13 DIAGNOSIS — N9089 Other specified noninflammatory disorders of vulva and perineum: Secondary | ICD-10-CM

## 2020-05-13 MED ORDER — CLOBETASOL PROPIONATE 0.05 % EX CREA: 1.0000 "application " | TOPICAL_CREAM | Freq: Two times a day (BID) | CUTANEOUS | 1 refills | Status: DC

## 2020-05-13 NOTE — Patient Instructions (Signed)

## 2020-05-13 NOTE — Progress Notes (Deleted)
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

## 2020-05-16 ENCOUNTER — Encounter: Payer: Self-pay | Admitting: Physician Assistant

## 2020-05-16 ENCOUNTER — Other Ambulatory Visit: Payer: Self-pay | Admitting: Physician Assistant

## 2020-05-16 MED ORDER — FLUCONAZOLE 150 MG PO TABS: 150.0000 mg | ORAL_TABLET | Freq: Once | ORAL | 0 refills | Status: AC

## 2020-05-16 NOTE — Progress Notes (Signed)
There was some yeast seen on wet prep as well. I will send over diflucan but do not think that is the only reason for your symptoms.

## 2020-05-17 ENCOUNTER — Encounter: Payer: Self-pay | Admitting: Physician Assistant

## 2020-05-17 NOTE — Progress Notes (Signed)
Subjective:    Patient ID: Virginia Logan, female    DOB: 17-Oct-1970, 49 y.o.   MRN: [id number]  HPI  Pt is a 49 yo female who presents to the clinic with vaginal irritation and itching. 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 She just saw GYN 3 months ago and was told she had a cyst but it also did not feel like this. Last week she started with itching and then lots of vaginal tenderness over entire vaginal area. No significant discharge. She is so itchy she has bled from scratching. No medication changes or lotions/lubrications changes. She has not been recently sexually active but has in the last 3 months. Not tried anything to make better. No dysuria or abdominal pain. No fever or chills.   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    .. 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  Objective:   Physical Exam Vitals reviewed.  Constitutional:      Appearance: She is obese.  Cardiovascular:     Rate and Rhythm: Normal rate and  regular rhythm.     Pulses: Normal pulses.  Pulmonary:     Effort: Pulmonary effort is normal.  Abdominal:     General: Abdomen is flat. There is no distension.     Palpations: Abdomen is soft.     Tenderness: There is no abdominal tenderness. There is no right CVA tenderness, left CVA tenderness or guarding.  Genitourinary:    Comments: Vulvar swollen and pale with erythematous clitoris there is area of white plaque and almost cracked in place. Very tender to palpation.  Neurological:     General: No focal deficit present.     Mental Status: She is alert and oriented to person, place, and time.  Psychiatric:        Mood and Affect: Mood normal.           Assessment & Plan:  Marland KitchenMarland KitchenDiagnoses and all orders for this visit:  Vaginal itching -     SureSwab, Vaginosis/Vaginitis Plus -     WET PREP FOR TRICH, YEAST, CLUE -     SureSwab HSV, Type 1/2 DNA, PCR -     clobetasol cream (TEMOVATE) 0.05 %; Apply 1 application topically 2 (two) times daily. Apply to external vulva.  Vaginal irritation -     SureSwab, Vaginosis/Vaginitis Plus -     WET PREP FOR TRICH, YEAST, CLUE -  SureSwab HSV, Type 1/2 DNA, PCR -     clobetasol cream (TEMOVATE) 0.05 %; Apply 1 application topically 2 (two) times daily. Apply to external vulva.  Swelling of vulva -     SureSwab, Vaginosis/Vaginitis Plus -     WET PREP FOR TRICH, YEAST, CLUE -     clobetasol cream (TEMOVATE) 0.05 %; Apply 1 application topically 2 (two) times daily. Apply to external vulva.   Almost appears like lichen sclerosis vaginally. Then entire vulva area is swollen and pale. Will do sure swab to test for STD, yeast, BV, Trich, herpes. I want to start topical steroid to see if will help. Follow up with [facility] in 2 weeks. Cool compress. Tylenol and ibuprofen for pain inflammation. No signs of abscess formation. No masses.

## 2020-05-18 ENCOUNTER — Telehealth: Payer: Self-pay

## 2020-05-18 ENCOUNTER — Encounter: Payer: Self-pay | Admitting: Family Medicine

## 2020-05-18 ENCOUNTER — Other Ambulatory Visit: Payer: Self-pay | Admitting: Family Medicine

## 2020-05-18 LAB — WET PREP FOR TRICH, YEAST, CLUE
MICRO NUMBER:: 11334815
Specimen Quality: ADEQUATE

## 2020-05-18 LAB — SURESWAB, VAGINOSIS/VAGINITIS PLUS
Atopobium vaginae: NOT DETECTED Log (cells/mL)
C. albicans, DNA: DETECTED — AB
C. glabrata, DNA: NOT DETECTED
C. parapsilosis, DNA: NOT DETECTED
C. trachomatis RNA, TMA: NOT DETECTED
C. tropicalis, DNA: NOT DETECTED
Gardnerella vaginalis: <4.7 Log (cells/mL)
LACTOBACILLUS SPECIES: NOT DETECTED Log (cells/mL)
MEGASPHAERA SPECIES: NOT DETECTED Log (cells/mL)
N. gonorrhoeae RNA, TMA: NOT DETECTED
Trichomonas vaginalis RNA: NOT DETECTED

## 2020-05-18 NOTE — Telephone Encounter (Signed)
Alternative Requested:NOT COMING IN. CAN CHANGE PRESCRIPTION TO PATCHED FOR 1/WEEK.

## 2020-05-18 NOTE — Telephone Encounter (Signed)
OK, will await pharmacy recommendation.  In regards to swelling, this is unusual.  Lack of hormones causes the tissue to atrophy or shrink, not swell, If she is having swelling and irritation then it is possibly a yeast infection.

## 2020-05-18 NOTE — Telephone Encounter (Signed)
Pt called stating that the pharmacy informed her about a patch that she can do once (1) weekly instead of TWICE weekly.  Pt advised to call pharmacy and have them to send a fax about the medication change.  She also stated that she wonders if the vulva swelling has anything to do with her hormones and maybe that she's not getting enough? She asked if there was something else that she could use for this. She is ok with waiting until Dr. Madilyn Fireman comes back to handle this.

## 2020-05-18 NOTE — Telephone Encounter (Signed)
Virginia Logan left a message requesting a call back from [place].

## 2020-05-19 ENCOUNTER — Other Ambulatory Visit (HOSPITAL_COMMUNITY): Payer: Self-pay | Admitting: Psychiatry

## 2020-05-19 NOTE — Telephone Encounter (Signed)
Will advise pt of recommendations.

## 2020-05-19 NOTE — Progress Notes (Signed)
Negative for BV or STD.

## 2020-05-24 ENCOUNTER — Other Ambulatory Visit (HOSPITAL_COMMUNITY): Payer: Self-pay | Admitting: Psychiatry

## 2020-05-24 NOTE — Telephone Encounter (Signed)
It looks like we already sent the once a week estradiol patch to the pharmacy.  We may need to call them directly so I am confused.  Unless this is already taking care of back from the 22nd.

## 2020-05-25 LAB — SURESWAB HSV, TYPE 1/2 DNA, PCR
HSV 1 DNA: NOT DETECTED
HSV 2 DNA: NOT DETECTED

## 2020-05-26 ENCOUNTER — Telehealth (INDEPENDENT_AMBULATORY_CARE_PROVIDER_SITE_OTHER): Payer: Self-pay | Admitting: Family Medicine

## 2020-05-26 ENCOUNTER — Encounter: Payer: Self-pay | Admitting: Family Medicine

## 2020-05-26 VITALS — Ht 64.0 in | Wt 188.0 lb

## 2020-05-26 DIAGNOSIS — R5383 Other fatigue: Secondary | ICD-10-CM

## 2020-05-26 DIAGNOSIS — L659 Nonscarring hair loss, unspecified: Secondary | ICD-10-CM

## 2020-05-26 DIAGNOSIS — F418 Other specified anxiety disorders: Secondary | ICD-10-CM

## 2020-05-26 DIAGNOSIS — G4710 Hypersomnia, unspecified: Secondary | ICD-10-CM

## 2020-05-26 DIAGNOSIS — M791 Myalgia, unspecified site: Secondary | ICD-10-CM

## 2020-05-26 DIAGNOSIS — N898 Other specified noninflammatory disorders of vagina: Secondary | ICD-10-CM

## 2020-05-26 NOTE — Progress Notes (Signed)
Virginia Logan,   Sure swab showed no herpes component.

## 2020-05-26 NOTE — Progress Notes (Signed)
Still having extreme fatigue an muscular pain.    She still has a spot that is slowly healing on her vulva. She stated that its not as bad as it was and the steroid cream has helped.

## 2020-05-26 NOTE — Progress Notes (Signed)
Virtual Visit via Video Note  I connected with Virginia Logan on 05/26/20 at  9:10 AM EST by a video enabled telemedicine application and verified that I am speaking with the correct person using two identifiers.   I discussed the limitations of evaluation and management by telemedicine and the availability of in person appointments. The patient expressed understanding and agreed to proceed.  Patient location: at home.  Provider location: in office  Subjective:    CC: Fatigue  HPI: C/O extreme fatigue and diffuse muscle pain.  She has had it on and off before though seems particularly worse right now.  To the point where she feels like she is having difficulty functioning on the day today.  She is currently unemployed.  She even wonders if she should consider applying for disability at this point.  She feels like she is sleeping excessively.  Does not know if she snores or not.  The muscle pain is diffuse its not localized to just upper body or lower body or one side of the body.  He does feel like she is suffering from some depression but really feels like the fatigue and muscle pain is actually making her depression worse.  Noticed some skin change color in her vaginal area for about 3-4 weeks.  Still one area that is still puffy and suspect. She feels it is better than it was but not going away.  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   Past medical history, Surgical history, Family history not pertinant except as noted below, Social history, Allergies, and medications have been entered into the medical record, reviewed, and corrections made.   Review of Systems: No fevers, chills, night sweats, weight loss, chest pain, or shortness of breath.   Objective:    General: Speaking clearly in complete sentences without any shortness of breath.  Alert and oriented x3.  Normal judgment. No apparent acute  distress.    Impression and Recommendations:    Hair loss Reports her hairloss is better than it was when she was here last time. She would like to have her ANA checked again.  It was positive several years ago but then on repeat about 3 years ago it was negative.  Depression with anxiety Stable.  May be some mild exacerbation with increased fatigue and myalgias.   Fatigue/myalgias-consider fibromyalgia as a possible diagnosis would like to do some additional lab work to rule out other causes such as deficiencies or thyroid disorder, lupus etc.  If all negative then we will have her complete the form for fibromyalgia we will have her pick it up at the front.  She can fill that out and get it back to [place] at her convenience.  She says she will likely come for blood work next week.  Vaginal lesion-encouraged her to get back in with GYN.  Is been there for a little over a month she says it is getting a little bit better so certainly it might be worth giving it a couple more weeks to see if it continues to heal but if not then I strongly advised that she have it biopsied.  She would like to see Dr. [name] in [facility] who dealt with the original lesion during her pregnancy.  Referral placed.   Time spent in encounter 30 minutes  I discussed the assessment and treatment plan with the patient. The patient was provided an opportunity to ask questions and  all were answered. The patient agreed with the plan and demonstrated an understanding of the instructions.   The patient was advised to call back or seek an in-person evaluation if the symptoms worsen or if the condition fails to improve as anticipated.   Beatrice Lecher, MD

## 2020-05-26 NOTE — Assessment & Plan Note (Signed)
Reports her hairloss is better than it was when she was here last time. She would like to have her ANA checked again.  It was positive several years ago but then on repeat about 3 years ago it was negative.

## 2020-05-26 NOTE — Assessment & Plan Note (Signed)
Stable.  May be some mild exacerbation with increased fatigue and myalgias.

## 2020-05-31 NOTE — Telephone Encounter (Signed)
Spoke with pharmacy and they confirmed Chelse picked up the patches on 05/18/20.  It was switched to the once a week and Aldea received 4 packs.

## 2020-06-14 ENCOUNTER — Other Ambulatory Visit: Payer: Self-pay | Admitting: Family Medicine

## 2020-06-21 ENCOUNTER — Other Ambulatory Visit (HOSPITAL_COMMUNITY): Payer: Self-pay | Admitting: Psychiatry

## 2020-06-27 ENCOUNTER — Encounter: Payer: Self-pay | Admitting: Family Medicine

## 2020-06-30 NOTE — Telephone Encounter (Signed)
I think if we pair down to a minimum we can do  Sed rate, B12, Mag, ANA

## 2020-07-04 ENCOUNTER — Encounter: Payer: Self-pay | Admitting: Family Medicine

## 2020-07-04 DIAGNOSIS — M791 Myalgia, unspecified site: Secondary | ICD-10-CM

## 2020-07-04 DIAGNOSIS — R5383 Other fatigue: Secondary | ICD-10-CM

## 2020-07-04 DIAGNOSIS — G4710 Hypersomnia, unspecified: Secondary | ICD-10-CM

## 2020-07-05 ENCOUNTER — Encounter: Payer: Self-pay | Admitting: Family Medicine

## 2020-07-05 MED ORDER — VENLAFAXINE HCL ER 75 MG PO CP24: 75.0000 mg | ORAL_CAPSULE | Freq: Every day | ORAL | 1 refills | Status: DC

## 2020-07-05 NOTE — Telephone Encounter (Signed)
Yes it is an internal lab for [facility] so I don't know of way to order and schedule her there.  We can order for [facility] if that is cheaper than [facility].

## 2020-07-05 NOTE — Telephone Encounter (Signed)
Effexor sent.  There is a vaginal ring for hormones if she wants to try it.

## 2020-07-08 NOTE — Telephone Encounter (Signed)
Med pended. Not sure is eh wants local or [facility].

## 2020-07-11 ENCOUNTER — Telehealth: Payer: Self-pay | Admitting: Family Medicine

## 2020-07-11 MED ORDER — ESTRADIOL 2 MG VA RING: 2.0000 mg | VAGINAL_RING | VAGINAL | 1 refills | Status: DC

## 2020-07-11 NOTE — Telephone Encounter (Signed)
Patient called and left a voicemail for Kenney Houseman to call her back [phone] she has a question about her Hormone Medication

## 2020-07-11 NOTE — Telephone Encounter (Signed)
Med sent. I was just waiting to hear what pharmacy she wanted.

## 2020-08-28 ENCOUNTER — Other Ambulatory Visit: Payer: Self-pay | Admitting: Family Medicine

## 2020-09-20 ENCOUNTER — Encounter: Payer: Self-pay | Admitting: Family Medicine

## 2020-09-20 MED ORDER — FLUCONAZOLE 150 MG PO TABS: 150.0000 mg | ORAL_TABLET | Freq: Once | ORAL | 0 refills | Status: AC

## 2020-10-14 ENCOUNTER — Encounter: Payer: Self-pay | Admitting: Family Medicine

## 2020-10-14 ENCOUNTER — Other Ambulatory Visit: Payer: Self-pay

## 2020-10-14 ENCOUNTER — Other Ambulatory Visit (HOSPITAL_COMMUNITY)
Admission: RE | Admit: 2020-10-14 | Discharge: 2020-10-14 | Disposition: A | Discharge status: Home / Self Care | Payer: 59 | Source: Ambulatory Visit | Attending: Family Medicine | Admitting: Family Medicine

## 2020-10-14 ENCOUNTER — Ambulatory Visit: Payer: 59 | Admitting: Family Medicine

## 2020-10-14 VITALS — BP 126/71 | HR 110 | Ht 64.0 in | Wt 194.0 lb

## 2020-10-14 DIAGNOSIS — G4710 Hypersomnia, unspecified: Secondary | ICD-10-CM

## 2020-10-14 DIAGNOSIS — Z124 Encounter for screening for malignant neoplasm of cervix: Secondary | ICD-10-CM

## 2020-10-14 DIAGNOSIS — N9089 Other specified noninflammatory disorders of vulva and perineum: Secondary | ICD-10-CM | POA: Diagnosis not present

## 2020-10-14 DIAGNOSIS — L659 Nonscarring hair loss, unspecified: Secondary | ICD-10-CM

## 2020-10-14 DIAGNOSIS — B009 Herpesviral infection, unspecified: Secondary | ICD-10-CM

## 2020-10-14 DIAGNOSIS — F331 Major depressive disorder, recurrent, moderate: Secondary | ICD-10-CM

## 2020-10-14 DIAGNOSIS — F321 Major depressive disorder, single episode, moderate: Secondary | ICD-10-CM

## 2020-10-14 DIAGNOSIS — M791 Myalgia, unspecified site: Secondary | ICD-10-CM

## 2020-10-14 DIAGNOSIS — N898 Other specified noninflammatory disorders of vagina: Secondary | ICD-10-CM

## 2020-10-14 DIAGNOSIS — R300 Dysuria: Secondary | ICD-10-CM

## 2020-10-14 DIAGNOSIS — R5383 Other fatigue: Secondary | ICD-10-CM

## 2020-10-14 DIAGNOSIS — N912 Amenorrhea, unspecified: Secondary | ICD-10-CM

## 2020-10-14 LAB — POCT URINALYSIS DIP (CLINITEK)
Bilirubin, UA: NEGATIVE
Blood, UA: NEGATIVE
Glucose, UA: NEGATIVE mg/dL
Ketones, POC UA: NEGATIVE mg/dL
Nitrite, UA: NEGATIVE
POC PROTEIN,UA: NEGATIVE
Spec Grav, UA: 1.01 (ref 1.010–1.025)
Urobilinogen, UA: 0.2 E.U./dL
pH, UA: 6 (ref 5.0–8.0)

## 2020-10-14 MED ORDER — CLOTRIMAZOLE-BETAMETHASONE 1-0.05 % EX CREA: 1.0000 "application " | TOPICAL_CREAM | Freq: Two times a day (BID) | CUTANEOUS | 0 refills | Status: DC

## 2020-10-14 MED ORDER — CLONAZEPAM 0.5 MG PO TABS: 0.5000 mg | ORAL_TABLET | Freq: Every day | ORAL | 0 refills | Status: DC | PRN

## 2020-10-14 NOTE — Progress Notes (Signed)
Established Patient Office Visit  Subjective:  Patient ID: Virginia Logan, female    DOB: 1970-11-30  Age: 50 y.o. MRN: [id number]  CC:  Chief Complaint  Patient presents with  . Follow-up    HPI [name] presents for vaginal irritation.  She says that starting in about February she started having significant irritation in the vaginal area.  She says sometimes she would actually notice a lesion and sometimes it was so painful it almost felt like she was sitting on cut glass.  She had actually noticed a change in the skin color in the vaginal area back in December when we did a virtual visit.  She said that it felt puffy but felt like it was a little better but not resolving.  She does report that years ago she had tissue removed during her pregnancy.  Her son is now 10 years old.  At that time encouraged her to get back in with GYN and she wanted to see Dr. [name] in [facility]  We did place referral at that time but unfortunately she was without health insurance and ended up moving to [place] and so was lost to follow-up.  She is here today because she is started actually getting pain in that area back in February.  We actually called in a Diflucan recently for her over MyChart.  She says it did help some its not nearly as painful but it still there  She is also noticed urine has been off color and odor .  She does have pain with urination but feels like it is when the urine is hitting the skin no pelvic pain.  No back pain no fevers or chills.  Also placed order for blood work several months ago as she was very concerned about fatigue moderate myalgias and weakness.  Again she now is have his insurance and would like to get that done if at all possible.  Past Medical History:  Diagnosis Date  . Alopecia   . Anemia   . Anxiety   . Cancer (HCC)    cervical cancer, melanoma of vulva  . Cervical cancer (HCC)   . Colitis   . Depression   . Endometriosis   . GERD  (gastroesophageal reflux disease)   . Panic attack     Past Surgical History:  Procedure Laterality Date  . DILATION AND CURETTAGE OF UTERUS    . left ankle    . WISDOM TOOTH EXTRACTION      Family History  Problem Relation Age of Onset  . Diabetes Mother   . Hyperlipidemia Mother   . Heart attack Mother   . Lung cancer Mother 27  . Uterine cancer Mother   . Cancer Father        prostate  . Graves' disease Father   . Depression Father   . Anxiety disorder Father   . Crohn's disease Brother   . Dementia Maternal Aunt   . Colon cancer Maternal Aunt 62       died at 82  . Dementia Maternal Uncle   . Dementia Paternal Grandmother   . Diverticulitis Paternal Grandmother   . Scleroderma Paternal Grandmother   . Bipolar disorder Neg Hx   . Schizophrenia Neg Hx   . OCD Neg Hx   . Alcohol abuse Neg Hx   . Drug abuse Neg Hx   . Esophageal cancer Neg Hx   . Rectal cancer Neg Hx   . Stomach cancer Neg Hx  Social History   Socioeconomic History  . Marital status: Divorced    Spouse name: Not on file  . Number of children: Not on file  . Years of education: Not on file  . Highest education level: Not on file  Occupational History  . Occupation: Chartered certified accountant for [employer]  . Smoking status: Former Smoker    Packs/day: 0.30    Types: Cigarettes    Quit date: 08/20/2013    Years since quitting: 7.1  . Smokeless tobacco: Never Used  Vaping Use  . Vaping Use: Never used  Substance and Sexual Activity  . Alcohol use: No  . Drug use: No    Comment: Caffeine: Coffee 2-3 cupsper day.   Marland Kitchen Sexual activity: Yes    Partners: Male    Comment: Patient denies sexual side effects.  Other Topics Concern  . Not on file  Social History Narrative  . Not on file   Social Determinants of Health   Financial Resource Strain: Not on file  Food Insecurity: Not on file  Transportation Needs: Not on file  Physical Activity: Not on file  Stress: Not on file   Social Connections: Not on file  Intimate Partner Violence: Not on file    Outpatient Medications Prior to Visit  Medication Sig Dispense Refill  . clobetasol cream (TEMOVATE) 8.41 % Apply 1 application topically 2 (two) times daily. Apply to external vulva. 60 g 1  . estradiol (ESTRING) 2 MG vaginal ring Place 2 mg vaginally every 3 (three) months. follow package directions 1 each 1  . estradiol (VIVELLE-DOT) 0.025 MG/24HR APPLY 1 PATCH ONTO THE SKIN 2 TIMES A WEEK 16 patch 1  . levalbuterol (XOPENEX HFA) 45 MCG/ACT inhaler Inhale 2 puffs into the lungs every 4 (four) hours as needed for wheezing or shortness of breath. 1 each 1  . methocarbamol (ROBAXIN) 500 MG tablet Take 1 tablet (500 mg total) by mouth every 6 (six) hours as needed for muscle spasms. 20 tablet 0  . omeprazole (PRILOSEC) 20 MG capsule Take 1 capsule (20 mg total) by mouth 2 (two) times daily before a meal. After 6 weeks will reduce dose to the lowest effective dose. 90 capsule 3  . ondansetron (ZOFRAN ODT) 4 MG disintegrating tablet Take one tab by mouth Q6hr prn nausea 12 tablet 0  . progesterone (PROMETRIUM) 100 MG capsule TAKE 1 CAPSULE (100 MG TOTAL) BY MOUTH AT BEDTIME AS NEEDED. 90 capsule 1  . traZODone (DESYREL) 50 MG tablet Take 1-2 tablets (50-100 mg total) by mouth at bedtime as needed for sleep. 180 tablet 1  . venlafaxine XR (EFFEXOR-XR) 75 MG 24 hr capsule Take 1 capsule (75 mg total) by mouth daily with breakfast. 90 capsule 1  . clonazePAM (KLONOPIN) 0.5 MG tablet Take 1 tablet (0.5 mg total) by mouth daily as needed for anxiety. 20 tablet 0   No facility-administered medications prior to visit.    Allergies  Allergen Reactions  . Bactrim [Sulfamethoxazole-Trimethoprim] Anaphylaxis  . Ambien [Zolpidem] Other (See Comments)    Emotional Instability.  . Paxil [Paroxetine Hcl] Other (See Comments)    Excess sedation   . Moxifloxacin Rash    ROS Review of Systems    Objective:    Physical  Exam Vitals reviewed.  Constitutional:      Appearance: She is well-developed.  HENT:     Head: Normocephalic and atraumatic.  Eyes:     Conjunctiva/sclera: Conjunctivae normal.  Cardiovascular:     Rate  and Rhythm: Normal rate.  Pulmonary:     Effort: Pulmonary effort is normal.  Genitourinary:   Skin:    General: Skin is dry.     Coloration: Skin is not pale.  Neurological:     Mental Status: She is alert and oriented to person, place, and time.  Psychiatric:        Behavior: Behavior normal.     BP 126/71   Pulse (!) 110   Ht 5\' 4"  (1.626 m)   Wt 194 lb (88 kg)   SpO2 95%   BMI 33.30 kg/m  Wt Readings from Last 3 Encounters:  10/14/20 194 lb (88 kg)  05/26/20 188 lb (85.3 kg)  05/13/20 188 lb (85.3 kg)     Health Maintenance Due  Topic Date Due  . Hepatitis C Screening  Never done  . MAMMOGRAM  10/24/2017    There are no preventive care reminders to display for this patient.  Lab Results  Component Value Date   TSH 2.13 10/14/2020   Lab Results  Component Value Date   WBC 8.7 10/14/2020   HGB 17.0 (H) 10/14/2020   HCT 49.2 (H) 10/14/2020   MCV 91.1 10/14/2020   PLT 269 10/14/2020   Lab Results  Component Value Date   NA 139 10/14/2020   K 4.1 10/14/2020   CO2 24 10/14/2020   GLUCOSE 118 (H) 10/14/2020   BUN 10 10/14/2020   CREATININE 0.93 10/14/2020   BILITOT 0.5 10/14/2020   ALKPHOS 102 11/28/2015   AST 18 10/14/2020   ALT 14 10/14/2020   PROT 7.2 10/14/2020   ALBUMIN 4.3 11/28/2015   CALCIUM 9.6 10/14/2020   Lab Results  Component Value Date   CHOL 226 (H) 01/11/2020   Lab Results  Component Value Date   HDL 59 01/11/2020   Lab Results  Component Value Date   LDLCALC 136 (H) 01/11/2020   Lab Results  Component Value Date   TRIG 174 (H) 01/11/2020   Lab Results  Component Value Date   CHOLHDL 3.8 01/11/2020   Lab Results  Component Value Date   HGBA1C 5.2 10/14/2020      Assessment & Plan:   Problem List Items  Addressed This Visit      Other   Vulvar lesion    Unfortunately I am almost 100% convinced that 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  WE are going to try to get her in to a GYN hopefully close to [facility].  And then she will likely need to see a gynecologist oncologist after she gets tissue sampling.  Today we just did a swab including wet prep to look for any residual yeast or overgrowth of bacteria.  I did go ahead and do a viral culture of the area as well though it does not have a discrete herpes look.  I did prescribe Lotrisone cream today just more for comfort.  The see if we can take away some of the sharp pain and discomfort and maybe even improve some of the bleeding.      Relevant Orders   SureSwab HSV, Type 1/2 DNA, PCR (Completed)   Ambulatory referral to Gynecology   WET PREP FOR [place] (Completed)   RESOLVED: Vaginal lesion - Primary   Relevant Medications   valACYclovir (VALTREX) 1000 MG tablet   Other Relevant Orders   SureSwab HSV, Type 1/2 DNA, PCR (Completed)   WET PREP FOR [place], YEAST, CLUE (Completed)   HSV-2 infection  Relevant Medications   clotrimazole-betamethasone (LOTRISONE) cream   valACYclovir (VALTREX) 1000 MG tablet   Hair loss   Relevant Orders   Sedimentation rate (Completed)   ANA (Completed)   CBC (Completed)   COMPLETE METABOLIC PANEL WITH GFR (Completed)   TSH (Completed)   Hemoglobin A1c (Completed)   Estradiol (Completed)   Follicle stimulating hormone (Completed)   Luteinizing hormone (Completed)   Progesterone (Completed)   B12 (Completed)   Magnesium (Completed)   CK (Creatine Kinase) (Completed)   Fe+TIBC+Fer (Completed)   Anti-nuclear ab-titer (ANA titer) (Completed)   Depression, major, single episode, moderate (HCC)    Right now doing okay just dealing more with the stress of this vulvar lesion and feeling fatigued.  But she does have health insurance again and that has really taken some pressure off she  did request a refill on clonazepam which she uses sparingly.       Other Visit Diagnoses    Dysuria       Relevant Orders   POCT URINALYSIS DIP (CLINITEK)   WET PREP FOR [place], YEAST, CLUE (Completed)   Myalgia       Relevant Orders   Sedimentation rate (Completed)   ANA (Completed)   CBC (Completed)   COMPLETE METABOLIC PANEL WITH GFR (Completed)   TSH (Completed)   Hemoglobin A1c (Completed)   Estradiol (Completed)   Follicle stimulating hormone (Completed)   Luteinizing hormone (Completed)   Progesterone (Completed)   B12 (Completed)   Magnesium (Completed)   CK (Creatine Kinase) (Completed)   Fe+TIBC+Fer (Completed)   Fatigue, unspecified type       Relevant Orders   Sedimentation rate (Completed)   ANA (Completed)   CBC (Completed)   COMPLETE METABOLIC PANEL WITH GFR (Completed)   TSH (Completed)   Hemoglobin A1c (Completed)   Estradiol (Completed)   Follicle stimulating hormone (Completed)   Luteinizing hormone (Completed)   Progesterone (Completed)   B12 (Completed)   Magnesium (Completed)   CK (Creatine Kinase) (Completed)   Fe+TIBC+Fer (Completed)   Anti-nuclear ab-titer (ANA titer) (Completed)   Sleeping excessive       Relevant Orders   Sedimentation rate (Completed)   ANA (Completed)   CBC (Completed)   COMPLETE METABOLIC PANEL WITH GFR (Completed)   TSH (Completed)   Hemoglobin A1c (Completed)   Estradiol (Completed)   Follicle stimulating hormone (Completed)   Luteinizing hormone (Completed)   Progesterone (Completed)   B12 (Completed)   Magnesium (Completed)   CK (Creatine Kinase) (Completed)   Fe+TIBC+Fer (Completed)   Anti-nuclear ab-titer (ANA titer) (Completed)   Amenorrhea       Relevant Orders   Sedimentation rate (Completed)   ANA (Completed)   CBC (Completed)   COMPLETE METABOLIC PANEL WITH GFR (Completed)   TSH (Completed)   Hemoglobin A1c (Completed)   Estradiol (Completed)   Follicle stimulating hormone (Completed)    Luteinizing hormone (Completed)   Progesterone (Completed)   B12 (Completed)   Magnesium (Completed)   CK (Creatine Kinase) (Completed)   Fe+TIBC+Fer (Completed)   Anti-nuclear ab-titer (ANA titer) (Completed)   Cervical cancer screening       Relevant Orders   Cytology - PAP   Major depressive disorder, recurrent episode, moderate (HCC)   (Chronic)        Fatigue-we will evaluate for anemia, thyroid disorder, electrolyte disturbance, as well as autoimmune disease.  We did go ahead and update her Pap smear as well.  We will call with results.  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  Also given Lotrisone cream to use more for comfort.  Dysuria-urinalysis negative for any type of infection.  Meds ordered this encounter  Medications  . clotrimazole-betamethasone (LOTRISONE) cream    Sig: Apply 1 application topically 2 (two) times daily. Apply think layer. If irritating please stop.    Dispense:  30 g    Refill:  0  . clonazePAM (KLONOPIN) 0.5 MG tablet    Sig: Take 1 tablet (0.5 mg total) by mouth daily as needed for anxiety.    Dispense:  20 tablet    Refill:  0  . valACYclovir (VALTREX) 1000 MG tablet    Sig: Take 1 tablet (1,000 mg total) by mouth daily for 10 days.    Dispense:  90 tablet    Refill:  1    Follow-up: Return if symptoms worsen or fail to improve.    Beatrice Lecher, MD

## 2020-10-14 NOTE — Assessment & Plan Note (Signed)
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  WE are going to try to get her in to a GYN hopefully close to [facility].  And then she will likely need to see a gynecologist oncologist after she gets tissue sampling.  Today we just did a swab including wet prep to look for any residual yeast or overgrowth of bacteria.  I did go ahead and do a viral culture of the area as well though it does not have a discrete herpes look.  I did prescribe Lotrisone cream today just more for comfort.  The see if we can take away some of the sharp pain and discomfort and maybe even improve some of the bleeding.

## 2020-10-15 LAB — SURESWAB HSV, TYPE 1/2 DNA, PCR
HSV 1 DNA: NOT DETECTED
HSV 2 DNA: DETECTED — AB

## 2020-10-15 LAB — WET PREP FOR TRICH, YEAST, CLUE
MICRO NUMBER:: 11918602
Specimen Quality: ADEQUATE

## 2020-10-17 MED ORDER — VALACYCLOVIR HCL 1 G PO TABS: 1000.0000 mg | ORAL_TABLET | Freq: Every day | ORAL | 1 refills | Status: AC

## 2020-10-18 ENCOUNTER — Encounter: Payer: Self-pay | Admitting: Family Medicine

## 2020-10-18 DIAGNOSIS — B009 Herpesviral infection, unspecified: Secondary | ICD-10-CM | POA: Insufficient documentation

## 2020-10-18 LAB — COMPLETE METABOLIC PANEL WITH GFR
AG Ratio: 1.6 (calc) (ref 1.0–2.5)
ALT: 14 U/L (ref 6–29)
AST: 18 U/L (ref 10–35)
Albumin: 4.4 g/dL (ref 3.6–5.1)
Alkaline phosphatase (APISO): 109 U/L (ref 37–153)
BUN: 10 mg/dL (ref 7–25)
CO2: 24 mmol/L (ref 20–32)
Calcium: 9.6 mg/dL (ref 8.6–10.4)
Chloride: 104 mmol/L (ref 98–110)
Creat: 0.93 mg/dL (ref 0.50–1.05)
GFR, Est African American: 82 mL/min/{1.73_m2} (ref >=60)
GFR, Est Non African American: 71 mL/min/{1.73_m2} (ref >=60)
Globulin: 2.8 g/dL (calc) (ref 1.9–3.7)
Glucose, Bld: 118 mg/dL — ABNORMAL HIGH (ref 65–99)
Potassium: 4.1 mmol/L (ref 3.5–5.3)
Sodium: 139 mmol/L (ref 135–146)
Total Bilirubin: 0.5 mg/dL (ref 0.2–1.2)
Total Protein: 7.2 g/dL (ref 6.1–8.1)

## 2020-10-18 LAB — SEDIMENTATION RATE: Sed Rate: 6 mm/h (ref 0–30)

## 2020-10-18 LAB — IRON,TIBC AND FERRITIN PANEL
%SAT: 27 % (calc) (ref 16–45)
Ferritin: 39 ng/mL (ref 16–232)
Iron: 115 ug/dL (ref 45–160)
TIBC: 422 mcg/dL (calc) (ref 250–450)

## 2020-10-18 LAB — CBC
HCT: 49.2 % — ABNORMAL HIGH (ref 35.0–45.0)
Hemoglobin: 17 g/dL — ABNORMAL HIGH (ref 11.7–15.5)
MCH: 31.5 pg (ref 27.0–33.0)
MCHC: 34.6 g/dL (ref 32.0–36.0)
MCV: 91.1 fL (ref 80.0–100.0)
MPV: 10.5 fL (ref 7.5–12.5)
Platelets: 269 10*3/uL (ref 140–400)
RBC: 5.4 10*6/uL — ABNORMAL HIGH (ref 3.80–5.10)
RDW: 12.5 % (ref 11.0–15.0)
WBC: 8.7 10*3/uL (ref 3.8–10.8)

## 2020-10-18 LAB — VITAMIN B12: Vitamin B-12: 302 pg/mL (ref 200–1100)

## 2020-10-18 LAB — CYTOLOGY - PAP
Comment: NEGATIVE
Diagnosis: NEGATIVE
High risk HPV: NEGATIVE

## 2020-10-18 LAB — ANTI-NUCLEAR AB-TITER (ANA TITER): ANA Titer 1: 1:40 {titer} — ABNORMAL HIGH

## 2020-10-18 LAB — CK: Total CK: 63 U/L (ref 29–143)

## 2020-10-18 LAB — ANA: Anti Nuclear Antibody (ANA): POSITIVE — AB

## 2020-10-18 LAB — HEMOGLOBIN A1C
Hgb A1c MFr Bld: 5.2 % of total Hgb (ref <5.7)
Mean Plasma Glucose: 103 mg/dL
eAG (mmol/L): 5.7 mmol/L

## 2020-10-18 LAB — FOLLICLE STIMULATING HORMONE: FSH: 37.3 m[IU]/mL

## 2020-10-18 LAB — PROGESTERONE: Progesterone: 4.6 ng/mL

## 2020-10-18 LAB — TSH: TSH: 2.13 mIU/L

## 2020-10-18 LAB — LUTEINIZING HORMONE: LH: 30.3 m[IU]/mL

## 2020-10-18 LAB — MAGNESIUM: Magnesium: 2 mg/dL (ref 1.5–2.5)

## 2020-10-18 LAB — ESTRADIOL: Estradiol: 41 pg/mL

## 2020-10-18 NOTE — Assessment & Plan Note (Signed)
Right now doing okay just dealing more with the stress of this vulvar lesion and feeling fatigued.  But she does have health insurance again and that has really taken some pressure off she did request a refill on clonazepam which she uses sparingly.

## 2020-10-19 NOTE — Progress Notes (Signed)
Call patient: Your Pap smear is normal. Repeat in 5 years.

## 2020-10-20 ENCOUNTER — Encounter: Payer: Self-pay | Admitting: Family Medicine

## 2020-10-21 ENCOUNTER — Encounter: Payer: Self-pay | Admitting: Family Medicine

## 2020-10-25 ENCOUNTER — Encounter: Payer: Self-pay | Admitting: Family Medicine

## 2020-10-25 NOTE — Telephone Encounter (Signed)
Okay for work note starting today's date and ending to be determined.

## 2020-10-26 ENCOUNTER — Encounter: Payer: Self-pay | Admitting: *Deleted

## 2020-10-26 NOTE — Telephone Encounter (Signed)
Please provide work note. This is about the 4th one sent to me.  YOu may need to check the other notes.

## 2020-10-31 ENCOUNTER — Encounter: Payer: Self-pay | Admitting: Family Medicine

## 2020-11-01 ENCOUNTER — Encounter: Payer: Self-pay | Admitting: Family Medicine

## 2020-11-01 DIAGNOSIS — D582 Other hemoglobinopathies: Secondary | ICD-10-CM

## 2020-11-01 DIAGNOSIS — D751 Secondary polycythemia: Secondary | ICD-10-CM

## 2020-11-08 NOTE — Telephone Encounter (Signed)
Orders Placed This Encounter  Procedures   DG Chest 2 View    Standing Status:   Future    Standing Expiration Date:   11/08/2021    Order Specific Question:   Reason for Exam (SYMPTOM  OR DIAGNOSIS REQUIRED)    Answer:   polycythemia    Order Specific Question:   Is patient pregnant?    Answer:   No    Order Specific Question:   Preferred imaging location?    Answer:   [facility]   Erythropoietin   JAK2 V617F, w Reflex to CALR/E12/MPL

## 2020-11-16 ENCOUNTER — Other Ambulatory Visit: Payer: Self-pay | Admitting: Medical-Surgical

## 2020-11-16 MED ORDER — TRAZODONE HCL 50 MG PO TABS: 50.0000 mg | ORAL_TABLET | Freq: Every evening | ORAL | 0 refills | Status: DC | PRN

## 2020-11-23 ENCOUNTER — Encounter: Payer: Self-pay | Admitting: Family Medicine

## 2020-11-25 ENCOUNTER — Encounter: Payer: Self-pay | Admitting: Family Medicine

## 2020-11-29 ENCOUNTER — Encounter: Payer: Self-pay | Admitting: Family Medicine

## 2020-11-29 NOTE — Telephone Encounter (Signed)
OK for work note

## 2020-12-24 ENCOUNTER — Other Ambulatory Visit: Payer: Self-pay | Admitting: Family Medicine

## 2021-02-18 ENCOUNTER — Other Ambulatory Visit: Payer: Self-pay | Admitting: Medical-Surgical

## 2021-02-20 NOTE — Telephone Encounter (Signed)
[place] pt

## 2021-03-22 ENCOUNTER — Ambulatory Visit (INDEPENDENT_AMBULATORY_CARE_PROVIDER_SITE_OTHER): Payer: 59

## 2021-03-22 ENCOUNTER — Encounter: Payer: Self-pay | Admitting: Family Medicine

## 2021-03-22 ENCOUNTER — Other Ambulatory Visit: Payer: Self-pay

## 2021-03-22 ENCOUNTER — Ambulatory Visit (INDEPENDENT_AMBULATORY_CARE_PROVIDER_SITE_OTHER): Payer: 59 | Admitting: Family Medicine

## 2021-03-22 VITALS — BP 126/71 | HR 90 | Ht 64.0 in | Wt 249.0 lb

## 2021-03-22 DIAGNOSIS — N950 Postmenopausal bleeding: Secondary | ICD-10-CM | POA: Diagnosis not present

## 2021-03-22 DIAGNOSIS — D751 Secondary polycythemia: Secondary | ICD-10-CM | POA: Diagnosis not present

## 2021-03-22 DIAGNOSIS — Z13220 Encounter for screening for lipoid disorders: Secondary | ICD-10-CM

## 2021-03-22 DIAGNOSIS — R768 Other specified abnormal immunological findings in serum: Secondary | ICD-10-CM | POA: Diagnosis not present

## 2021-03-22 DIAGNOSIS — Z79890 Hormone replacement therapy (postmenopausal): Secondary | ICD-10-CM

## 2021-03-22 NOTE — Progress Notes (Signed)
Established Patient Office Visit  Subjective:  Patient ID: Virginia Logan, female    DOB: 04-Oct-1970  Age: 50 y.o. MRN: [id number]  CC: No chief complaint on file.   HPI Virginia Logan presents for restab care and here for post meno bleeding.  She says recently about a month and a half ago her OB/GYN increased her estradiol patch to she thinks 0.75 mg.  [place] area.  He says when she first went up on the estrogen she had a couple days of spotting but then it went away.  Her last full menstrual period was the most 2 years ago.  But the bleeding has started back and has been getting heavier over the last several days.  She is not currently sexually active and has not been recently.  She has had some low back discomfort with this as well and does have a prior history of endometriosis.  Past Medical History:  Diagnosis Date   Alopecia    Anemia    Anxiety    Cancer (HCC)    cervical cancer, melanoma of vulva   Cervical cancer (HCC)    Colitis    Depression    Endometriosis    GERD (gastroesophageal reflux disease)    Panic attack     Past Surgical History:  Procedure Laterality Date   DILATION AND CURETTAGE OF UTERUS     left ankle     WISDOM TOOTH EXTRACTION      Family History  Problem Relation Age of Onset   Diabetes Mother    Hyperlipidemia Mother    Heart attack Mother    Lung cancer Mother 85   Uterine cancer Mother    Cancer Father        prostate   Graves' disease Father    Depression Father    Anxiety disorder Father    Crohn's disease Brother    Dementia Maternal Aunt    Colon cancer Maternal Aunt 71       died at 69   Dementia Maternal Uncle    Dementia Paternal Grandmother    Diverticulitis Paternal Grandmother    Scleroderma Paternal Grandmother    Bipolar disorder Neg Hx    Schizophrenia Neg Hx    OCD Neg Hx    Alcohol abuse Neg Hx    Drug abuse Neg Hx     Esophageal cancer Neg Hx    Rectal cancer Neg Hx    Stomach cancer Neg Hx     Social History   Socioeconomic History   Marital status: Divorced    Spouse name: Not on file   Number of children: Not on file   Years of education: Not on file   Highest education level: Not on file  Occupational History   Occupation: Chartered certified accountant for [employer]  Tobacco Use   Smoking status: Former    Packs/day: 0.30    Types: Cigarettes    Quit date: 08/20/2013    Years since quitting: 7.5   Smokeless tobacco: Never  Vaping Use   Vaping Use: Never used  Substance and Sexual Activity   Alcohol use: No   Drug use: No    Comment: Caffeine: Coffee 2-3 cupsper day.    Sexual activity: Yes    Partners: Male    Comment: Patient denies sexual side effects.  Other Topics Concern   Not on file  Social History  Narrative   Not on file   Social Determinants of Health   Financial Resource Strain: Not on file  Food Insecurity: Not on file  Transportation Needs: Not on file  Physical Activity: Not on file  Stress: Not on file  Social Connections: Not on file  Intimate Partner Violence: Not on file    Outpatient Medications Prior to Visit  Medication Sig Dispense Refill   estradiol (VIVELLE-DOT) 0.075 MG/24HR 1 patch 2 (two) times a week.  3   levalbuterol (XOPENEX HFA) 45 MCG/ACT inhaler Inhale 2 puffs into the lungs every 4 (four) hours as needed for wheezing or shortness of breath. 1 each 1   progesterone (PROMETRIUM) 100 MG capsule TAKE 1 CAPSULE (100 MG TOTAL) BY MOUTH AT BEDTIME AS NEEDED. 90 capsule 1   traZODone (DESYREL) 50 MG tablet TAKE 1-2 TABLETS BY MOUTH AT BEDTIME AS NEEDED FOR SLEEP. 180 tablet 0   venlafaxine XR (EFFEXOR-XR) 75 MG 24 hr capsule TAKE 1 CAPSULE BY MOUTH DAILY WITH BREAKFAST. 90 capsule 1   clobetasol cream (TEMOVATE) 1.76 % Apply 1 application topically 2 (two) times daily. Apply to external vulva. 60 g 1   clonazePAM (KLONOPIN) 0.5 MG tablet Take 1 tablet (0.5 mg  total) by mouth daily as needed for anxiety. 20 tablet 0   clotrimazole-betamethasone (LOTRISONE) cream Apply 1 application topically 2 (two) times daily. Apply think layer. If irritating please stop. 30 g 0   estradiol (ESTRING) 2 MG vaginal ring Place 2 mg vaginally every 3 (three) months. follow package directions 1 each 1   estradiol (VIVELLE-DOT) 0.025 MG/24HR APPLY 1 PATCH ONTO THE SKIN 2 TIMES A WEEK 16 patch 1   methocarbamol (ROBAXIN) 500 MG tablet Take 1 tablet (500 mg total) by mouth every 6 (six) hours as needed for muscle spasms. 20 tablet 0   omeprazole (PRILOSEC) 20 MG capsule Take 1 capsule (20 mg total) by mouth 2 (two) times daily before a meal. After 6 weeks will reduce dose to the lowest effective dose. 90 capsule 3   ondansetron (ZOFRAN ODT) 4 MG disintegrating tablet Take one tab by mouth Q6hr prn nausea 12 tablet 0   No facility-administered medications prior to visit.    Allergies  Allergen Reactions   Bactrim [Sulfamethoxazole-Trimethoprim] Anaphylaxis   Ambien [Zolpidem] Other (See Comments)    Emotional Instability.   Paxil [Paroxetine Hcl] Other (See Comments)    Excess sedation    Moxifloxacin Rash    ROS Review of Systems    Objective:    Physical Exam Vitals reviewed.  Constitutional:      Appearance: She is well-developed.  HENT:     Head: Normocephalic and atraumatic.  Eyes:     Conjunctiva/sclera: Conjunctivae normal.  Cardiovascular:     Rate and Rhythm: Normal rate.  Pulmonary:     Effort: Pulmonary effort is normal.  Skin:    General: Skin is dry.  Neurological:     Mental Status: She is alert and oriented to person, place, and time.  Psychiatric:        Behavior: Behavior normal.    BP 126/71   Pulse 90   Ht 5\' 4"  (1.626 m)   Wt 249 lb (112.9 kg)   SpO2 97%   BMI 42.74 kg/m  Wt Readings from Last 3 Encounters:  03/22/21 249 lb (112.9 kg)  10/14/20 194 lb (88 kg)  05/26/20 188 lb (85.3 kg)     Health Maintenance Due   Topic Date Due  COVID-19 Vaccine (1) Never done   Pneumococcal Vaccine 17-96 Years old (1 - PCV) Never done   Hepatitis C Screening  Never done   MAMMOGRAM  10/24/2017   Zoster Vaccines- Shingrix (1 of 2) Never done   INFLUENZA VACCINE  12/26/2020    There are no preventive care reminders to display for this patient.  Lab Results  Component Value Date   TSH 2.13 10/14/2020   Lab Results  Component Value Date   WBC 6.6 03/22/2021   HGB 16.3 (H) 03/22/2021   HCT 48.1 (H) 03/22/2021   MCV 89.6 03/22/2021   PLT 244 03/22/2021   Lab Results  Component Value Date   NA 139 10/14/2020   K 4.1 10/14/2020   CO2 24 10/14/2020   GLUCOSE 118 (H) 10/14/2020   BUN 10 10/14/2020   CREATININE 0.93 10/14/2020   BILITOT 0.5 10/14/2020   ALKPHOS 102 11/28/2015   AST 18 10/14/2020   ALT 14 10/14/2020   PROT 7.2 10/14/2020   ALBUMIN 4.3 11/28/2015   CALCIUM 9.6 10/14/2020   Lab Results  Component Value Date   CHOL 226 (H) 01/11/2020   Lab Results  Component Value Date   HDL 59 01/11/2020   Lab Results  Component Value Date   LDLCALC 136 (H) 01/11/2020   Lab Results  Component Value Date   TRIG 174 (H) 01/11/2020   Lab Results  Component Value Date   CHOLHDL 3.8 01/11/2020   Lab Results  Component Value Date   HGBA1C 5.2 10/14/2020      Assessment & Plan:   Problem List Items Addressed This Visit       Other   Post-menopausal bleeding - Primary   Relevant Orders   US Pelvic Complete With Transvaginal   Lipid Panel w/reflex Direct LDL   COMPLETE METABOLIC PANEL WITH GFR   CBC (Completed)   Erythropoietin   JAK2 V617F Mutation Analysis   ANA   Ambulatory referral to Obstetrics / Gynecology   Other Visit Diagnoses     Polycythemia       Relevant Orders   Lipid Panel w/reflex Direct LDL   COMPLETE METABOLIC PANEL WITH GFR   CBC (Completed)   Erythropoietin   JAK2 V617F Mutation Analysis   Elevated antinuclear antibody (ANA) level       Relevant  Orders   ANA   Screening, lipid       Relevant Orders   Lipid Panel w/reflex Direct LDL   COMPLETE METABOLIC PANEL WITH GFR      Postmenopausal bleeding-I suspect is probably related to the bump up in her estrogen patch.  She will call me with the dose and we can maybe try to step it back down.  In the interim we will go ahead and order a pelvic ultrasound for further work-up as well as get her in again with a local GYN here she may need an endometrial biopsy for further work-up as well.  Again I suspect it is probably from the estrogen component.  She also wants to consider tapering off of her  She also wanted to go ahead and get up-to-date blood work today since its been a while since we have done that.  No orders of the defined types were placed in this encounter.   Follow-up: No follow-ups on file.    Beatrice Lecher, MD

## 2021-03-23 ENCOUNTER — Other Ambulatory Visit: Payer: Self-pay

## 2021-03-23 MED ORDER — VENLAFAXINE HCL ER 75 MG PO CP24: 75.0000 mg | ORAL_CAPSULE | Freq: Every day | ORAL | 1 refills | Status: DC

## 2021-03-23 NOTE — Telephone Encounter (Signed)
Please let her know that the progesterone capsule, but the lowest it comes and is the 100 that will make it any smaller.  So what she might want to do is just try stopping it to see how she does without it.  We can definitely decrease the estradiol patch to the 0.05 mg.  Please see what pharmacy she would like that sent to it is pended so please send.

## 2021-03-23 NOTE — Progress Notes (Signed)
Hi Virginia Logan,  The ultrasound does show a little thickening of the endometrial lining which is what is shed every time you have a period.  Again I still think this could be somewhat related to the estrogen so we can still work on decreasing her dose from get a go ahead and move forward with getting you in with GYN so that they can do a in office procedure to take a sampling of that lining with a little catheter and send it to the lab to have them look at the cells to make sure that they appear to be normal.  You do have a few cysts right there at the cervix as well.  These are most likely benign.  But will have the GYN take a look at that as well.

## 2021-03-24 MED ORDER — ESTRADIOL 0.05 MG/24HR TD PTTW: 1.0000 | MEDICATED_PATCH | TRANSDERMAL | 6 refills | Status: DC

## 2021-03-24 NOTE — Progress Notes (Signed)
Overall labs look OK, cholesterol is elevated, but it is better than last time!! [place] work!!

## 2021-03-28 LAB — COMPLETE METABOLIC PANEL WITH GFR
AG Ratio: 1.6 (calc) (ref 1.0–2.5)
ALT: 19 U/L (ref 6–29)
AST: 21 U/L (ref 10–35)
Albumin: 4.5 g/dL (ref 3.6–5.1)
Alkaline phosphatase (APISO): 113 U/L (ref 37–153)
BUN: 9 mg/dL (ref 7–25)
CO2: 24 mmol/L (ref 20–32)
Calcium: 9.9 mg/dL (ref 8.6–10.4)
Chloride: 102 mmol/L (ref 98–110)
Creat: 0.89 mg/dL (ref 0.50–1.03)
Globulin: 2.8 g/dL (calc) (ref 1.9–3.7)
Glucose, Bld: 100 mg/dL — ABNORMAL HIGH (ref 65–99)
Potassium: 4.9 mmol/L (ref 3.5–5.3)
Sodium: 139 mmol/L (ref 135–146)
Total Bilirubin: 0.5 mg/dL (ref 0.2–1.2)
Total Protein: 7.3 g/dL (ref 6.1–8.1)
eGFR: 78 mL/min/{1.73_m2} (ref >=60)

## 2021-03-28 LAB — LIPID PANEL W/REFLEX DIRECT LDL
Cholesterol: 212 mg/dL — ABNORMAL HIGH (ref <200)
HDL: 56 mg/dL (ref >=50)
LDL Cholesterol (Calc): 122 mg/dL (calc) — ABNORMAL HIGH
Non-HDL Cholesterol (Calc): 156 mg/dL (calc) — ABNORMAL HIGH (ref <130)
Total CHOL/HDL Ratio: 3.8 (calc) (ref <5.0)
Triglycerides: 223 mg/dL — ABNORMAL HIGH (ref <150)

## 2021-03-28 LAB — ANA: Anti Nuclear Antibody (ANA): POSITIVE — AB

## 2021-03-28 LAB — JAK2 V617F MUTATION ANALYSIS: JAK2 V617F Mutation: NOT DETECTED

## 2021-03-28 LAB — CBC
HCT: 48.1 % — ABNORMAL HIGH (ref 35.0–45.0)
Hemoglobin: 16.3 g/dL — ABNORMAL HIGH (ref 11.7–15.5)
MCH: 30.4 pg (ref 27.0–33.0)
MCHC: 33.9 g/dL (ref 32.0–36.0)
MCV: 89.6 fL (ref 80.0–100.0)
MPV: 10.1 fL (ref 7.5–12.5)
Platelets: 244 10*3/uL (ref 140–400)
RBC: 5.37 10*6/uL — ABNORMAL HIGH (ref 3.80–5.10)
RDW: 12.8 % (ref 11.0–15.0)
WBC: 6.6 10*3/uL (ref 3.8–10.8)

## 2021-03-28 LAB — ERYTHROPOIETIN: Erythropoietin: 11.4 m[IU]/mL (ref 2.6–18.5)

## 2021-03-28 LAB — ANTI-NUCLEAR AB-TITER (ANA TITER)
ANA TITER: 1:80 {titer} — ABNORMAL HIGH
ANA Titer 1: 1:40 {titer} — ABNORMAL HIGH

## 2021-03-29 ENCOUNTER — Encounter: Payer: Self-pay | Admitting: Family Medicine

## 2021-03-29 DIAGNOSIS — R768 Other specified abnormal immunological findings in serum: Secondary | ICD-10-CM

## 2021-03-29 DIAGNOSIS — N950 Postmenopausal bleeding: Secondary | ICD-10-CM

## 2021-03-29 DIAGNOSIS — Z79890 Hormone replacement therapy (postmenopausal): Secondary | ICD-10-CM

## 2021-03-29 NOTE — Progress Notes (Signed)
Hi Virginia Logan,  sorry for the delay in getting back with you on some of the additional tests.  Your erythropoietin levels look good.  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  Your ANA is mildly elevated for 1 pattern and just a little bit higher for a second pattern.  I think it is worth a rheumatology referral.  Do you have a preference for which rheumatologist you might want to see?

## 2021-03-30 NOTE — Addendum Note (Signed)
Addended by: Beatrice Lecher D on: 03/30/2021 08:00 AM   Modules accepted: Orders

## 2021-03-30 NOTE — Telephone Encounter (Signed)
Orders Placed This Encounter  Procedures   Ambulatory referral to Obstetrics / Gynecology    Referral Priority:   Routine    Referral Type:   Consultation    Referral Reason:   Specialty Services Required    Requested Specialty:   Obstetrics and Gynecology    Number of Visits Requested:   1   Ambulatory referral to Rheumatology    Referral Priority:   Routine    Referral Type:   Consultation    Referral Reason:   Specialty Services Required    Requested Specialty:   Rheumatology    Number of Visits Requested:   1

## 2021-03-31 ENCOUNTER — Encounter: Payer: Self-pay | Admitting: Family Medicine

## 2021-05-11 ENCOUNTER — Encounter: Payer: Self-pay | Admitting: Family Medicine

## 2021-05-12 ENCOUNTER — Telehealth: Payer: 59 | Admitting: Physician Assistant

## 2021-05-12 DIAGNOSIS — B9689 Other specified bacterial agents as the cause of diseases classified elsewhere: Secondary | ICD-10-CM | POA: Diagnosis not present

## 2021-05-12 DIAGNOSIS — J069 Acute upper respiratory infection, unspecified: Secondary | ICD-10-CM

## 2021-05-12 MED ORDER — PREDNISONE 10 MG (21) PO TBPK: ORAL_TABLET | ORAL | 0 refills | Status: DC

## 2021-05-12 MED ORDER — AMOXICILLIN-POT CLAVULANATE 875-125 MG PO TABS: 1.0000 | ORAL_TABLET | Freq: Two times a day (BID) | ORAL | 0 refills | Status: DC

## 2021-05-12 NOTE — Telephone Encounter (Signed)
Sent referral to [facility] Rheumatology as patient requested. - CF

## 2021-05-12 NOTE — Progress Notes (Signed)
Virtual Visit Consent   Virginia Logan, you are scheduled for a virtual visit with a [place] provider today.     Just as with appointments in the office, your consent must be obtained to participate.  Your consent will be active for this visit and any virtual visit you may have with one of our providers in the next 365 days.     If you have a MyChart account, a copy of this consent can be sent to you electronically.  All virtual visits are billed to your insurance company just like a traditional visit in the office.    As this is a virtual visit, video technology does not allow for your provider to perform a traditional examination.  This may limit your provider's ability to fully assess your condition.  If your provider identifies any concerns that need to be evaluated in person or the need to arrange testing (such as labs, EKG, etc.), we will make arrangements to do so.     Although advances in technology are sophisticated, we cannot ensure that it will always work on either your end or our end.  If the connection with a video visit is poor, the visit may have to be switched to a telephone visit.  With either a video or telephone visit, we are not always able to ensure that we have a secure connection.     I need to obtain your verbal consent now.   Are you willing to proceed with your visit today?    Virginia Logan has provided verbal consent on 05/12/2021 for a virtual visit (video or telephone).   Mar Daring, PA-C   Date: 05/12/2021 9:38 AM   Virtual Visit via Video Note   I, Mar Daring, connected with  Virginia Logan  ([id number], [birth year]) on 05/12/21 at  9:30 AM EST by a video-enabled telemedicine application and verified that I am speaking with the correct person using two identifiers.  Location: Patient: Virtual Visit Location Patient: Home Provider: Virtual Visit Location Provider: Home Office   I discussed the limitations of evaluation and management  by telemedicine and the availability of in person appointments. The patient expressed understanding and agreed to proceed.    History of Present Illness: Virginia Logan is a 50 y.o. who identifies as a female who was assigned female at birth, and is being seen today for .  HPI: URI  This is a new problem. The current episode started 1 to 4 weeks ago. The problem has been gradually worsening. Maximum temperature: subjective fever. Associated symptoms include congestion, coughing, headaches, joint pain (improved), rhinorrhea, sinus pain and a sore throat. Pertinent negatives include no diarrhea, ear pain, nausea, plugged ear sensation or vomiting. She has tried acetaminophen, decongestant, increased fluids and sleep for the symptoms. The treatment provided no relief.  Reports symptoms started over a week ago with sinus symptoms. Then she developed fever, chills, body aches. Those symptoms then improved after a couple of days. Then yesterday she noticed the symptoms returning. Joint pains not as bad as initially.    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   Paxil [Paroxetine Hcl] Other (See Comments)    Excess sedation    Moxifloxacin Rash   Medications:  Current Outpatient Medications:    amoxicillin-clavulanate (AUGMENTIN) 875-125 MG tablet, Take 1 tablet  by mouth 2 (two) times daily., Disp: 14 tablet, Rfl: 0   predniSONE (STERAPRED UNI-PAK 21 TAB) 10 MG (21) TBPK tablet, 6 day taper; take as directed on package instructions, Disp: 21 tablet, Rfl: 0   estradiol (VIVELLE-DOT) 0.05 MG/24HR patch, Place 1 patch (0.05 mg total) onto the skin 2 (two) times a week., Disp: 8 patch, Rfl: 6   levalbuterol (XOPENEX HFA) 45 MCG/ACT inhaler, Inhale 2 puffs into the lungs every 4 (four) hours as needed for wheezing or shortness of breath., Disp: 1 each, Rfl: 1   progesterone (PROMETRIUM) 100 MG capsule, TAKE 1 CAPSULE (100 MG TOTAL) BY MOUTH AT BEDTIME AS NEEDED., Disp: 90 capsule, Rfl: 1   traZODone (DESYREL) 50 MG tablet, TAKE 1-2 TABLETS BY MOUTH AT BEDTIME AS NEEDED FOR SLEEP., Disp: 180 tablet, Rfl: 0   venlafaxine XR (EFFEXOR-XR) 75 MG 24 hr capsule, Take 1 capsule (75 mg total) by mouth daily with breakfast., Disp: 90 capsule, Rfl: 1  Observations/Objective: Patient is well-developed, well-nourished in no acute distress.  Resting comfortably at home.  Head is normocephalic, atraumatic.  No labored breathing.  Speech is clear and coherent with logical content.  Patient is alert and oriented at baseline.    Assessment and Plan: 1. Bacterial upper respiratory infection - predniSONE (STERAPRED UNI-PAK 21 TAB) 10 MG (21) TBPK tablet; 6 day taper; take as directed on package instructions  Dispense: 21 tablet; Refill: 0 - amoxicillin-clavulanate (AUGMENTIN) 875-125 MG tablet; Take 1 tablet by mouth 2 (two) times daily.  Dispense: 14 tablet; Refill: 0  - Suspect second sickening, bacterial - Start augmentin and prednisone taper - Continue OTC symptomatic management medication of choice - Push fluids - Rest - Seek in person evaluation if symptoms worsen or fail to improve  Follow Up Instructions: I discussed the assessment and treatment plan with the patient. The patient was provided an opportunity to ask questions and all were answered. The patient  agreed with the plan and demonstrated an understanding of the instructions.  A copy of instructions were sent to the patient via MyChart unless otherwise noted below.    The patient was advised to call back or seek an in-person evaluation if the symptoms worsen or if the condition fails to improve as anticipated.  Time:  I spent 10 minutes with the patient via telehealth technology discussing the above problems/concerns.    Mar Daring, PA-C

## 2021-05-12 NOTE — Patient Instructions (Signed)
[name], thank you for joining Mar Daring, PA-C for today's virtual visit.  While this provider is not your primary care provider (PCP), if your PCP is located in our provider database this encounter information will be shared with them immediately following your visit.  Consent: (Patient) [name] provided verbal consent for this virtual visit at the beginning of the encounter.  Current Medications:  Current Outpatient Medications:    amoxicillin-clavulanate (AUGMENTIN) 875-125 MG tablet, Take 1 tablet by mouth 2 (two) times daily., Disp: 14 tablet, Rfl: 0   predniSONE (STERAPRED UNI-PAK 21 TAB) 10 MG (21) TBPK tablet, 6 day taper; take as directed on package instructions, Disp: 21 tablet, Rfl: 0   estradiol (VIVELLE-DOT) 0.05 MG/24HR patch, Place 1 patch (0.05 mg total) onto the skin 2 (two) times a week., Disp: 8 patch, Rfl: 6   levalbuterol (XOPENEX HFA) 45 MCG/ACT inhaler, Inhale 2 puffs into the lungs every 4 (four) hours as needed for wheezing or shortness of breath., Disp: 1 each, Rfl: 1   progesterone (PROMETRIUM) 100 MG capsule, TAKE 1 CAPSULE (100 MG TOTAL) BY MOUTH AT BEDTIME AS NEEDED., Disp: 90 capsule, Rfl: 1   traZODone (DESYREL) 50 MG tablet, TAKE 1-2 TABLETS BY MOUTH AT BEDTIME AS NEEDED FOR SLEEP., Disp: 180 tablet, Rfl: 0   venlafaxine XR (EFFEXOR-XR) 75 MG 24 hr capsule, Take 1 capsule (75 mg total) by mouth daily with breakfast., Disp: 90 capsule, Rfl: 1   Medications ordered in this encounter:  Meds ordered this encounter  Medications   predniSONE (STERAPRED UNI-PAK 21 TAB) 10 MG (21) TBPK tablet    Sig: 6 day taper; take as directed on package instructions    Dispense:  21 tablet    Refill:  0    Order Specific Question:   Supervising Provider    Answer:   Sabra Heck, BRIAN [[id number]]   amoxicillin-clavulanate (AUGMENTIN) 875-125 MG tablet    Sig: Take 1 tablet by mouth 2 (two) times daily.    Dispense:  14 tablet    Refill:  0    Order Specific  Question:   Supervising Provider    Answer:   Sabra Heck, BRIAN [[id number]]     *If you need refills on other medications prior to your next appointment, please contact your pharmacy*  Follow-Up: Call back or seek an in-person evaluation if the symptoms worsen or if the condition fails to improve as anticipated.  Other Instructions Upper Respiratory Infection, Adult An upper respiratory infection (URI) affects the nose, throat, and upper airways that lead to the lungs. The most common type of URI is often called the common cold. URIs usually get better on their own, without medical treatment. What are the causes? A URI is caused by a germ (virus). You may catch these germs by: Breathing in droplets from an infected person's cough or sneeze. Touching something that has the germ on it (is contaminated) and then touching your mouth, nose, or eyes. What increases the risk? You are more likely to get a URI if: You are very young or very old. You have close contact with others, such as at work, school, or a health care facility. You smoke. You have long-term (chronic) heart or lung disease. You have a weakened disease-fighting system (immune system). You have nasal allergies or asthma. You have a lot of stress. You have poor nutrition. What are the signs or symptoms? Runny or stuffy (congested) nose. Cough. Sneezing. Sore throat. Headache. Feeling tired (fatigue). Fever. Not wanting to  eat as much as usual. Pain in your forehead, behind your eyes, and over your cheekbones (sinus pain). Muscle aches. Redness or irritation of the eyes. Pressure in the ears or face. How is this treated? URIs usually get better on their own within 7-10 days. Medicines cannot cure URIs, but your doctor may recommend certain medicines to help relieve symptoms, such as: Over-the-counter cold medicines. Medicines to reduce coughing (cough suppressants). Coughing is a type of defense against infection that helps to  clear the nose, throat, windpipe, and lungs (respiratory system). Take these medicines only as told by your doctor. Medicines to lower your fever. Follow these instructions at home: Activity Rest as needed. If you have a fever, stay home from work or school until your fever is gone, or until your doctor says you may return to work or school. You should stay home until you cannot spread the infection anymore (you are not contagious). Your doctor may have you wear a face mask so you have less risk of spreading the infection. Relieving symptoms Rinse your mouth often with salt water. To make salt water, dissolve -1 tsp (3-6 g) of salt in 1 cup (237 mL) of warm water. Use a cool-mist humidifier to add moisture to the air. This can help you breathe more easily. Eating and drinking  Drink enough fluid to keep your pee (urine) pale yellow. Eat soups and other clear broths. General instructions  Take over-the-counter and prescription medicines only as told by your doctor. Do not smoke or use any products that contain nicotine or tobacco. If you need help quitting, ask your doctor. Avoid being where people are smoking (avoid secondhand smoke). Stay up to date on all your shots (immunizations), and get the flu shot every year. Keep all follow-up visits. How to prevent the spread of infection to others  Wash your hands with soap and water for at least 20 seconds. If you cannot use soap and water, use hand sanitizer. Avoid touching your mouth, face, eyes, or nose. Cough or sneeze into a tissue or your sleeve or elbow. Do not cough or sneeze into your hand or into the air. Contact a doctor if: You are getting worse, not better. You have any of these: A fever or chills. Brown or red mucus in your nose. Yellow or brown fluid (discharge)coming from your nose. Pain in your face, especially when you bend forward. Swollen neck glands. Pain when you swallow. White areas in the back of your  throat. Get help right away if: You have shortness of breath that gets worse. You have very bad or constant: Headache. Ear pain. Pain in your forehead, behind your eyes, and over your cheekbones (sinus pain). Chest pain. You have long-lasting (chronic) lung disease along with any of these: Making high-pitched whistling sounds when you breathe, most often when you breathe out (wheezing). Long-lasting cough (more than 14 days). Coughing up blood. A change in your usual mucus. You have a stiff neck. You have changes in your: Vision. Hearing. Thinking. Mood. These symptoms may be an emergency. Get help right away. Call 911. Do not wait to see if the symptoms will go away. Do not drive yourself to the hospital. Summary An upper respiratory infection (URI) is caused by a germ (virus). The most common type of URI is often called the common cold. URIs usually get better within 7-10 days. Take over-the-counter and prescription medicines only as told by your doctor. This information is not intended to replace advice given to  you by your health care provider. Make sure you discuss any questions you have with your health care provider. Document Revised: 12/14/2020 Document Reviewed: 12/14/2020 Elsevier Patient Education  2022 [employer].    If you have been instructed to have an in-person evaluation today at a local Urgent Care facility, please use the link below. It will take you to a list of all of our available [facility], including address, phone number and hours of operation. Please do not delay care.  [facility]  If you or a family member do not have a primary care provider, use the link below to schedule a visit and establish care. When you choose a [facility] primary care physician or advanced practice provider, you gain a long-term partner in health. Find a Primary Care Provider  Learn more about 's in-office and virtual care  options: [facility]

## 2021-05-22 ENCOUNTER — Encounter: Payer: Self-pay | Admitting: Family Medicine

## 2021-05-22 ENCOUNTER — Other Ambulatory Visit: Payer: Self-pay | Admitting: Medical-Surgical

## 2021-05-23 MED ORDER — TRAZODONE HCL 100 MG PO TABS
100.0000 mg | ORAL_TABLET | Freq: Every evening | ORAL | 1 refills | Status: DC | PRN
Start: 2021-05-23 — End: 2021-09-19

## 2021-05-23 NOTE — Telephone Encounter (Signed)
Opened in error. T. Harolyn Cocker, CMA 

## 2021-05-28 HISTORY — PX: TOTAL LAPAROSCOPIC HYSTERECTOMY WITH BILATERAL SALPINGO OOPHORECTOMY: SHX6845

## 2021-06-25 NOTE — Progress Notes (Signed)
Office Visit Note  Patient: Virginia Logan             Date of Birth: 1971/05/25           MRN: [id number]             PCP: Hali Marry, MD Referring: Hali Marry, * Visit Date: 06/26/2021 Occupation: [facility] healthcare data access  Subjective:  positive labs (Patient states positive ANA with speckled patterns. Problems started about 9 years ago after allergic reaction to Bactrim. Extreme fatigue, joint pains, brain fogginess.  )   History of Present Illness: Virginia Logan is a 51 y.o. female here for evaluation of positive ANA checked in association with multiple symptoms including extreme fatigue, joint pain in many areas, brain fogginess with impaired concentration and short-term recall, episodic skin rashes, and more recently abnormal uterine bleeding, erythrocytosis, and alopecia.  She states symptoms started about 9 years ago after a severe allergic reaction to Bactrim requiring hospitalization for anaphylaxis.  After she was treated for this she felt symptoms started with episodes of generalized fatigue body aches sometimes rashes these would affect her for about 1 day out of a month.  Over time the duration frequency and severity of symptoms has worsened and now she feels there are more bad days than good.  She does not typically know of any provoking exposure or incidents. Fatigue is very bad regardless of physical activity. She takes trazodone for sleep and reports needing to nap for lunch break about 80% of work days. She doesn't notice any particular night time awakenings or disruptions. She has never had a sleep study. Body pains do not localize to specific joints typically more generalized symptoms or in her muscles. Most often affected in her back the mid to upper back is the worst. She notices swelling around her ankles that comes and goes. She broke her left ankle many years ago with surgical internal fixation and has sometimes problems with this including plantar  fasciitis in the past. She feels great when taking prednisone.  She has not had much improvement with oral NSAIDs.  Back spasm pain has improved with muscle relaxants in the past. She has some chronic erythema and flushing with rosacea type skin changes on the face.  She has had a couple episodes of skin rash breaking out on her torso under her arms and around breasts.  Last year was having some rash breaking out around her vulvar area this was biopsied with unclear cause and resolved with topical steroid treatment.  She notices pale or bluish discoloration of fingertips with cold exposure no pitting, ulcers or peeling. She had some episodes of very painful burning or stinging eye irritation but does not have any chronic dryness or redness.  She is nearsighted but does not follow-up frequently with eye exams. 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 She was also treated for H. Pylori after that time. She is not on any long-term maintenance or routine GI follow-up.  She has some positive family history grandmother with scleroderma, father with graves disease, brother with crohns disease.  She previously saw rheumatology about 5-6 years ago for ongoing symptoms no specific diagnosis made at that time and did not follow up long term.  Labs reviewed ANA 1:80 speckled 1:40 homogenous CBC Hgb 16.3 CMP wnl JAK2 mutation neg Epo wnl Lipids all elevation   Review of Systems  Constitutional:  Positive for weight gain. Negative  for fever.  Eyes:  Positive for pain. Negative for redness and itching.  Musculoskeletal:  Positive for joint pain, joint pain and morning stiffness. Negative for joint swelling.  Skin:  Positive for color change and rash. Negative for nodules/bumps.  Neurological:  Positive for memory loss. Negative for numbness, paresthesias and weakness.  Hematological:  Negative for bruising/bleeding tendency and  swollen glands.  Psychiatric/Behavioral:  The patient is nervous/anxious.    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   Generalized anxiety disorder 07/08/2013   Depression with anxiety 07/24/2012   Endometriosis 08/31/2010   ASTHMA UNSPECIFIED WITH EXACERBATION 11/10/2009    Past Medical History:  Diagnosis Date   Alopecia    Anemia    Anxiety    Cancer ([place])    cervical cancer, melanoma of vulva   Cervical cancer ([place])    Colitis    ulcerative colitis   Depression    Endometriosis    GERD (gastroesophageal reflux disease)    Panic attack     Family History  Problem Relation Age of Onset   Diabetes Mother    Hyperlipidemia Mother    Heart attack Mother    Lung cancer Mother 68   Uterine cancer Mother    Cancer Father        prostate   Graves' disease Father    Depression Father    Anxiety disorder Father    Crohn's disease Brother    Dementia Maternal Aunt    Colon cancer Maternal Aunt 29       died at 62   Dementia Maternal Uncle    Dementia Paternal Grandmother    Diverticulitis Paternal Grandmother    Scleroderma Paternal Grandmother    Bipolar disorder Neg Hx    Schizophrenia Neg Hx    OCD Neg Hx    Alcohol abuse Neg Hx    Drug abuse Neg Hx    Esophageal cancer Neg Hx    Rectal cancer Neg Hx    Stomach cancer Neg Hx    Past Surgical History:  Procedure Laterality Date   DILATION AND CURETTAGE OF UTERUS     left ankle     WISDOM TOOTH EXTRACTION     Social History   Social History Narrative   Not on file   Immunization History  Administered Date(s)  Administered   Influenza Split 04/11/2012   Influenza,inj,Quad PF,6+ Mos 03/26/2017, 01/15/2018   PPD Test 10/05/2014   Tdap 11/28/2015     Objective: Vital Signs: BP (!) 153/89    Pulse (!) 102    Ht 5' 6.25" (1.683 m)    Wt 225 lb 12.8 oz (102.4 kg)    BMI 36.17 kg/m    Physical Exam Constitutional:      Appearance: She is obese.  HENT:     Right Ear: External ear normal.     Left Ear: External ear normal.     Mouth/Throat:     Mouth: Mucous membranes are moist.     Pharynx: Oropharynx is clear.  Eyes:     Conjunctiva/sclera: Conjunctivae normal.  Cardiovascular:     Rate and Rhythm:  Normal rate and regular rhythm.  Pulmonary:     Effort: Pulmonary effort is normal.     Breath sounds: Normal breath sounds.  Musculoskeletal:     Right lower leg: No edema.     Left lower leg: No edema.  Lymphadenopathy:     Cervical: No cervical adenopathy.  Skin:    General: Skin is warm and dry.     Comments: Telangiectatic rosacea on the face  Neurological:     General: No focal deficit present.     Mental Status: She is alert.     Deep Tendon Reflexes: Reflexes normal.  Psychiatric:        Mood and Affect: Mood normal.     Musculoskeletal Exam:  Neck full ROM no tenderness Shoulders full ROM no tenderness or swelling Elbows full ROM no tenderness or swelling Wrists full ROM no tenderness or swelling Fingers full ROM no tenderness or swelling Diffuse back tenderness to pressure worst in thoracic paraspinal muscles especially along medial borders of scapulae Knees full ROM no tenderness or swelling, mild patellofemoral crepitus on right knee Ankles full ROM no tenderness or swelling MTPs full ROM no tenderness or swelling   Investigation: No additional findings.  Imaging: No results found.  Recent Labs: Lab Results  Component Value Date   WBC 6.6 03/22/2021   HGB 16.3 (H) 03/22/2021   PLT 244 03/22/2021   NA 139 03/22/2021   K 4.9 03/22/2021   CL 102 03/22/2021    CO2 24 03/22/2021   GLUCOSE 100 (H) 03/22/2021   BUN 9 03/22/2021   CREATININE 0.89 03/22/2021   BILITOT 0.5 03/22/2021   ALKPHOS 102 11/28/2015   AST 21 03/22/2021   ALT 19 03/22/2021   PROT 7.3 03/22/2021   ALBUMIN 4.3 11/28/2015   CALCIUM 9.9 03/22/2021   GFRAA 82 10/14/2020    Speciality Comments: No specialty comments available.  Procedures:  No procedures performed Allergies: Bactrim [sulfamethoxazole-trimethoprim], Ambien [zolpidem], Paxil [paroxetine hcl], and Moxifloxacin   Assessment / Plan:     Visit Diagnoses: Positive ANA (antinuclear antibody) - Plan: Anti-scleroderma antibody, RNP Antibody, Anti-Smith antibody, Sjogrens syndrome-A extractable nuclear antibody, Anti-DNA antibody, double-stranded, C3 and C4  Positive ANA at a fairly low titer symptoms are numerous and longstanding but not very specific for clinical criteria.  We will check more specific antibody markers today.  My overall suspicion is less but would plan to follow-up regardless based on the numerous other ongoing symptoms.  I definitely do not see typical Raynaud's, nailfold capillary changes, or sclerodactyly signs at least some of which would be expected for scleroderma or mixed connective tissue disease.  Alopecia areata  Here and cutaneous changes job over this time along with the positive antibody markers.  Talus appears to be fairly stable right now there is no active inflammatory scalp changes on exam.  Polyarthralgia  Joint pain and to some extent all over body pains localizing to muscles as well.  Could be consistent with a more generalized chronic pain syndrome such as fibromyalgia if no additional underlying cause identified.  Generalized anxiety disorder  Not particularly in exacerbation at this time she is on venlafaxine and trazodone.  Orders: Orders Placed This Encounter  Procedures   Anti-scleroderma antibody   RNP Antibody   Anti-Smith antibody   Sjogrens syndrome-A  extractable nuclear antibody   Anti-DNA antibody, double-stranded   C3 and C4   No orders of the defined types were placed in this encounter.   Follow-Up Instructions: No follow-ups  on file.   Collier Salina, MD  Note - This record has been created using [employer].  Chart creation errors have been sought, but may not always  have been located. Such creation errors do not reflect on  the standard of medical care.

## 2021-06-26 ENCOUNTER — Encounter: Payer: Self-pay | Admitting: Internal Medicine

## 2021-06-26 ENCOUNTER — Ambulatory Visit (INDEPENDENT_AMBULATORY_CARE_PROVIDER_SITE_OTHER): Payer: BC Managed Care – PPO | Admitting: Internal Medicine

## 2021-06-26 ENCOUNTER — Other Ambulatory Visit: Payer: Self-pay

## 2021-06-26 VITALS — BP 153/89 | HR 102 | Ht 66.25 in | Wt 225.8 lb

## 2021-06-26 DIAGNOSIS — R768 Other specified abnormal immunological findings in serum: Secondary | ICD-10-CM

## 2021-06-26 DIAGNOSIS — L639 Alopecia areata, unspecified: Secondary | ICD-10-CM | POA: Diagnosis not present

## 2021-06-26 DIAGNOSIS — M2550 Pain in unspecified joint: Secondary | ICD-10-CM | POA: Diagnosis not present

## 2021-06-26 DIAGNOSIS — R7689 Other specified abnormal immunological findings in serum: Secondary | ICD-10-CM

## 2021-06-26 DIAGNOSIS — F411 Generalized anxiety disorder: Secondary | ICD-10-CM | POA: Diagnosis not present

## 2021-06-27 LAB — C3 AND C4
C3 Complement: 170 mg/dL (ref 83–193)
C4 Complement: 33 mg/dL (ref 15–57)

## 2021-06-27 LAB — SJOGRENS SYNDROME-A EXTRACTABLE NUCLEAR ANTIBODY: SSA (Ro) (ENA) Antibody, IgG: <1 AI

## 2021-06-27 LAB — RNP ANTIBODY: Ribonucleic Protein(ENA) Antibody, IgG: <1 AI

## 2021-06-27 LAB — ANTI-SCLERODERMA ANTIBODY: Scleroderma (Scl-70) (ENA) Antibody, IgG: <1 AI

## 2021-06-27 LAB — ANTI-DNA ANTIBODY, DOUBLE-STRANDED: ds DNA Ab: <1 IU/mL

## 2021-06-27 LAB — ANTI-SMITH ANTIBODY: ENA SM Ab Ser-aCnc: <1 AI

## 2021-07-12 ENCOUNTER — Telehealth: Payer: Self-pay | Admitting: Family Medicine

## 2021-07-12 DIAGNOSIS — Z1239 Encounter for other screening for malignant neoplasm of breast: Secondary | ICD-10-CM

## 2021-07-12 NOTE — Telephone Encounter (Signed)
Please call patient and remind her to schedule her mammogram as soon as she is able to it can be done here in our building or if she would prefer to do it through [facility] that is perfectly fine but being that she is on hormone replacement with her Vivelle-Dot she absolutely has to get her mammogram done yearly and the last 1 was about 4 years ago.

## 2021-07-14 NOTE — Telephone Encounter (Signed)
Please contact patient, advise her that she is due for a mammogram since she is on a hormone replacement. Confirm if patient would like to go to imaging downstairs or [facility] for her mammogram. Thanks in advance!

## 2021-07-14 NOTE — Telephone Encounter (Signed)
Patient said she would like to go to imaging downstairs.

## 2021-07-17 NOTE — Telephone Encounter (Signed)
Task completed. Location of screening changed to [place].

## 2021-07-17 NOTE — Addendum Note (Signed)
Addended by: Mertha Finders on: 07/17/2021 11:12 AM   Modules accepted: Orders

## 2021-07-17 NOTE — Progress Notes (Unsigned)
Office Visit Note  Patient: Virginia Logan             Date of Birth: Mar 27, 1971           MRN: [id number]             PCP: Hali Marry, MD Referring: Hali Marry, * Visit Date: 07/18/2021   Subjective:  No chief complaint on file.   History of Present Illness: Virginia Logan is a 51 y.o. female here for follow up for positive ANA and multiple symptoms specific antibody markers checked at initial visit were negative for specific antibody markers. ***   Previous HPI 06/26/21 Virginia Logan is a 51 y.o. female here for evaluation of positive ANA checked in association with multiple symptoms including extreme fatigue, joint pain in many areas, brain fogginess with impaired concentration and short-term recall, episodic skin rashes, and more recently abnormal uterine bleeding, erythrocytosis, and alopecia.  She states symptoms started about 9 years ago after a severe allergic reaction to Bactrim requiring hospitalization for anaphylaxis.  After she was treated for this she felt symptoms started with episodes of generalized fatigue body aches sometimes rashes these would affect her for about 1 day out of a month.  Over time the duration frequency and severity of symptoms has worsened and now she feels there are more bad days than good.  She does not typically know of any provoking exposure or incidents. Fatigue is very bad regardless of physical activity. She takes trazodone for sleep and reports needing to nap for lunch break about 80% of work days. She doesn't notice any particular night time awakenings or disruptions. She has never had a sleep study. Body pains do not localize to specific joints typically more generalized symptoms or in her muscles. Most often affected in her back the mid to upper back is the worst. She notices swelling around her ankles that comes and goes. She broke her left ankle many years ago with surgical internal fixation and has sometimes problems with  this including plantar fasciitis in the past. She feels great when taking prednisone.  She has not had much improvement with oral NSAIDs.  Back spasm pain has improved with muscle relaxants in the past. She has some chronic erythema and flushing with rosacea type skin changes on the face.  She has had a couple episodes of skin rash breaking out on her torso under her arms and around breasts.  Last year was having some rash breaking out around her vulvar area this was biopsied with unclear cause and resolved with topical steroid treatment.  She notices pale or bluish discoloration of fingertips with cold exposure no pitting, ulcers or peeling. She had some episodes of very painful burning or stinging eye irritation but does not have any chronic dryness or redness.  She is nearsighted but does not follow-up frequently with eye exams. 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 She was also treated for H. Pylori after that time. She is not on any long-term maintenance or routine GI follow-up.   She has some positive family history grandmother with scleroderma, father with graves disease, brother with crohns disease.   She previously saw rheumatology about 5-6 years ago for ongoing symptoms no specific diagnosis made at that time and did not follow up long term.   No Rheumatology ROS completed.   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   Generalized anxiety disorder 07/08/2013   Depression with anxiety 07/24/2012   Endometriosis  08/31/2010   ASTHMA UNSPECIFIED WITH EXACERBATION 11/10/2009    Past Medical History:  Diagnosis Date   Alopecia    Anemia    Anxiety    Cancer ([facility])    cervical cancer, melanoma of vulva   Cervical cancer ([place])    Colitis    ulcerative colitis   Depression    Endometriosis    GERD (gastroesophageal reflux disease)    Panic attack     Family History  Problem Relation Age of Onset   Diabetes Mother    Hyperlipidemia Mother    Heart attack Mother    Lung cancer Mother 69   Uterine cancer Mother    Cancer Father        prostate   Graves' disease Father    Depression Father    Anxiety disorder Father    Crohn's disease Brother    Dementia Maternal Aunt    Colon cancer Maternal Aunt 33       died at 50   Dementia Maternal Uncle    Dementia Paternal Grandmother    Diverticulitis Paternal Grandmother    Scleroderma Paternal Grandmother    Bipolar disorder Neg Hx    Schizophrenia Neg Hx    OCD Neg Hx    Alcohol abuse Neg Hx    Drug abuse Neg Hx    Esophageal cancer Neg Hx    Rectal cancer Neg Hx    Stomach cancer Neg Hx    Past Surgical History:  Procedure Laterality Date   DILATION AND CURETTAGE OF UTERUS     left ankle     WISDOM TOOTH EXTRACTION     Social History   Social History Narrative   Not on file   Immunization History  Administered Date(s) Administered   Influenza Split 04/11/2012   Influenza,inj,Quad PF,6+ Mos 03/26/2017, 01/15/2018   PPD Test 10/05/2014   Tdap 11/28/2015     Objective: Vital Signs: There were no vitals taken for this visit.   Physical Exam   Musculoskeletal Exam: ***  CDAI Exam: CDAI Score: -- Patient Global: --; Provider Global: -- Swollen: --; Tender: -- Joint Exam 07/18/2021   No joint exam has been documented for this visit   There is currently no information documented on the homunculus. Go to the Rheumatology activity and complete the homunculus joint exam.  Investigation: No additional  findings.  Imaging: No results found.  Recent Labs: Lab Results  Component Value Date   WBC 6.6 03/22/2021   HGB 16.3 (H) 03/22/2021   PLT 244 03/22/2021   NA 139 03/22/2021   K 4.9 03/22/2021   CL 102 03/22/2021   CO2 24 03/22/2021   GLUCOSE 100 (H) 03/22/2021   BUN 9 03/22/2021   CREATININE 0.89 03/22/2021   BILITOT 0.5 03/22/2021   ALKPHOS 102 11/28/2015   AST 21 03/22/2021   ALT 19 03/22/2021   PROT 7.3 03/22/2021   ALBUMIN 4.3 11/28/2015   CALCIUM 9.9 03/22/2021   GFRAA 82 10/14/2020    Speciality Comments: No specialty comments available.  Procedures:  No procedures performed Allergies: Bactrim [  sulfamethoxazole-trimethoprim], Ambien [zolpidem], Paxil [paroxetine hcl], and Moxifloxacin   Assessment / Plan:     Visit Diagnoses: No diagnosis found.  ***  Orders: No orders of the defined types were placed in this encounter.  No orders of the defined types were placed in this encounter.    Follow-Up Instructions: No follow-ups on file.   Collier Salina, MD  Note - This record has been created using [employer].  Chart creation errors have been sought, but may not always  have been located. Such creation errors do not reflect on  the standard of medical care.

## 2021-07-18 ENCOUNTER — Ambulatory Visit: Payer: 59 | Admitting: Internal Medicine

## 2021-07-20 ENCOUNTER — Encounter: Payer: Self-pay | Admitting: Internal Medicine

## 2021-07-31 ENCOUNTER — Encounter: Payer: Self-pay | Admitting: Family Medicine

## 2021-07-31 NOTE — Telephone Encounter (Signed)
I think she needs to still try to follow-up with Dr. Benjamine Mola to go over everything together and see if he has any other thoughts or recommendations. ?

## 2021-08-02 ENCOUNTER — Encounter: Payer: Self-pay | Admitting: Family Medicine

## 2021-08-06 ENCOUNTER — Other Ambulatory Visit: Payer: Self-pay | Admitting: Family Medicine

## 2021-09-18 ENCOUNTER — Other Ambulatory Visit: Payer: Self-pay | Admitting: Family Medicine

## 2021-10-12 DIAGNOSIS — Z713 Dietary counseling and surveillance: Secondary | ICD-10-CM | POA: Diagnosis not present

## 2021-10-12 DIAGNOSIS — F3289 Other specified depressive episodes: Secondary | ICD-10-CM | POA: Diagnosis not present

## 2021-10-12 DIAGNOSIS — Z7182 Exercise counseling: Secondary | ICD-10-CM | POA: Diagnosis not present

## 2021-10-12 DIAGNOSIS — Z833 Family history of diabetes mellitus: Secondary | ICD-10-CM | POA: Diagnosis not present

## 2021-10-12 DIAGNOSIS — Z6837 Body mass index (BMI) 37.0-37.9, adult: Secondary | ICD-10-CM | POA: Diagnosis not present

## 2021-10-12 DIAGNOSIS — T733XXA Exhaustion due to excessive exertion, initial encounter: Secondary | ICD-10-CM | POA: Diagnosis not present

## 2021-10-18 DIAGNOSIS — K5909 Other constipation: Secondary | ICD-10-CM | POA: Diagnosis not present

## 2021-10-18 DIAGNOSIS — Z713 Dietary counseling and surveillance: Secondary | ICD-10-CM | POA: Diagnosis not present

## 2021-10-18 DIAGNOSIS — Z6836 Body mass index (BMI) 36.0-36.9, adult: Secondary | ICD-10-CM | POA: Diagnosis not present

## 2021-10-18 DIAGNOSIS — E6609 Other obesity due to excess calories: Secondary | ICD-10-CM | POA: Diagnosis not present

## 2021-10-24 DIAGNOSIS — F330 Major depressive disorder, recurrent, mild: Secondary | ICD-10-CM | POA: Diagnosis not present

## 2021-11-07 DIAGNOSIS — F330 Major depressive disorder, recurrent, mild: Secondary | ICD-10-CM | POA: Diagnosis not present

## 2021-12-17 ENCOUNTER — Other Ambulatory Visit: Payer: Self-pay | Admitting: Family Medicine

## 2021-12-20 DIAGNOSIS — E559 Vitamin D deficiency, unspecified: Secondary | ICD-10-CM | POA: Diagnosis not present

## 2021-12-20 DIAGNOSIS — R799 Abnormal finding of blood chemistry, unspecified: Secondary | ICD-10-CM | POA: Diagnosis not present

## 2021-12-20 DIAGNOSIS — N951 Menopausal and female climacteric states: Secondary | ICD-10-CM | POA: Diagnosis not present

## 2021-12-20 DIAGNOSIS — E538 Deficiency of other specified B group vitamins: Secondary | ICD-10-CM | POA: Diagnosis not present

## 2021-12-20 DIAGNOSIS — E039 Hypothyroidism, unspecified: Secondary | ICD-10-CM | POA: Diagnosis not present

## 2021-12-20 DIAGNOSIS — R5383 Other fatigue: Secondary | ICD-10-CM | POA: Diagnosis not present

## 2022-01-13 ENCOUNTER — Other Ambulatory Visit: Payer: Self-pay | Admitting: Family Medicine

## 2022-04-09 DIAGNOSIS — Z1151 Encounter for screening for human papillomavirus (HPV): Secondary | ICD-10-CM | POA: Diagnosis not present

## 2022-04-09 DIAGNOSIS — Z01419 Encounter for gynecological examination (general) (routine) without abnormal findings: Secondary | ICD-10-CM | POA: Diagnosis not present

## 2022-04-09 DIAGNOSIS — Z01411 Encounter for gynecological examination (general) (routine) with abnormal findings: Secondary | ICD-10-CM | POA: Diagnosis not present

## 2022-04-09 DIAGNOSIS — N9089 Other specified noninflammatory disorders of vulva and perineum: Secondary | ICD-10-CM | POA: Diagnosis not present

## 2022-04-09 DIAGNOSIS — N7689 Other specified inflammation of vagina and vulva: Secondary | ICD-10-CM | POA: Diagnosis not present

## 2022-04-09 DIAGNOSIS — Z87412 Personal history of vulvar dysplasia: Secondary | ICD-10-CM | POA: Diagnosis not present

## 2022-04-09 DIAGNOSIS — N903 Dysplasia of vulva, unspecified: Secondary | ICD-10-CM | POA: Diagnosis not present

## 2022-04-09 LAB — RESULTS CONSOLE HPV: CHL HPV: NEGATIVE

## 2022-04-09 LAB — HM PAP SMEAR: HM Pap smear: NEGATIVE

## 2022-04-16 DIAGNOSIS — N903 Dysplasia of vulva, unspecified: Secondary | ICD-10-CM | POA: Diagnosis not present

## 2022-04-16 DIAGNOSIS — N904 Leukoplakia of vulva: Secondary | ICD-10-CM | POA: Diagnosis not present

## 2022-04-16 DIAGNOSIS — Z8619 Personal history of other infectious and parasitic diseases: Secondary | ICD-10-CM | POA: Diagnosis not present

## 2022-04-16 DIAGNOSIS — Z872 Personal history of diseases of the skin and subcutaneous tissue: Secondary | ICD-10-CM | POA: Diagnosis not present

## 2022-04-16 DIAGNOSIS — N9089 Other specified noninflammatory disorders of vulva and perineum: Secondary | ICD-10-CM | POA: Diagnosis not present

## 2022-04-16 DIAGNOSIS — Z87410 Personal history of cervical dysplasia: Secondary | ICD-10-CM | POA: Diagnosis not present

## 2022-04-16 DIAGNOSIS — L900 Lichen sclerosus et atrophicus: Secondary | ICD-10-CM | POA: Diagnosis not present

## 2022-05-03 ENCOUNTER — Telehealth: Payer: BC Managed Care – PPO | Admitting: Family Medicine

## 2022-05-03 DIAGNOSIS — J069 Acute upper respiratory infection, unspecified: Secondary | ICD-10-CM

## 2022-05-03 MED ORDER — BENZONATATE 100 MG PO CAPS: 100.0000 mg | ORAL_CAPSULE | Freq: Two times a day (BID) | ORAL | 0 refills | Status: DC | PRN

## 2022-05-03 MED ORDER — PROMETHAZINE-DM 6.25-15 MG/5ML PO SYRP: 5.0000 mL | ORAL_SOLUTION | Freq: Three times a day (TID) | ORAL | 0 refills | Status: DC | PRN

## 2022-05-03 MED ORDER — ALBUTEROL SULFATE HFA 108 (90 BASE) MCG/ACT IN AERS: 2.0000 | INHALATION_SPRAY | Freq: Four times a day (QID) | RESPIRATORY_TRACT | 0 refills | Status: AC | PRN

## 2022-05-03 MED ORDER — FLUTICASONE PROPIONATE 50 MCG/ACT NA SUSP: 2.0000 | Freq: Every day | NASAL | 0 refills | Status: AC

## 2022-05-03 NOTE — Progress Notes (Signed)

## 2022-05-03 NOTE — Addendum Note (Signed)
Addended by: Perlie Mayo on: 05/03/2022 08:49 AM   Modules accepted: Orders

## 2022-05-07 DIAGNOSIS — N904 Leukoplakia of vulva: Secondary | ICD-10-CM | POA: Diagnosis not present

## 2022-07-07 ENCOUNTER — Other Ambulatory Visit: Payer: Self-pay | Admitting: Family Medicine

## 2022-07-11 ENCOUNTER — Other Ambulatory Visit: Payer: Self-pay | Admitting: Family Medicine

## 2022-07-12 ENCOUNTER — Other Ambulatory Visit: Payer: Self-pay | Admitting: Family Medicine

## 2022-07-16 ENCOUNTER — Other Ambulatory Visit: Payer: Self-pay | Admitting: Family Medicine

## 2022-08-07 ENCOUNTER — Other Ambulatory Visit: Payer: Self-pay | Admitting: Family Medicine

## 2022-08-07 MED ORDER — TRAZODONE HCL 100 MG PO TABS: 100.0000 mg | ORAL_TABLET | Freq: Every evening | ORAL | 0 refills | Status: DC | PRN

## 2022-08-07 NOTE — Telephone Encounter (Signed)
Appointment needed to refill Trazodone

## 2022-08-07 NOTE — Telephone Encounter (Signed)
Called patient she is scheduled for 08-10-22

## 2022-08-10 ENCOUNTER — Ambulatory Visit: Payer: BC Managed Care – PPO | Admitting: Family Medicine

## 2022-08-10 ENCOUNTER — Encounter: Payer: Self-pay | Admitting: Family Medicine

## 2022-08-10 VITALS — BP 106/63 | HR 77 | Ht 64.0 in | Wt 186.0 lb

## 2022-08-10 DIAGNOSIS — F5102 Adjustment insomnia: Secondary | ICD-10-CM | POA: Diagnosis not present

## 2022-08-10 DIAGNOSIS — F418 Other specified anxiety disorders: Secondary | ICD-10-CM | POA: Diagnosis not present

## 2022-08-10 DIAGNOSIS — Z1231 Encounter for screening mammogram for malignant neoplasm of breast: Secondary | ICD-10-CM | POA: Diagnosis not present

## 2022-08-10 DIAGNOSIS — N9089 Other specified noninflammatory disorders of vulva and perineum: Secondary | ICD-10-CM | POA: Diagnosis not present

## 2022-08-10 MED ORDER — TRAZODONE HCL 100 MG PO TABS: 100.0000 mg | ORAL_TABLET | Freq: Every evening | ORAL | 3 refills | Status: DC | PRN

## 2022-08-10 NOTE — Progress Notes (Signed)
Established Patient Office Visit  Subjective   Patient ID: Virginia Logan, female    DOB: 1971/04/07  Age: 52 y.o. MRN: [id number]  Chief Complaint  Patient presents with   Insomnia   mood    HPI   F/U depression with anxiety -she is actually doing really really well overall in fact she wonders about the venlafaxine because she just like she was in a much better place and has done great.  F/U insomnia -uses the trazodone every night for sleep she says that she does not take it she does not sleep at all.  Did wean herself off of her hormones and feels like she is also in a good place and doing well.  She is due for her mammogram.  She just had her Pap smear so we will get her chart updated.  She is still working from home and doing really well with that.  She goes to church to really connect socially.  She did start seeing a nurse practitioner who does integrative medicine.  She was found to have some low vitamin D, magnesium and B vitamin levels so she is now on supplementation for that.  She also started on Ozempic and has been doing well she has been losing some weight particularly in the abdomen she has been exercising some but plans on getting into the gym.  She also recently had a vulvar biopsy came back as lichen sclerosis again and no cancer cells which is great.  They went ahead and did a Pap smear while she was there.    ROS    Objective:     BP 106/63   Pulse 77   Ht 5\' 4"  (1.626 m)   Wt 186 lb (84.4 kg)   SpO2 97%   BMI 31.93 kg/m    Physical Exam Vitals and nursing note reviewed.  Constitutional:      Appearance: She is well-developed.  HENT:     Head: Normocephalic and atraumatic.  Cardiovascular:     Rate and Rhythm: Normal rate and regular rhythm.     Heart sounds: Normal heart sounds.  Pulmonary:     Effort: Pulmonary effort is normal.     Breath sounds: Normal breath sounds.     Comments: Slight expiratory wheeze in the right upper lobe  that improved after deep cough. Skin:    General: Skin is warm and dry.  Neurological:     Mental Status: She is alert and oriented to person, place, and time.  Psychiatric:        Behavior: Behavior normal.      Results for orders placed or performed in visit on 08/10/22  HM PAP SMEAR  Result Value Ref Range   HM Pap smear negative   Results Console HPV  Result Value Ref Range   CHL HPV Negative       The 10-year ASCVD risk score (Arnett DK, et al., 2019) is: 3.3%    Assessment & Plan:   Problem List Items Addressed This Visit       Genitourinary   Vulvar lesion    Negative for cancer which is very reassuring.        Other   Insomnia    Will refill the trazodone.  Continue current regimen.  Follow-up in 1 year.      Relevant Medications   traZODone (DESYREL) 100 MG tablet   Depression with anxiety - Primary    Dems are currently really well-controlled off of  medication so we will just continue to monitor.      Relevant Medications   traZODone (DESYREL) 100 MG tablet   Other Visit Diagnoses     Screening mammogram for breast cancer       Relevant Orders   MM 3D SCREENING MAMMOGRAM BILATERAL BREAST      Did encourage her to get her mammogram updated last 1 was about 6 years ago.  Has been having some allergies lately and is taking some Allegra.  Return in about 1 year (around 08/10/2023) for Wellness Exam and sleep .    Beatrice Lecher, MD

## 2022-08-10 NOTE — Assessment & Plan Note (Signed)
Dems are currently really well-controlled off of medication so we will just continue to monitor.

## 2022-08-10 NOTE — Assessment & Plan Note (Signed)
Will refill the trazodone.  Continue current regimen.  Follow-up in 1 year.

## 2022-08-10 NOTE — Assessment & Plan Note (Signed)
Negative for cancer which is very reassuring.

## 2023-09-02 ENCOUNTER — Other Ambulatory Visit: Payer: Self-pay | Admitting: Family Medicine

## 2023-09-02 DIAGNOSIS — F5102 Adjustment insomnia: Secondary | ICD-10-CM

## 2023-09-23 DIAGNOSIS — B2700 Gammaherpesviral mononucleosis without complication: Secondary | ICD-10-CM | POA: Diagnosis not present

## 2023-09-23 DIAGNOSIS — R5382 Chronic fatigue, unspecified: Secondary | ICD-10-CM | POA: Diagnosis not present

## 2023-09-23 DIAGNOSIS — E039 Hypothyroidism, unspecified: Secondary | ICD-10-CM | POA: Diagnosis not present

## 2023-09-23 DIAGNOSIS — R799 Abnormal finding of blood chemistry, unspecified: Secondary | ICD-10-CM | POA: Diagnosis not present

## 2023-10-14 ENCOUNTER — Other Ambulatory Visit: Payer: Self-pay | Admitting: Family Medicine

## 2023-10-14 DIAGNOSIS — F5102 Adjustment insomnia: Secondary | ICD-10-CM

## 2023-10-22 ENCOUNTER — Other Ambulatory Visit: Payer: Self-pay | Admitting: Family Medicine

## 2023-10-22 DIAGNOSIS — F5102 Adjustment insomnia: Secondary | ICD-10-CM

## 2023-10-26 ENCOUNTER — Other Ambulatory Visit: Payer: Self-pay | Admitting: Family Medicine

## 2023-10-26 DIAGNOSIS — F5102 Adjustment insomnia: Secondary | ICD-10-CM

## 2023-10-28 NOTE — Telephone Encounter (Signed)
 Patient scheduled for 12/05/23, patient would like to know if she could refill until her appt on 12/05/23, thanks.

## 2023-10-28 NOTE — Telephone Encounter (Signed)
 Pls contact pt to schedule annual appt with Dr. Greer Leak. Past due from March 2025. Thx

## 2023-10-31 ENCOUNTER — Other Ambulatory Visit: Payer: Self-pay | Admitting: Family Medicine

## 2023-10-31 DIAGNOSIS — F5102 Adjustment insomnia: Secondary | ICD-10-CM

## 2023-11-28 DIAGNOSIS — Z01419 Encounter for gynecological examination (general) (routine) without abnormal findings: Secondary | ICD-10-CM | POA: Diagnosis not present

## 2023-11-28 DIAGNOSIS — Z1151 Encounter for screening for human papillomavirus (HPV): Secondary | ICD-10-CM | POA: Diagnosis not present

## 2023-12-05 ENCOUNTER — Ambulatory Visit: Admitting: Family Medicine

## 2023-12-30 ENCOUNTER — Ambulatory Visit: Admission: EM | Admit: 2023-12-30 | Discharge: 2023-12-30 | Disposition: A | Discharge status: Home / Self Care

## 2023-12-30 ENCOUNTER — Encounter: Payer: Self-pay | Admitting: Emergency Medicine

## 2023-12-30 DIAGNOSIS — W57XXXA Bitten or stung by nonvenomous insect and other nonvenomous arthropods, initial encounter: Secondary | ICD-10-CM

## 2023-12-30 DIAGNOSIS — R21 Rash and other nonspecific skin eruption: Secondary | ICD-10-CM

## 2023-12-30 MED ORDER — DOXYCYCLINE HYCLATE 100 MG PO CAPS: 100.0000 mg | ORAL_CAPSULE | Freq: Two times a day (BID) | ORAL | 0 refills | Status: AC

## 2023-12-30 NOTE — ED Provider Notes (Signed)
 [facility]    CSN: [id number] Arrival date & time: 12/30/23  1025      History   Chief Complaint Chief Complaint  Patient presents with   Insect Bite    HPI Virginia Logan is a 53 y.o. female.   HPI 53 year old female presents with spider bite on stomach 4 days ago.  Patient reports it is red, pruritic, and spreading in size.  PMH significant for cervical cancer/melanoma of vulva, UC, and polyarthralgia.  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   Vulvar lesion 10/14/2020   Post-menopausal bleeding 10/27/2018   Urge incontinence 10/27/2018   Insomnia 08/23/2017   Hair loss 11/30/2015   Alopecia areata 11/30/2015   Panic disorder 07/31/2013   Depression, major, single episode, moderate (HCC) 07/08/2013   Generalized anxiety disorder 07/08/2013   Depression with anxiety 07/24/2012   Endometriosis 08/31/2010   Asthma with exacerbation 11/10/2009    Past Surgical History:  Procedure Laterality Date   DILATION AND CURETTAGE OF UTERUS     left ankle     TOTAL LAPAROSCOPIC HYSTERECTOMY WITH BILATERAL SALPINGO OOPHORECTOMY Bilateral 05/2021   WISDOM TOOTH EXTRACTION      OB History     Gravida  3   Para  1   Term      Preterm      AB  2   Living  1      SAB  2   IAB      Ectopic      Multiple      Live Births               Home Medications    Prior to Admission medications   Medication Sig Start Date End Date Taking? Authorizing Provider  albuterol  (VENTOLIN  HFA) 108 (90 Base) MCG/ACT inhaler Inhale 2  puffs into the lungs every 6 (six) hours as needed for wheezing or shortness of breath. 05/03/22  Yes Moishe Chiquita HERO, NP  doxycycline  (VIBRAMYCIN ) 100 MG capsule Take 1 capsule (100 mg total) by mouth 2 (two) times daily for 10 days. 12/30/23 01/09/24 Yes Teddy Sharper, FNP  estradiol  (ESTRACE ) 0.1 MG/GM vaginal cream Place 1 Applicatorful vaginally once a week. 11/28/23  Yes [provider]  estradiol  (VIVELLE -DOT) 0.05 MG/24HR patch Place 1 patch onto the skin 2 (two) times a week. 12/19/23  Yes [provider]  mirabegron ER (MYRBETRIQ) 25 MG TB24 tablet Take 25 mg by mouth daily. 11/28/23  Yes [provider]  Tirzepatide (MOUNJARO [place]) Inject into the skin.   Yes [provider]  traZODone  (DESYREL ) 100 MG tablet Take 1 tablet (100 mg total) by mouth at bedtime as needed. for sleep. LAST REFILL UNTIL PATIENT IS SEEN ON 12/05/2023 11/01/23  Yes Alvan Dorothyann BIRCH, MD  fluticasone  (FLONASE ) 50 MCG/ACT nasal spray Place 2 sprays into both nostrils daily. Patient taking differently: Place 2 sprays into both nostrils as needed for allergies. 05/03/22   Moishe Chiquita HERO, NP  Family History Family History  Problem Relation Age of Onset   Diabetes Mother    Hyperlipidemia Mother    Heart attack Mother    Lung cancer Mother 36   Uterine cancer Mother    Cancer Father        prostate   Graves' disease Father    Depression Father    Anxiety disorder Father    Crohn's disease Brother    Dementia Maternal Aunt    Colon cancer Maternal Aunt 104       died at 36   Dementia Maternal Uncle    Dementia Paternal Grandmother    Diverticulitis Paternal Grandmother    Scleroderma Paternal Grandmother    Bipolar disorder Neg Hx    Schizophrenia Neg Hx    OCD Neg Hx    Alcohol abuse Neg Hx    Drug abuse Neg Hx    Esophageal cancer Neg Hx    Rectal cancer Neg Hx    Stomach cancer Neg Hx     Social History Social History   Tobacco Use   Smoking status: Every Day     Current packs/day: 0.00    Types: Cigarettes    Last attempt to quit: 08/20/2013    Years since quitting: 10.3   Smokeless tobacco: Never  Vaping Use   Vaping status: Never Used  Substance Use Topics   Alcohol use: No   Drug use: No    Comment: Caffeine: Coffee 2-3 cupsper day.      Allergies   Bactrim [sulfamethoxazole-trimethoprim], Ambien [zolpidem], Paxil  [paroxetine  hcl], and Moxifloxacin   Review of Systems Review of Systems  Skin:  Positive for rash.  All other systems reviewed and are negative.    Physical Exam Triage Vital Signs ED Triage Vitals  Encounter Vitals Group     BP 12/30/23 1045 105/70     Girls Systolic BP Percentile --      Girls Diastolic BP Percentile --      Boys Systolic BP Percentile --      Boys Diastolic BP Percentile --      Pulse Rate 12/30/23 1045 62     Resp 12/30/23 1045 18     Temp 12/30/23 1045 98.2 F (36.8 C)     Temp Source 12/30/23 1045 Oral     SpO2 12/30/23 1045 98 %     Weight 12/30/23 1047 204 lb (92.5 kg)     Height 12/30/23 1047 5' 5 (1.651 m)     Head Circumference --      Peak Flow --      Pain Score 12/30/23 1047 0     Pain Loc --      Pain Education --      Exclude from Growth Chart --    No data found.  Updated Vital Signs BP 105/70 (BP Location: Right Arm)   Pulse 62   Temp 98.2 F (36.8 C) (Oral)   Resp 18   Ht 5' 5 (1.651 m)   Wt 204 lb (92.5 kg)   SpO2 98%   BMI 33.95 kg/m   Visual Acuity Right Eye Distance:   Left Eye Distance:   Bilateral Distance:    Right Eye Near:   Left Eye Near:    Bilateral Near:     Physical Exam Vitals and nursing note reviewed.  Constitutional:      Appearance: Normal appearance. She is normal weight.  HENT:     Head: Normocephalic and atraumatic.  Mouth/Throat:     Mouth: Mucous membranes are moist.     Pharynx: Oropharynx is clear.  Eyes:     Extraocular Movements: Extraocular movements intact.     Conjunctiva/sclera: Conjunctivae normal.      Pupils: Pupils are equal, round, and reactive to light.  Cardiovascular:     Rate and Rhythm: Normal rate and regular rhythm.     Pulses: Normal pulses.     Heart sounds: Normal heart sounds.  Pulmonary:     Effort: Pulmonary effort is normal.     Breath sounds: Normal breath sounds. No wheezing, rhonchi or rales.  Musculoskeletal:        General: Normal range of motion.  Skin:    General: Skin is warm and dry.     Comments: Left lower quadrant area:~8.0 cm x 4.0 cm ovoid shaped area of erythema, mildly pruritic per patient please see image below  Neurological:     General: No focal deficit present.     Mental Status: She is alert and oriented to person, place, and time. Mental status is at baseline.  Psychiatric:        Mood and Affect: Mood normal.        Behavior: Behavior normal.      UC Treatments / Results  Labs (all labs ordered are listed, but only abnormal results are displayed) Labs Reviewed - No data to display  EKG   Radiology No results found.  Procedures Procedures (including critical care time)  Medications Ordered in UC Medications - No data to display  Initial Impression / Assessment and Plan / UC Course  I have reviewed the triage vital signs and the nursing notes.  Pertinent labs & imaging results that were available during my care of the patient were reviewed by me and considered in my medical decision making (see chart for details).     MDM: 1.  Rash and nonspecific skin eruption-presumably from insect bite, Rx'd doxycycline  100 mg capsule: Take 1 capsule twice daily x 10 days; 2.  Bug bite with infection, initial encounter-Rx'd doxycycline  100 mg capsule: Take 1 capsule twice daily x 10 days. Advised patient to take medication as directed with food to completion.  Encouraged to increase daily water intake to 64 ounces per day while taking this medication.  Advised if symptoms worsen and/or unresolved please follow with your PCP for further  evaluation.  Final Clinical Impressions(s) / UC Diagnoses   Final diagnoses:  Bug bite with infection, initial encounter  Rash and nonspecific skin eruption     Discharge Instructions      Advised patient to take medication as directed with food to completion.  Encouraged to increase daily water intake to 64 ounces per day while taking this medication.  Advised if symptoms worsen and/or unresolved please follow with your PCP for further evaluation.     ED Prescriptions     Medication Sig Dispense Auth. Provider   doxycycline  (VIBRAMYCIN ) 100 MG capsule Take 1 capsule (100 mg total) by mouth 2 (two) times daily for 10 days. 20 capsule Dallana Mavity, FNP      PDMP not reviewed this encounter.   Teddy Sharper, FNP 12/30/23 1112

## 2023-12-30 NOTE — Discharge Instructions (Addendum)
 Advised patient to take medication as directed with food to completion.  Encouraged to increase daily water intake to 64 ounces per day while taking this medication.  Advised if symptoms worsen and/or unresolved please follow with your PCP for further evaluation.

## 2023-12-30 NOTE — ED Triage Notes (Signed)
 Patient c/o spider bite on stomach x 4 days ago.  The area is red, itching, spreading in size.  Patient has been applying Hydrocortisone cream and antibiotic ointment.  Afebrile.

## 2024-02-03 DIAGNOSIS — L900 Lichen sclerosus et atrophicus: Secondary | ICD-10-CM | POA: Diagnosis not present

## 2024-02-03 DIAGNOSIS — Z780 Asymptomatic menopausal state: Secondary | ICD-10-CM | POA: Diagnosis not present

## 2024-02-21 ENCOUNTER — Other Ambulatory Visit: Payer: Self-pay | Admitting: Family Medicine

## 2024-02-21 DIAGNOSIS — F5102 Adjustment insomnia: Secondary | ICD-10-CM

## 2024-02-21 NOTE — Telephone Encounter (Signed)
 Please call pt she will need a f/u appointment for refills. Thank you.

## 2024-02-21 NOTE — Telephone Encounter (Signed)
 Called patient she stated that she is no longer getting this medication from Dr. Alvan she is receiving from another provider she is going to call the pharmacy and get it corrected did not make an appt

## 2024-06-14 ENCOUNTER — Other Ambulatory Visit: Payer: Self-pay | Admitting: Family Medicine

## 2024-06-14 DIAGNOSIS — F5102 Adjustment insomnia: Secondary | ICD-10-CM

## 2024-06-15 ENCOUNTER — Other Ambulatory Visit: Payer: Self-pay | Admitting: Family Medicine

## 2024-06-15 DIAGNOSIS — F5102 Adjustment insomnia: Secondary | ICD-10-CM
